# Patient Record
Sex: Male | Born: 1950 | Race: White | Hispanic: No | Marital: Married | Smoking: Former smoker
Health system: Southern US, Community
[De-identification: ages and names within clinical notes are randomized; demographics above are authoritative.]

## PROBLEM LIST (undated history)

## (undated) DIAGNOSIS — E119 Type 2 diabetes mellitus without complications: Secondary | ICD-10-CM

## (undated) DIAGNOSIS — E6609 Other obesity due to excess calories: Secondary | ICD-10-CM

## (undated) DIAGNOSIS — E785 Hyperlipidemia, unspecified: Secondary | ICD-10-CM

## (undated) DIAGNOSIS — N289 Disorder of kidney and ureter, unspecified: Secondary | ICD-10-CM

## (undated) DIAGNOSIS — I2510 Atherosclerotic heart disease of native coronary artery without angina pectoris: Secondary | ICD-10-CM

## (undated) DIAGNOSIS — F32A Depression, unspecified: Secondary | ICD-10-CM

## (undated) DIAGNOSIS — I10 Essential (primary) hypertension: Secondary | ICD-10-CM

## (undated) DIAGNOSIS — J309 Allergic rhinitis, unspecified: Secondary | ICD-10-CM

## (undated) DIAGNOSIS — Z125 Encounter for screening for malignant neoplasm of prostate: Secondary | ICD-10-CM

## (undated) DIAGNOSIS — I480 Paroxysmal atrial fibrillation: Secondary | ICD-10-CM

## (undated) DIAGNOSIS — F329 Major depressive disorder, single episode, unspecified: Secondary | ICD-10-CM

## (undated) HISTORY — PX: APPENDECTOMY: SHX54

## (undated) HISTORY — DX: Type 2 diabetes mellitus without complications: E11.9

## (undated) HISTORY — DX: Disorder of kidney and ureter, unspecified: N28.9

## (undated) HISTORY — DX: Essential (primary) hypertension: I10

## (undated) HISTORY — DX: Other obesity due to excess calories: E66.09

## (undated) HISTORY — DX: Hyperlipidemia, unspecified: E78.5

## (undated) HISTORY — DX: Atherosclerotic heart disease of native coronary artery without angina pectoris: I25.10

## (undated) HISTORY — DX: Encounter for screening for malignant neoplasm of prostate: Z12.5

## (undated) HISTORY — DX: Major depressive disorder, single episode, unspecified: F32.9

## (undated) HISTORY — PX: COLONOSCOPY WITH ESOPHAGOGASTRODUODENOSCOPY (EGD): SHX5779

## (undated) HISTORY — DX: Allergic rhinitis, unspecified: J30.9

## (undated) HISTORY — DX: Depression, unspecified: F32.A

---

## 2004-05-16 HISTORY — PX: LEFT HEART CATH: SHX5946

## 2004-12-27 ENCOUNTER — Ambulatory Visit (HOSPITAL_COMMUNITY): Admission: RE | Admit: 2004-12-27 | Discharge: 2004-12-27 | Payer: Self-pay | Admitting: Gastroenterology

## 2004-12-27 ENCOUNTER — Encounter (INDEPENDENT_AMBULATORY_CARE_PROVIDER_SITE_OTHER): Payer: Self-pay | Admitting: Specialist

## 2008-10-31 ENCOUNTER — Ambulatory Visit: Payer: Self-pay | Admitting: Cardiology

## 2008-10-31 ENCOUNTER — Observation Stay (HOSPITAL_COMMUNITY): Admission: EM | Admit: 2008-10-31 | Discharge: 2008-11-04 | Payer: Self-pay | Admitting: Emergency Medicine

## 2008-11-25 DIAGNOSIS — I82409 Acute embolism and thrombosis of unspecified deep veins of unspecified lower extremity: Secondary | ICD-10-CM | POA: Insufficient documentation

## 2008-11-25 DIAGNOSIS — I2510 Atherosclerotic heart disease of native coronary artery without angina pectoris: Secondary | ICD-10-CM | POA: Insufficient documentation

## 2008-11-25 DIAGNOSIS — F329 Major depressive disorder, single episode, unspecified: Secondary | ICD-10-CM

## 2008-11-25 DIAGNOSIS — I10 Essential (primary) hypertension: Secondary | ICD-10-CM | POA: Insufficient documentation

## 2008-11-25 DIAGNOSIS — E669 Obesity, unspecified: Secondary | ICD-10-CM | POA: Insufficient documentation

## 2008-11-25 DIAGNOSIS — E785 Hyperlipidemia, unspecified: Secondary | ICD-10-CM

## 2008-11-27 ENCOUNTER — Ambulatory Visit: Payer: Self-pay | Admitting: Cardiology

## 2008-11-27 ENCOUNTER — Encounter (INDEPENDENT_AMBULATORY_CARE_PROVIDER_SITE_OTHER): Payer: Self-pay | Admitting: *Deleted

## 2008-12-15 ENCOUNTER — Ambulatory Visit: Payer: Self-pay | Admitting: Cardiology

## 2008-12-16 ENCOUNTER — Encounter (INDEPENDENT_AMBULATORY_CARE_PROVIDER_SITE_OTHER): Payer: Self-pay | Admitting: *Deleted

## 2008-12-16 LAB — CONVERTED CEMR LAB
BUN: 15 mg/dL (ref 6–23)
Bilirubin, Direct: 0 mg/dL (ref 0.0–0.3)
Chloride: 107 meq/L (ref 96–112)
Glucose, Bld: 123 mg/dL — ABNORMAL HIGH (ref 70–99)
HDL: 25.6 mg/dL — ABNORMAL LOW (ref >39.00)
Potassium: 4.1 meq/L (ref 3.5–5.1)
Total Bilirubin: 0.8 mg/dL (ref 0.3–1.2)
VLDL: 55 mg/dL — ABNORMAL HIGH (ref 0.0–40.0)

## 2009-01-14 ENCOUNTER — Telehealth: Payer: Self-pay | Admitting: Cardiology

## 2009-02-23 ENCOUNTER — Telehealth: Payer: Self-pay | Admitting: Cardiology

## 2009-05-20 ENCOUNTER — Ambulatory Visit: Payer: Self-pay | Admitting: Cardiology

## 2009-05-20 DIAGNOSIS — R0989 Other specified symptoms and signs involving the circulatory and respiratory systems: Secondary | ICD-10-CM | POA: Insufficient documentation

## 2009-05-20 DIAGNOSIS — F17200 Nicotine dependence, unspecified, uncomplicated: Secondary | ICD-10-CM | POA: Insufficient documentation

## 2009-05-25 ENCOUNTER — Encounter: Payer: Self-pay | Admitting: Cardiovascular Disease

## 2009-05-25 ENCOUNTER — Ambulatory Visit: Payer: Self-pay

## 2009-12-02 ENCOUNTER — Ambulatory Visit: Payer: Self-pay | Admitting: Cardiology

## 2009-12-08 ENCOUNTER — Encounter (INDEPENDENT_AMBULATORY_CARE_PROVIDER_SITE_OTHER): Payer: Self-pay | Admitting: *Deleted

## 2010-06-13 LAB — CONVERTED CEMR LAB
BUN: 27 mg/dL — ABNORMAL HIGH (ref 6–23)
Bilirubin, Direct: 0.1 mg/dL (ref 0.0–0.3)
Chloride: 108 meq/L (ref 96–112)
Direct LDL: 45.5 mg/dL
Glucose, Bld: 88 mg/dL (ref 70–99)
HDL: 20.5 mg/dL — ABNORMAL LOW (ref >39.00)
Potassium: 4.2 meq/L (ref 3.5–5.1)
Total Bilirubin: 0.4 mg/dL (ref 0.3–1.2)
VLDL: 58.2 mg/dL — ABNORMAL HIGH (ref 0.0–40.0)

## 2010-06-16 NOTE — Assessment & Plan Note (Signed)
Summary: per check out/sf   History of Present Illness: Pleasant gentleman seen in June of 2010 at [facility] with chest pain. Cardiac catheterization revealed normal LV function. There was significant coronary disease and he had PCI of his posterior lateral and right coronary artery. He also had PCI of his PDA. Note all of these were drug-eluting stents. I last saw him in July of 2010. Since then the patient denies any dyspnea on exertion, orthopnea, PND, pedal edema, palpitations, syncope or chest pain.   Current Medications (verified): 1)  Lopid 600 Mg Tabs (Gemfibrozil) .Marland Kitchen.. 1 Tab Two Times A Day 2)  Effexor Xr 75 Mg Xr24h-Cap (Venlafaxine Hcl) .Marland Kitchen.. 1 Tab Once Daily 3)  Benazepril Hcl 40 Mg Tabs (Benazepril Hcl) .Marland Kitchen.. 1 Tab Once Daily 4)  Plavix 75 Mg Tabs (Clopidogrel Bisulfate) .Marland Kitchen.. 1 Tab Once Daily 5)  Aspirin Ec 325 Mg Tbec (Aspirin) .... Take One Tablet By Mouth Daily 6)  Crestor 20 Mg Tabs (Rosuvastatin Calcium) .Marland Kitchen.. 1 Tab At Bedtime 7)  Nexium 40 Mg Cpdr (Esomeprazole Magnesium) .Marland Kitchen.. 1 Tab By Mouth Once Daily 8)  Claritin 10 Mg Tabs (Loratadine) .... As Needed  Allergies: No Known Drug Allergies  Past History:  Past Medical History: Current Problems:  HYPERTENSION (ICD-401.9) CAD (ICD-414.00) DEPRESSION (ICD-311) DYSLIPIDEMIA (ICD-272.4) OBESITY (ICD-278.00)  Past Surgical History: Reviewed history from 11/27/2008 and no changes required. Appendectomy  Social History: Reviewed history from 11/27/2008 and no changes required.  He is employed, lives at home with his wife.  He is a  smoker.  Occasional  alcohol.   Review of Systems       no fevers or chills, productive cough, hemoptysis, dysphasia, odynophagia, melena, hematochezia, dysuria, hematuria, rash, seizure activity, orthopnea, PND, pedal edema, claudication. Remaining systems are negative.   Vital Signs:  Patient profile:   60 year old male Height:      63 inches Weight:      200 pounds BMI:      35.56 Pulse rate:   80 / minute Resp:     12 per minute BP sitting:   94 / 51  (left arm)  Vitals Entered By: Kem Parkinson (May 20, 2009 9:22 AM)  Physical Exam  General:  Well-developed well-nourished in no acute distress.  Skin is warm and dry.  HEENT is normal.  Neck is supple. No thyromegaly.  Chest is clear to auscultation with normal expansion.  Cardiovascular exam is regular rate and rhythm.  Abdominal exam nontender or distended. No masses palpated. positive bruit Extremities show no edema. neuro grossly intact    EKG  Procedure date:  05/20/2009  Findings:      Sinus rhythm at a rate of 76. Axis normal. No ST changes.  Impression & Recommendations:  Problem # 1:  ABDOMINAL BRUIT (ICD-785.9) Check abdominal ultrasound to exclude aneurysm. Orders: Abdominal Aorta Duplex (Abd Aorta Duplex)  Problem # 2:  HYPERTENSION (ICD-401.9) Blood pressure controlled on present medications. Will continue. His updated medication list for this problem includes:    Benazepril Hcl 40 Mg Tabs (Benazepril hcl) .Marland Kitchen... 1 tab once daily    Aspirin Ec 325 Mg Tbec (Aspirin) .Marland Kitchen... Take one tablet by mouth daily  Problem # 3:  CAD (ICD-414.00) Continue aspirin, Plavix, ACE inhibitor and statin. His updated medication list for this problem includes:    Benazepril Hcl 40 Mg Tabs (Benazepril hcl) .Marland Kitchen... 1 tab once daily    Plavix 75 Mg Tabs (Clopidogrel bisulfate) .Marland Kitchen... 1 tab once daily  Aspirin Ec 325 Mg Tbec (Aspirin) .Marland Kitchen... Take one tablet by mouth daily  His updated medication list for this problem includes:    Benazepril Hcl 40 Mg Tabs (Benazepril hcl) .Marland Kitchen... 1 tab once daily    Plavix 75 Mg Tabs (Clopidogrel bisulfate) .Marland Kitchen... 1 tab once daily    Aspirin Ec 325 Mg Tbec (Aspirin) .Marland Kitchen... Take one tablet by mouth daily  Problem # 4:  DYSLIPIDEMIA (ICD-272.4) Continue statin. His updated medication list for this problem includes:    Lopid 600 Mg Tabs (Gemfibrozil) .Marland Kitchen... 1 tab  two times a day    Crestor 20 Mg Tabs (Rosuvastatin calcium) .Marland Kitchen... 1 tab at bedtime  His updated medication list for this problem includes:    Lopid 600 Mg Tabs (Gemfibrozil) .Marland Kitchen... 1 tab two times a day    Crestor 20 Mg Tabs (Rosuvastatin calcium) .Marland Kitchen... 1 tab at bedtime  Problem # 5:  TOBACCO ABUSE (ICD-305.1) Patient counseled on discontinuing for between 3-10 minutes.  Patient Instructions: 1)  Your physician recommends that you schedule a follow-up appointment in: 6 MONTHS 2)  Your physician has requested that you have an abdominal aorta duplex. During this test, an ultrasound is used to evaluate the aorta. Allow 30 minutes for this exam. Do not eat after midnight the day before and avoid carbonated beverages. There are no restrictions or special instructions.

## 2010-06-16 NOTE — Letter (Signed)
Summary: Custom - Lipid  [facility], Main Office  [address]   [place], [place] [address]   Phone: [phone]  Fax: [phone]     December 08, 2009 MRN: [id number]   Gary Keller [address] [place], [place]  [address]   Dear [name],  We have reviewed your cholesterol results.  They are as follows:     Total Cholesterol:    108 (Desirable: less than 200)       HDL  Cholesterol:     20.50  (Desirable: greater than 40 for men and 50 for women)       LDL Cholesterol:    45.5     (Desirable: less than 100 for low risk and less than 70 for moderate to high risk)       Triglycerides:       291.0  (Desirable: less than 150)  Our recommendations include:These numbers look good. Continue on the same medicine. Sodium, potassium, kidney and  Liver function are normal. Take care, Dr. Darel Hong.    Call our office at the number listed above if you have any questions.  Lowering your LDL cholesterol is important, but it is only one of a large number of "risk factors" that may indicate that you are at risk for heart disease, stroke or other complications of hardening of the arteries.  Other risk factors include:   A.  Cigarette Smoking* B.  High Blood Pressure* C.  Obesity* D.   Low HDL Cholesterol (see yours above)* E.   Diabetes Mellitus (higher risk if your is uncontrolled) F.  Family history of premature heart disease G.  Previous history of stroke or cardiovascular disease    *These are risk factors YOU HAVE CONTROL OVER.  For more information, visit .  There is now evidence that lowering the TOTAL CHOLESTEROL AND LDL CHOLESTEROL can reduce the risk of heart disease.  The American Heart Association recommends the following guidelines for the treatment of elevated cholesterol:  1.  If there is now current heart disease and less than two risk factors, TOTAL CHOLESTEROL should be less than 200 and LDL CHOLESTEROL should be less than 100. 2.  If there is  current heart disease or two or more risk factors, TOTAL CHOLESTEROL should be less than 200 and LDL CHOLESTEROL should be less than 70.  A diet low in cholesterol, saturated fat, and calories is the cornerstone of treatment for elevated cholesterol.  Cessation of smoking and exercise are also important in the management of elevated cholesterol and preventing vascular disease.  Studies have shown that 30 to 60 minutes of physical activity most days can help lower blood pressure, lower cholesterol, and keep your weight at a healthy level.  Drug therapy is used when cholesterol levels do not respond to therapeutic lifestyle changes (smoking cessation, diet, and exercise) and remains unacceptably high.  If medication is started, it is important to have you levels checked periodically to evaluate the need for further treatment options.  Thank you,    [employer] Team

## 2010-06-16 NOTE — Assessment & Plan Note (Signed)
Summary: 6 month rov./sl   Primary Provider:  Dr. Micki Riley  CC:  check up.  History of Present Illness: Pleasant gentleman previously seen in June of 2010 at [facility] with chest pain. Cardiac catheterization revealed normal LV function. There was significant coronary disease and he had PCI of his posterior lateral and right coronary artery. He also had PCI of his PDA. Note all of these were drug-eluting stents. Abdominal ultrasound in January of 2011 showed no aneurysm. I last saw him in Jan 2011. Since then the patient denies any dyspnea on exertion, orthopnea, PND, pedal edema, palpitations, syncope or chest pain.   Current Medications (verified): 1)  Lopid 600 Mg Tabs (Gemfibrozil) .Marland Kitchen.. 1 Tab Two Times A Day 2)  Effexor Xr 75 Mg Xr24h-Cap (Venlafaxine Hcl) .Marland Kitchen.. 1 Tab Once Daily 3)  Benazepril Hcl 40 Mg Tabs (Benazepril Hcl) .Marland Kitchen.. 1 Tab Once Daily 4)  Plavix 75 Mg Tabs (Clopidogrel Bisulfate) .Marland Kitchen.. 1 Tab Once Daily 5)  Aspirin Ec 325 Mg Tbec (Aspirin) .... Take One Tablet By Mouth Daily 6)  Crestor 20 Mg Tabs (Rosuvastatin Calcium) .Marland Kitchen.. 1 Tab At Bedtime 7)  Nexium 40 Mg Cpdr (Esomeprazole Magnesium) .Marland Kitchen.. 1 Tab By Mouth Once Daily 8)  Claritin 10 Mg Tabs (Loratadine) .... As Needed  Allergies: No Known Drug Allergies  Past History:  Past Medical History: HYPERTENSION (ICD-401.9) CAD (ICD-414.00) DEPRESSION (ICD-311) DYSLIPIDEMIA (ICD-272.4) OBESITY (ICD-278.00)  Past Surgical History: Reviewed history from 11/27/2008 and no changes required. Appendectomy  Social History: Reviewed history from 05/20/2009 and no changes required.  He is employed, lives at home with his wife.  He is a former smoker.  Occasional  alcohol.   Review of Systems       Arthritis in his hands but no fevers or chills, productive cough, hemoptysis, dysphasia, odynophagia, melena, hematochezia, dysuria, hematuria, rash, seizure activity, orthopnea, PND, pedal edema, claudication. Remaining  systems are negative.   Vital Signs:  Patient profile:   60 year old male Height:      63 inches Weight:      195 pounds BMI:     34.67 Pulse rate:   80 / minute Resp:     12 per minute BP sitting:   124 / 74  (left arm)  Vitals Entered By: Kem Parkinson (December 02, 2009 8:42 AM)  Physical Exam  General:  Well-developed well-nourished in no acute distress.  Skin is warm and dry.  HEENT is normal.  Neck is supple. No thyromegaly.  Chest is clear to auscultation with normal expansion.  Cardiovascular exam is regular rate and rhythm.  Abdominal exam nontender or distended. No masses palpated. Extremities show no edema. neuro grossly intact    EKG  Procedure date:  12/02/2009  Findings:      Normal sinus rhythm at a rate of 80. Axis normal. No ST changes.  Impression & Recommendations:  Problem # 1:  TOBACCO ABUSE (ICD-305.1) Patient has discontinued this.  Problem # 2:  HYPERTENSION (ICD-401.9) Blood pressure controlled on present medications. Will continue. Check renal function and potassium. His updated medication list for this problem includes:    Benazepril Hcl 40 Mg Tabs (Benazepril hcl) .Marland Kitchen... 1 tab once daily    Aspirin Ec 325 Mg Tbec (Aspirin) .Marland Kitchen... Take one tablet by mouth daily  Orders: EKG w/ Interpretation (93000) TLB-Lipid Panel (80061-LIPID) TLB-Hepatic/Liver Function Pnl (80076-HEPATIC) TLB-BMP (Basic Metabolic Panel-BMET) (80048-METABOL)  Problem # 3:  CAD (ICD-414.00)  Continue aspirin, Plavix, ACE inhibitor and statin. Continue risk  factor modification including diet and exercise. His updated medication list for this problem includes:    Benazepril Hcl 40 Mg Tabs (Benazepril hcl) .Marland Kitchen... 1 tab once daily    Plavix 75 Mg Tabs (Clopidogrel bisulfate) .Marland Kitchen... 1 tab once daily    Aspirin Ec 325 Mg Tbec (Aspirin) .Marland Kitchen... Take one tablet by mouth daily  Orders: EKG w/ Interpretation (93000) TLB-Lipid Panel (80061-LIPID) TLB-Hepatic/Liver Function Pnl  (80076-HEPATIC) TLB-BMP (Basic Metabolic Panel-BMET) (80048-METABOL)  His updated medication list for this problem includes:    Benazepril Hcl 40 Mg Tabs (Benazepril hcl) .Marland Kitchen... 1 tab once daily    Plavix 75 Mg Tabs (Clopidogrel bisulfate) .Marland Kitchen... 1 tab once daily    Aspirin Ec 325 Mg Tbec (Aspirin) .Marland Kitchen... Take one tablet by mouth daily  Problem # 4:  DYSLIPIDEMIA (ICD-272.4)  Continue present medications. Check lipids and liver. His updated medication list for this problem includes:    Lopid 600 Mg Tabs (Gemfibrozil) .Marland Kitchen... 1 tab two times a day    Crestor 20 Mg Tabs (Rosuvastatin calcium) .Marland Kitchen... 1 tab at bedtime  Orders: EKG w/ Interpretation (93000) TLB-Lipid Panel (80061-LIPID) TLB-Hepatic/Liver Function Pnl (80076-HEPATIC) TLB-BMP (Basic Metabolic Panel-BMET) (80048-METABOL)  His updated medication list for this problem includes:    Lopid 600 Mg Tabs (Gemfibrozil) .Marland Kitchen... 1 tab two times a day    Crestor 20 Mg Tabs (Rosuvastatin calcium) .Marland Kitchen... 1 tab at bedtime  Patient Instructions: 1)  Your physician recommends that you schedule a follow-up appointment in: 1 year with Dr. Jens Som 2)  Your physician recommends that you have a FASTING lipid profile today. Also, BMP, liver test 3)  Your physician recommends that you continue on your current medications as directed. Please refer to the Current Medication list given to you today.

## 2010-08-23 LAB — CBC
HCT: 40.3 % (ref 39.0–52.0)
HCT: 43.2 % (ref 39.0–52.0)
Hemoglobin: 13.8 g/dL (ref 13.0–17.0)
Hemoglobin: 14.9 g/dL (ref 13.0–17.0)
MCHC: 36 g/dL (ref 30.0–36.0)
MCV: 89.9 fL (ref 78.0–100.0)
Platelets: 182 10*3/uL (ref 150–400)
RBC: 4.33 MIL/uL (ref 4.22–5.81)
RBC: 4.49 MIL/uL (ref 4.22–5.81)
RDW: 12.5 % (ref 11.5–15.5)
RDW: 12.9 % (ref 11.5–15.5)
WBC: 4.9 10*3/uL (ref 4.0–10.5)
WBC: 5.3 10*3/uL (ref 4.0–10.5)

## 2010-08-23 LAB — LIPID PANEL
HDL: 21 mg/dL — ABNORMAL LOW (ref >39)
Total CHOL/HDL Ratio: 8.6 RATIO
Triglycerides: 451 mg/dL — ABNORMAL HIGH (ref <150)

## 2010-08-23 LAB — TROPONIN I: Troponin I: 0.02 ng/mL (ref 0.00–0.06)

## 2010-08-23 LAB — CK TOTAL AND CKMB (NOT AT ARMC)
CK, MB: 1.5 ng/mL (ref 0.3–4.0)
Relative Index: 0.8 (ref 0.0–2.5)
Total CK: 187 U/L (ref 7–232)

## 2010-08-23 LAB — DIFFERENTIAL
Basophils Absolute: 0 10*3/uL (ref 0.0–0.1)
Eosinophils Relative: 1 % (ref 0–5)
Lymphocytes Relative: 34 % (ref 12–46)
Monocytes Absolute: 0.5 10*3/uL (ref 0.1–1.0)
Monocytes Relative: 7 % (ref 3–12)

## 2010-08-23 LAB — BASIC METABOLIC PANEL
BUN: 23 mg/dL (ref 6–23)
CO2: 29 mEq/L (ref 19–32)
CO2: 29 mEq/L (ref 19–32)
CO2: 30 mEq/L (ref 19–32)
Calcium: 8.8 mg/dL (ref 8.4–10.5)
Calcium: 8.9 mg/dL (ref 8.4–10.5)
Chloride: 104 mEq/L (ref 96–112)
Chloride: 106 mEq/L (ref 96–112)
Chloride: 108 mEq/L (ref 96–112)
Creatinine, Ser: 1.4 mg/dL (ref 0.4–1.5)
Creatinine, Ser: 1.43 mg/dL (ref 0.4–1.5)
Creatinine, Ser: 1.58 mg/dL — ABNORMAL HIGH (ref 0.4–1.5)
GFR calc Af Amer: >60 mL/min (ref >60)
GFR calc Af Amer: >60 mL/min (ref >60)
GFR calc Af Amer: >60 mL/min (ref >60)
GFR calc non Af Amer: 55 mL/min — ABNORMAL LOW (ref >60)
Glucose, Bld: 143 mg/dL — ABNORMAL HIGH (ref 70–99)
Glucose, Bld: 95 mg/dL (ref 70–99)
Potassium: 4.4 mEq/L (ref 3.5–5.1)
Potassium: 4.6 mEq/L (ref 3.5–5.1)
Sodium: 140 mEq/L (ref 135–145)
Sodium: 140 mEq/L (ref 135–145)

## 2010-08-23 LAB — POCT I-STAT, CHEM 8
BUN: 22 mg/dL (ref 6–23)
Calcium, Ion: 1.01 mmol/L — ABNORMAL LOW (ref 1.12–1.32)
Creatinine, Ser: 1.6 mg/dL — ABNORMAL HIGH (ref 0.4–1.5)
Glucose, Bld: 83 mg/dL (ref 70–99)
TCO2: 22 mmol/L (ref 0–100)

## 2010-08-23 LAB — CARDIAC PANEL(CRET KIN+CKTOT+MB+TROPI)
Relative Index: 1 (ref 0.0–2.5)
Total CK: 126 U/L (ref 7–232)
Troponin I: 0.02 ng/mL (ref 0.00–0.06)

## 2010-08-23 LAB — PROTIME-INR: INR: 1 (ref 0.00–1.49)

## 2010-08-23 LAB — POCT CARDIAC MARKERS

## 2010-08-23 LAB — APTT: aPTT: 26 seconds (ref 24–37)

## 2010-09-28 NOTE — H&P (Signed)
NAMEMARKAS, [name]                 ACCOUNT NO.:  [id number]   MEDICAL RECORD NO.:  [id number]          PATIENT TYPE:  INP   LOCATION:  3738                         FACILITY:  [facility]   PHYSICIAN:  Zannie Cove, MD     DATE OF BIRTH:  [birth year]   DATE OF ADMISSION:  10/31/2008  DATE OF DISCHARGE:                              HISTORY & PHYSICAL   PRIMARY CARE PHYSICIAN:  Marjory Lies, MD   CHIEF COMPLAINT:  Chest pressure.   HISTORY OF PRESENTING ILLNESS:  Mr. Gary Keller is a 60 year old white male  who presents to the emergency department with complaints of chest  pressure noticed upon waking up this morning.  He says the pressure was  severe, almost like something sitting or pushing on his chest, located  in the middle of his chest radiating to his jaw and middle of his back,  also associated with some left arm tingling.  In addition to the chest  pressure, he had severe nausea and sweating at the time, all this  lasting for several seconds.  The patient and wife report that he  experienced similar chest pressure last week although less than 10,  which resolved by itself.  He denies any cough, shortness of breath,  fever, or chills.  He does not report any heartburn-like symptoms, and  the pressure is not reproducible.   PAST MEDICAL HISTORY:  1. Hypertension.  2. Dyslipidemia.  3. Depression.   PAST SURGICAL HISTORY:  None.   MEDICATIONS:  1. Benazepril 40 once a day.  2. Effexor 75 once a day.  3. Lopid 600 twice a day.  4. Zantac 150 as needed.   ALLERGIES:  No known drug allergies.   SOCIAL HISTORY:  He is employed, lives at home with his wife.  He is a  smoker.  Denies any alcohol.   FAMILY HISTORY:  Significant for premature coronary disease in both his  brothers in their late 55s as well as early 39s in addition to father  where he had 9 myocardiac infarctions.   REVIEW OF SYSTEMS:  A 12-system review negative except per HPI.   PHYSICAL EXAMINATION:  VITAL  SIGNS:  Temp is 98.2, pulse is 89, blood  pressure 139/81, respirations 16, and sating 97% on 2 L.  GENERAL:  Alert, awake, and oriented x3.  HEENT:  Pupils equal and reactive to light.  Extraocular movements  intact.  NECK:  No JVD.  CARDIOVASCULAR:  S1 and S2.  Regular rate and rhythm.  No murmurs, rubs,  or gallops.  LUNGS:  Clear to auscultation.  In the upper lung field, scant fine  crackles at the both bases.  ABDOMEN:  Soft, nontender.  Positive bowel sounds.  EXTREMITIES:  No edema, clubbing, or cyanosis.  NEUROLOGIC:  Nonfocal.   CBC is normal.  Chemistry with sodium of 138, potassium 4.3, chloride  109, bicarb 22, BUN 23, creatinine 1.6, glucose 83, INR 1.01.  CK-MB  less than 1, and troponin less than 0.05.  EKG is normal sinus rhythm  with rate of 87, no acute ST-T changes.   ASSESSMENT  AND PLAN:  A 60 year old gentleman with:  1. Acute coronary syndrome.  The patient presenting with chest      pressure, currently resolved.  We will check 2 more sets of cardiac      enzymes.  Get an echocardiogram, monitor him on telemetry      overnight.  Start aspirin 325 mg once a day and Lipitor 40.  We      will also check fasting lipid panel.  If he rules out for MI, he      will definitely need a stress test prior to discharge.  In      addition, the patient counseled on smoking cessation.  2. Dyslipidemia.  Check lipid panel, start statin.  3. Hypertension, stable.  Continue benazepril.  4. Deep venous thrombosis prophylaxis, Lovenox.  5. Code status.  The patient is full code.      Zannie Cove, MD  Electronically Signed     PJ/MEDQ  D:  10/31/2008  T:  11/01/2008  Job:  [id number]   cc:   Marjory Lies, M.D.

## 2010-09-28 NOTE — Discharge Summary (Signed)
NAMEJOHNATON, SONNEBORN                 ACCOUNT NO.:  [id number]   MEDICAL RECORD NO.:  [id number]          PATIENT TYPE:  INP   LOCATION:  2506                         FACILITY:  [facility]   PHYSICIAN:  Ruthy Dick, MD    DATE OF BIRTH:  1950-07-13   DATE OF ADMISSION:  10/31/2008  DATE OF DISCHARGE:  11/04/2008                               DISCHARGE SUMMARY   REASON FOR ADMISSION:  Chest pressure.   FINAL DISCHARGE DIAGNOSES:  1. Chest pressure, resolved.  2. Coronary artery disease status post angioplasty with 3 stents      placed.  3. Acute renal insufficiency, resolved.  4. Hypertension.  5. Tobacco abuse.  6. Obesity.  7. Dyslipidemia.  8. Left sided numbness (PCP to follow up).   CONSULT DURING THIS ADMISSION:  Cardiology consult.   PROCEDURES DONE DURING THIS ADMISSION:  Left heart catheterization with  findings of 90% occlusion in the proximal right coronary artery and also  in the descending branch of the right coronary artery.  There was also  noted 90% occlusion of the left circumflex artery.  All three lesions  were stented from 90% to 0%.   This morning, the patient says that he has some left arm numbness, which  has not fully resolved postprocedure.  I watched his left arm numbness  and discussed it with his primary care physician, Dr. Marjory Lies and  further workup can be done in the outpatient setting.  He has no  complaints, no chest pain today, no shortness of breath, no abdominal  pain, no nausea, no vomiting, no diarrhea, no constipation.   PHYSICAL EXAMINATION:  VITAL SIGNS:  His vitals are stable with a  temperature of 98.5, pulse 79, respiration 20, blood pressure 131/77,  saturating 97% on room air.  CHEST:  Clear to auscultation bilateral.  ABDOMEN:  Soft, nontender.  EXTREMITIES:  No clubbing, no cyanosis, no edema.  CARDIOVASCULAR:  First and second heart sounds only.  CENTRAL NERVOUS SYSTEM:  Nonfocal.   The patient is to follow up with Dr.  Marjory Lies in 1-2 weeks and the  patient needs to call for this appointment.  He is also to follow up  with Dr. Jens Som in 2 weeks and this appointment is going to be  arranged by the [facility] group.  I have discussed this patient  with Dr. Juanda Chance and he agrees to our plan of care.   The patient is to go home on the following medications;  1. Benazepril 40 mg daily.  2. Effexor 75 mg daily.  3. Lopid 600 mg b.i.d.  4. Zantac 150 mg b.i.d.  5. Aspirin enteric-coated 325 mg p.o. daily.  6. Plavix 75 mg p.o. daily.  7. Crestor 20 mg p.o. daily.   Time used for discharge planning is greater than 30 minutes.  I have  discussed with the patient his insurance issues, and he tells me that he  has an insurance and it should be able to afford paying for his Plavix,  made it clear to him that he cannot afford being off Plavix as he  can  have a restenosis of his stents and have a heart attack.  He voiced his  understanding.      Ruthy Dick, MD  Electronically Signed     GU/MEDQ  D:  11/04/2008  T:  11/05/2008  Job:  [id number]   cc:   Madolyn Frieze. Jens Som, MD, [facility]  Bruce R. Juanda Chance, MD, [facility]  Marjory Lies, M.D.

## 2010-09-28 NOTE — Cardiovascular Report (Signed)
Gary Keller, Gary Keller                 ACCOUNT NO.:  [id number]   MEDICAL RECORD NO.:  [id number]          PATIENT TYPE:  INP   LOCATION:  2506                         FACILITY:  [facility]   PHYSICIAN:  Everardo Beals. Juanda Chance, MD, FACCDATE OF BIRTH:  02/01/1951   DATE OF PROCEDURE:  11/03/2008  DATE OF DISCHARGE:                            CARDIAC CATHETERIZATION   HISTORY:  Mr. Raineri is a 60 year old with no prior history of known  heart disease although does have hypertension, hyperlipidemia, and is a  smoker and has a family history of coronary disease.  He was admitted  with chest, jaw, and arm pain suspicious for unstable angina.  His  markers were negative.   PROCEDURE:  The procedure was performed via the right femoral artery  using the arterial sheath and 5-French preformed coronary catheters.  A  front-wall arterial puncture was performed and Omnipaque contrast was  used.  After completion of diagnostic study, we made a decision to  proceed with intervention on the right coronary lesion and then the  circumflex lesion.   The patient was given Angiomax bolus infusion.  He was given 600 mg  Plavix and 12 mg of Pepcid.  He had previously been given 4 chewable  aspirin.  We used a JR-4 6-French guiding catheter with side holes for  the right coronary intervention.  We passed a PT2 light support wire  down across the lesion in the proximal right coronary artery and the  posterior descending branch in the right coronary artery without  difficulty.  We predilated both lesions with a 2.25 x 15 mm apex  balloon.  We then stented the lesion in the posterior descending branch  with a 2.5 x 12 mm Xience stent.  The proximal edge of the stent was  just inside the ostium of the PDA.  We postdilated with a 2.75 x 8-mm [place]  apex.   We then approached the proximal right coronary artery.  This had been  previously predilated.  We then deployed a 3.5 x 15 mm Xience stent  performing this with one  inflation of 14 atmospheres for 30 seconds.  We  postdilated with a 3.75 x 12 mm [place] Voyager performing one inflation of  16 atmospheres for 30 seconds.   We then approached the circumflex posterolateral branch.  This was an  ostial lesion with a sharp takeoff from the AV circumflex artery, so we  attempted to try and treat this with a cutting balloon.  We opened it  first with a 2.0 x 20 mm apex and performed one inflation up to 10  atmospheres for 30 seconds.  We then went in with a 2.25 x 15 mm cutting  balloon and performed a total of 7 inflations up to 8 atmospheres for 30  seconds.  This resulted in dissection in the vessel.  With a suboptimal  result, we felt we need to stent this.  We went in with a 2.5 x 23 mm  Xience stent and deployed this with one inflation of 12 atmospheres for  30 seconds.  The proximal edge of the  stent was just inside the ostium.  We postdilated with a 2.5 x 20 mm [place] apex performing one inflation to 16  atmospheres for 30 seconds.  Final diagnostic studies were then  performed through the guiding catheter.  The patient tolerated the  procedure well and left the laboratory in satisfactory condition.  The  right femoral artery was closed with Angio-Seal at the end of the  procedure.   RESULTS:  Left main coronary artery:  The left main coronary artery was  free of significant disease.   Left anterior descending artery:  The left anterior descending artery  gave rise to three diagonal branches and two septal perforators.  This  vessel was irregular but there was no major obstruction.   Circumflex artery:  The circumflex artery gave rise to two small  marginal branches, a large posterolateral branch, and a second moderate-  sized posterolateral branch.  There was 90% stenosis at the ostium of  the first posterolateral branch.  There was 70-80% narrowing in the  distal circumflex artery just before the second smaller posterolateral  branch.   Right  coronary artery:  The right coronary artery is a dominant vessel  that gave rise to right ventricular branch, posterior descending branch,  and a posterolateral branch.  There was 90% stenosis in the proximal  vessel.  There was 50% narrowing in the mid-to-distal vessel.  There was  95% stenosis near the ostium of the posterior descending branch.   Left ventriculogram:  The left ventriculogram performed in the RAO  projection showed good wall motion with no areas of hypokinesis.   Following stenting of the lesion in the posterior branch, the right  coronary stenosis improved from 95% to 0%.   Following stenting of the lesion in the proximal right coronary, the  stenosis improved from 90% to 0%.   Following stenting of the lesion in the first posterolateral branch of  the circumflex, the stenosis improved from 90% to 0%.  The AV circumflex  was pinched to about 40-50%.   The aortic pressure was 119/76 with mean of 94.  Left ventricular  pressure was 119/10.   CONCLUSION:  1. Coronary artery disease with irregularities in the left anterior      descending, 90% narrowing in the first posterolateral branch and 70-      80% narrowing in the second posterolateral branch of circumflex      artery, 90% stenosis in the proximal right coronary with      50%narrowing in the mid distal vessel and 95% stenosis in the      posterior descending branch and normal left ventricular function.  2. Successful percutaneous coronary intervention of the lesion in the      posterior descending branch of the right coronary using a Xience      drug-eluting stent with improvement in central narrowing from 95%      to 0%.  3. Successful percutaneous coronary intervention of the lesion in the      proximal right coronary using a Xience drug-eluting stent with      improvement of central narrowing from 90% to 0%.  4. Successful percutaneous coronary intervention of the ostial lesion      in the first  posterolateral branch of the circumflex artery with      improvement in central narrowing from 90% to 0% using a Xience drug-      eluting stent.   RECOMMENDATIONS:  The patient returned to Angioplasty Unit for further  observation.  We will plan long-term Plavix.      Bruce Elvera Lennox Juanda Chance, MD, [facility]  Electronically Signed     BRB/MEDQ  D:  11/03/2008  T:  11/04/2008  Job:  [id number]   cc:   Marjory Lies, M.D.  Madolyn Frieze Jens Som, MD, [facility]

## 2010-09-28 NOTE — Consult Note (Signed)
Gary Keller, Gary Keller                 ACCOUNT NO.:  [id number]   MEDICAL RECORD NO.:  [id number]          PATIENT TYPE:  INP   LOCATION:  3738                         FACILITY:  [facility]   PHYSICIAN:  Madolyn Frieze. Jens Som, MD, FACCDATE OF BIRTH:  09/27/1950   DATE OF CONSULTATION:  11/01/2008  DATE OF DISCHARGE:                                 CONSULTATION   The patient is a 60 year old male with past medical history of  hypertension, hyperlipidemia, tobacco abuse, and strong family history  of coronary artery disease, who I am asked to evaluate for chest pain.  The patient has no prior cardiac history.  He does have dyspnea with  more extreme activities, but not with routine activities.  There is no  orthopnea, PND, or pedal edema.  He does not have palpitations or  syncope.  He has had chest pain occasionally in the past.  The pain is  substernal in location and is described as a pressure.  It does not  radiate.  It is not pleuritic or positional nor is it related to food.  It is not clearly exertional.  He had an episode approximately 5 days  ago that lasted for several minutes.  This one was associated with  nausea.  There is also mild diaphoresis, but there is no shortness of  breath.  He had a second episode on June 18, again it lasted for several  minutes and resolves spontaneously.  He has also noticed some tingling  in his left hand that has been continuous for the past 2 weeks.  Because  of the above, he was admitted by the hospitalist.  His enzymes are  negative, and Cardiology is asked to further evaluate.   ALLERGIES:  He has no known drug allergies.   PRESENT MEDICATIONS:  1. Benazepril 40 mg p.o. daily.  2. Effexor 75 mg p.o. daily.  3. Lopid 600 mg p.o. b.i.d.  4. Enoxaparin 40 mg subcu daily.  5. Aspirin 325 mg p.o. daily.  6. Crestor 20 mg daily.  7. Pepcid 20 mg p.o. q.12 h.   PAST MEDICAL HISTORY:  Significant for hypertension and hyperlipidemia.  There is no  diabetes mellitus.  He has a history of benign prostatic  hypertrophy.  He has had a prior appendectomy.  He also has a history of  depression.   FAMILY HISTORY:  Strongly positive for coronary artery disease.  He has  had 2 brothers, who died of myocardial infarction, one in his late 14s  and another one in his 59s.  His father also had multiple myocardial  infarctions.   SOCIAL HISTORY:  He is married.  He does smoke.  He occasionally  consumes alcohol.  He is a Naval architect.   REVIEW OF SYSTEMS:  He denies any headaches, fevers, or chills.  There  is no productive cough.  There is no hemoptysis.  There is no dysphagia,  odynophagia, melena, or hematochezia.  There is no dysuria or hematuria.  There is no rashes or seizure activity.  He does notice pain in his  lower extremities bilaterally with ambulation,  which has relieved with  rest.  Remaining systems are negative.   PHYSICAL EXAMINATION:  VITAL SIGNS:  Today, blood pressure of 93/51, his  pulse is 72, and his temperature is 98.8.  He is 100% of 2 L.  GENERAL:  He is well developed and well nourished, in no acute distress.  He does not appear to be depressed.  There is no peripheral clubbing.  SKIN:  Warm and dry.  BACK:  Normal.  HEENT:  Normal with normal eyelids.  NECK:  Supple with normal upstroke bilaterally.  No bruits noted.  There  is no jugular venous distention and I cannot appreciate thyromegaly.  CHEST:  Clear to auscultation with normal expansion.  CARDIOVASCULAR:  Regular rate and rhythm.  Normal S1 and S2.  There are  no murmurs.  There is a 2/6 systolic murmur at the apex that radiates to  left axilla.  ABDOMEN:  Nontender and nondistended.  Positive bowel sounds.  No  hepatosplenomegaly.  No masses appreciated.  There is no abdominal  bruit.  He has 2+ femoral pulses bilaterally.  No bruits.  EXTREMITIES:  No edema.  I can palpate no cords.  He has 2+ dorsalis  pedis pulses bilaterally.  NEUROLOGIC:   Grossly intact.   LABORATORIES:  Sodium of 142 with potassium of 4.4.  BUN and creatinine  are 23 and 1.58 respectively.  His glucose is 143.  His white blood cell  count is 4.9 with a hemoglobin of 14.3 and hematocrit of 40.3.  His  platelet count is 201.  Cardiac markers are negative.  Cholesterol panel  shows triglyceride level 451 and HDL of 21.  A chest x-ray shows chronic-  appearing interstitial changes.  An electrocardiogram shows a sinus  rhythm.  The axis is normal.  There is left ventricular hypertrophy.  No  ST changes are noted.   DIAGNOSES:  1. Chest pain - the patient's symptoms have both typical and atypical      features.  His enzymes are negative.  His electrocardiogram is      normal.  However, he has multiple risk factors including an      extremely strong family history of coronary artery disease,      hypertension, hyperlipidemia, and tobacco abuse.  I think we need      definitive evaluation.  The risk and benefits of cardiac      catheterization have been discussed and he agrees to proceed.      Note, his creatinine is minimally elevated at 1.58.  We will      hydrate precardiac catheterization and check an echocardiogram both      for his LV function and for what sounds to be a slight mitral      regurgitation murmur.  2. Hyperlipidemia - he will continue on his present medications.  3. Hypertension - his blood pressure is adequately controlled on his      present medications.  4. Tobacco abuse - we discussed importance of discontinuing this.  5. Probable claudication - he will need a PV evaluation after falling      his cardiac catheterization, but this could be performed as an      outpatient.      Madolyn Frieze Jens Som, MD, [facility]  Electronically Signed     BSC/MEDQ  D:  11/01/2008  T:  11/02/2008  Job:  [id number]

## 2010-10-01 NOTE — Op Note (Signed)
Gary Keller, Gary Keller                 ACCOUNT NO.:  [id number]   MEDICAL RECORD NO.:  [id number]          PATIENT TYPE:  AMB   LOCATION:  ENDO                         FACILITY:  [facility]   PHYSICIAN:  Anselmo Rod, M.D.  DATE OF BIRTH:  [birth year]   DATE OF PROCEDURE:  12/27/2004  DATE OF DISCHARGE:                                 OPERATIVE REPORT   PROCEDURE:  Colonoscopy with cold biopsies x3.   ENDOSCOPIST:  Anselmo Rod, M.D.   INSTRUMENT USED:  Olympus videocolonoscope.   INDICATIONS FOR PROCEDURE:  The patient is a 60 year old white male  undergoing screening colonoscopy to rule out chronic polyps, masses, etc.   PREPROCEDURE PREPARATION:  Informed consent was secured from the patient.  The patient fasted for eight hours prior to the procedure.  The prep was a  bottle of magnesium citrate and a gallon of GoLYTELY the night prior to the  procedure.  The risks and benefits of the procedure, including a 10% missed  rate of cancer and polyps was discussed with the patient as well.   PREPROCEDURE PHYSICAL EXAM:  VITAL SIGNS:  The patient had stable vital  signs.  NECK:  Supple  CHEST:  Clear to auscultation.  S1 and S2 regular.  ABDOMEN:  Soft with normal bowel sounds.   DESCRIPTION OF PROCEDURE:  The patient was placed in the left lateral  decubitus position and sedated with 75 mg of Demerol and 7.5 mg of Versed in  slow incremental doses.  Once the patient was adequately sedated, maintained  on low-flow oxygen, and continuous cardiac monitoring, the Olympus  videocolonoscope was advanced from the rectum to the cecum.  The appendiceal  orifice and the ileocecal valve were clearly visualized and photographed.  Three small sessile polyps were biopsied from 100 cm (cold biopsies x3).  A  small external hemorrhoid was seen on anal inspection, and small internal  hemorrhoids were seen on retroflexion in the rectum.  There was no evidence  of diverticulosis.  No large masses or  polyps were seen.  The patient  tolerated the procedure well without complication.   IMPRESSION:  1.  Normal colonoscopy up to the terminal ileum, except for three small      sessile polyps biopsied from 100 cm.  2.  Small external hemorrhoid.  3.  Small nonbleeding internal hemorrhoids.   RECOMMENDATIONS:  1.  Await pathology results.  2.  Avoid all nonsteroidals, including aspirin, for the next 10 days.  3.  Repeat colonoscopy depending on pathology results.  4.  Outpatient followup as need arises in the future.      Anselmo Rod, M.D.  Electronically Signed     JNM/MEDQ  D:  12/27/2004  T:  12/27/2004  Job:  [id number]   cc:   Marjory Lies, M.D.  [id number]  [place]  [place] [address]  Fax: [phone]

## 2015-03-04 ENCOUNTER — Other Ambulatory Visit: Payer: Self-pay

## 2015-03-04 DIAGNOSIS — R899 Unspecified abnormal finding in specimens from other organs, systems and tissues: Secondary | ICD-10-CM | POA: Insufficient documentation

## 2015-03-04 DIAGNOSIS — Z125 Encounter for screening for malignant neoplasm of prostate: Secondary | ICD-10-CM | POA: Insufficient documentation

## 2015-03-04 DIAGNOSIS — J302 Other seasonal allergic rhinitis: Secondary | ICD-10-CM | POA: Insufficient documentation

## 2015-03-04 DIAGNOSIS — E1129 Type 2 diabetes mellitus with other diabetic kidney complication: Secondary | ICD-10-CM

## 2015-03-04 DIAGNOSIS — I10 Essential (primary) hypertension: Secondary | ICD-10-CM | POA: Insufficient documentation

## 2015-03-04 DIAGNOSIS — E785 Hyperlipidemia, unspecified: Secondary | ICD-10-CM | POA: Insufficient documentation

## 2015-03-04 DIAGNOSIS — F329 Major depressive disorder, single episode, unspecified: Secondary | ICD-10-CM | POA: Insufficient documentation

## 2015-03-04 DIAGNOSIS — E6609 Other obesity due to excess calories: Secondary | ICD-10-CM | POA: Insufficient documentation

## 2015-03-04 DIAGNOSIS — F32A Depression, unspecified: Secondary | ICD-10-CM | POA: Insufficient documentation

## 2015-03-04 DIAGNOSIS — E119 Type 2 diabetes mellitus without complications: Secondary | ICD-10-CM | POA: Insufficient documentation

## 2015-03-04 DIAGNOSIS — R799 Abnormal finding of blood chemistry, unspecified: Secondary | ICD-10-CM | POA: Insufficient documentation

## 2015-03-08 NOTE — Progress Notes (Signed)
     HPI: 64 yo male for evaluation of CAD. Seen previously in this office but not since 7/11. Cardiac catheterization 6/10 revealed normal LV function. There was significant coronary disease and he had PCI of his posterior lateral and right coronary artery. He also had PCI of his PDA. Note all of these were drug-eluting stents. Abdominal ultrasound in January of 2011 showed no aneurysm. Patient has noticed increased dyspnea on exertion but no orthopnea, PND, pedal edema, chest pain, palpitations or syncope.  Current Outpatient Prescriptions  Medication Sig Dispense Refill  . aspirin 81 MG tablet Take by mouth.    Marland Kitchen. atorvastatin (LIPITOR) 40 MG tablet Take by mouth.    . benazepril (LOTENSIN) 40 MG tablet Take by mouth.    . cetirizine (ZYRTEC) 10 MG tablet Take by mouth.    . clopidogrel (PLAVIX) 75 MG tablet Take by mouth.    . fenofibrate 160 MG tablet Take by mouth.    . venlafaxine XR (EFFEXOR-XR) 75 MG 24 hr capsule Take by mouth.     No current facility-administered medications for this visit.    No Known Allergies   Past Medical History  Diagnosis Date  . Diabetes mellitus (HCC)     Borderline  . HTN (hypertension)   . Hyperlipidemia   . Rhinitis, allergic   . CAD (coronary artery disease)   . Depression   . Encounter for screening for malignant neoplasm of prostate   . Non morbid obesity due to excess calories   . Renal insufficiency     Past Surgical History  Procedure Laterality Date  . Appendectomy      Social History   Social History  . Marital Status: Single    Spouse Name: N/A  . Number of Children: 3  . Years of Education: N/A   Occupational History  .      Retired   Social History Main Topics  . Smoking status: Former Games developermoker  . Smokeless tobacco: Not on file  . Alcohol Use: 0.0 oz/week    0 Standard drinks or equivalent per week     Comment: Occasional  . Drug Use: Not on file  . Sexual Activity: Not on file   Other Topics Concern  . Not  on file   Social History Narrative    Family History  Problem Relation Age of Onset  . Diabetes Brother   . Heart disease Brother   . Heart disease Father   . Hypertension Brother   . Stroke Mother   . Stroke Brother     ROS: no fevers or chills, productive cough, hemoptysis, dysphasia, odynophagia, melena, hematochezia, dysuria, hematuria, rash, seizure activity, orthopnea, PND, pedal edema, claudication. Remaining systems are negative.  Physical Exam:   Blood pressure 160/70, pulse 91, height 5\' 2"  (1.575 m), weight 99.701 kg (219 lb 12.8 oz).  General:  Well developed/obese in NAD Skin warm/dry Patient not depressed No peripheral clubbing Back-normal HEENT-normal/normal eyelids Neck supple/normal carotid upstroke bilaterally; no bruits; no JVD; no thyromegaly chest - CTA/ normal expansion CV - RRR/normal S1 and S2; no murmurs, rubs or gallops;  PMI nondisplaced Abdomen -NT/ND, no HSM, no mass, + bowel sounds, no bruit 2+ femoral pulses, no bruits Ext-no edema, chords, 2+ DP Neuro-grossly nonfocal  ECG Sinus rhythm at a rate of 91. No ST changes. Atrial enlargement.

## 2015-03-09 ENCOUNTER — Ambulatory Visit (INDEPENDENT_AMBULATORY_CARE_PROVIDER_SITE_OTHER): Payer: Medicare Other | Admitting: Cardiology

## 2015-03-09 ENCOUNTER — Encounter: Payer: Self-pay | Admitting: *Deleted

## 2015-03-09 ENCOUNTER — Encounter: Payer: Self-pay | Admitting: Cardiology

## 2015-03-09 VITALS — BP 160/70 | HR 91 | Ht 62.0 in | Wt 219.8 lb

## 2015-03-09 DIAGNOSIS — I2510 Atherosclerotic heart disease of native coronary artery without angina pectoris: Secondary | ICD-10-CM

## 2015-03-09 DIAGNOSIS — E669 Obesity, unspecified: Secondary | ICD-10-CM

## 2015-03-09 MED ORDER — METOPROLOL SUCCINATE ER 25 MG PO TB24: 25.0000 mg | ORAL_TABLET | Freq: Every day | ORAL | Status: DC

## 2015-03-09 NOTE — Patient Instructions (Addendum)
Medication Instructions:   Stop plavix  START METOPROLOL SUCC ER 25 MG ONCE DAILY   Testing/Procedures:  Your physician has requested that you have en exercise stress myoview. For further information please visit [email or web address] Please follow instruction sheet, as given. DO NOT TAKE METOPROLOL AM OF TEST    Follow-Up:  Your physician wants you to follow-up in: ONE YEAR WITH DR Shelda PalRENSHAW You will receive a reminder letter in the mail two months in advance. If you don't receive a letter, please call our office to schedule the follow-up appointment.   If you need a refill on your cardiac medications before your next appointment, please call your pharmacy.

## 2015-03-09 NOTE — Assessment & Plan Note (Signed)
Continue present medications. Most recent LDL 51 and triglycerides greater than 300.

## 2015-03-09 NOTE — Assessment & Plan Note (Signed)
We discussed risk factor modification including diet and exercise. Needs weight loss.

## 2015-03-09 NOTE — Assessment & Plan Note (Addendum)
Continue aspirin and statin. Discontinue Plavix. Patient notes increased dyspnea on exertion. Schedule stress nuclear study for risk stratification.

## 2015-03-09 NOTE — Assessment & Plan Note (Signed)
Blood pressure is mildly elevated.  Add Toprol 25 mg daily and increase medications as needed.

## 2015-03-12 ENCOUNTER — Telehealth (HOSPITAL_COMMUNITY): Payer: Self-pay

## 2015-03-12 NOTE — Telephone Encounter (Signed)
Encounter complete. 

## 2015-03-17 ENCOUNTER — Ambulatory Visit (HOSPITAL_COMMUNITY)
Admission: RE | Admit: 2015-03-17 | Discharge: 2015-03-17 | Disposition: A | Discharge status: Home / Self Care | Payer: Medicare Other | Source: Ambulatory Visit | Attending: Internal Medicine | Admitting: Internal Medicine

## 2015-03-17 DIAGNOSIS — I2510 Atherosclerotic heart disease of native coronary artery without angina pectoris: Secondary | ICD-10-CM | POA: Diagnosis not present

## 2015-03-17 DIAGNOSIS — Z8249 Family history of ischemic heart disease and other diseases of the circulatory system: Secondary | ICD-10-CM | POA: Diagnosis not present

## 2015-03-17 DIAGNOSIS — Z6841 Body Mass Index (BMI) 40.0 and over, adult: Secondary | ICD-10-CM | POA: Insufficient documentation

## 2015-03-17 DIAGNOSIS — I10 Essential (primary) hypertension: Secondary | ICD-10-CM | POA: Diagnosis not present

## 2015-03-17 DIAGNOSIS — R0609 Other forms of dyspnea: Secondary | ICD-10-CM | POA: Diagnosis not present

## 2015-03-17 DIAGNOSIS — Z87891 Personal history of nicotine dependence: Secondary | ICD-10-CM | POA: Diagnosis not present

## 2015-03-17 DIAGNOSIS — E669 Obesity, unspecified: Secondary | ICD-10-CM | POA: Insufficient documentation

## 2015-03-17 LAB — MYOCARDIAL PERFUSION IMAGING
CHL CUP NUCLEAR SDS: 1
CHL CUP NUCLEAR SRS: 0
CHL CUP RESTING HR STRESS: 81 {beats}/min
CHL RATE OF PERCEIVED EXERTION: 17
CSEPEW: 7.1 METS
CSEPHR: 89 %
Exercise duration (min): 5 min
LV dias vol: 68 mL
LV sys vol: 30 mL
MPHR: 155 {beats}/min
Peak HR: 139 {beats}/min
SSS: 1
TID: 1.08

## 2015-03-17 MED ORDER — TECHNETIUM TC 99M SESTAMIBI GENERIC - CARDIOLITE: 31.5000 | Freq: Once | INTRAVENOUS | Status: DC | PRN

## 2015-03-17 MED ORDER — TECHNETIUM TC 99M SESTAMIBI GENERIC - CARDIOLITE: 10.9000 | Freq: Once | INTRAVENOUS | Status: DC | PRN

## 2015-03-24 ENCOUNTER — Encounter: Payer: Self-pay | Admitting: Cardiology

## 2015-06-08 ENCOUNTER — Telehealth: Payer: Self-pay | Admitting: *Deleted

## 2015-06-08 NOTE — Telephone Encounter (Signed)
Pt is having a EGD and Colonoscopy 07/01/15, they are asking for the pt to hold plavix and aspirin for 5 days prior to the procedure. Will forward for dr Jens Som review

## 2015-06-08 NOTE — Telephone Encounter (Signed)
Will fax this note to dr Loreta Ave

## 2015-06-08 NOTE — Telephone Encounter (Signed)
Ok to hold asa and plavix for procedure Gary Keller  

## 2015-07-29 DIAGNOSIS — I2510 Atherosclerotic heart disease of native coronary artery without angina pectoris: Secondary | ICD-10-CM | POA: Insufficient documentation

## 2016-03-23 NOTE — Progress Notes (Signed)
HPI: FU CAD. Cardiac catheterization 6/10 revealed normal LV function. There was significant coronary disease and he had PCI of his posterior lateral and right coronary artery. He also had PCI of his PDA. Note all of these were drug-eluting stents. Abdominal ultrasound in January of 2011 showed no aneurysm. Nuclear study November 2016 showed ejection fraction 56%. There were ECG changes but perfusion normal. Since last seen, the patient has dyspnea with more extreme activities but not with routine activities. It is relieved with rest. It is not associated with chest pain. There is no orthopnea, PND or pedal edema. There is no syncope or palpitations. There is no exertional chest pain.   Current Outpatient Prescriptions  Medication Sig Dispense Refill  . aspirin 81 MG tablet Take by mouth.    Marland Kitchen. atorvastatin (LIPITOR) 40 MG tablet Take by mouth.    . benazepril (LOTENSIN) 40 MG tablet Take by mouth.    . cetirizine (ZYRTEC) 10 MG tablet Take by mouth.    . clopidogrel (PLAVIX) 75 MG tablet Take 75 mg by mouth daily.    . fenofibrate 160 MG tablet Take by mouth.    . metoprolol succinate (TOPROL XL) 25 MG 24 hr tablet Take 1 tablet (25 mg total) by mouth daily. 90 tablet 3  . venlafaxine XR (EFFEXOR-XR) 75 MG 24 hr capsule Take by mouth.     No current facility-administered medications for this visit.      Past Medical History:  Diagnosis Date  . CAD (coronary artery disease)   . Depression   . Diabetes mellitus (HCC)    Borderline  . Encounter for screening for malignant neoplasm of prostate   . HTN (hypertension)   . Hyperlipidemia   . Non morbid obesity due to excess calories   . Renal insufficiency   . Rhinitis, allergic     Past Surgical History:  Procedure Laterality Date  . APPENDECTOMY      Social History   Social History  . Marital status: Single    Spouse name: N/A  . Number of children: 3  . Years of education: N/A   Occupational History  .     Retired   Social History Main Topics  . Smoking status: Former Games developermoker  . Smokeless tobacco: Never Used  . Alcohol use 0.0 oz/week     Comment: Occasional  . Drug use: Unknown  . Sexual activity: Not on file   Other Topics Concern  . Not on file   Social History Narrative  . No narrative on file    Family History  Problem Relation Age of Onset  . Diabetes Brother   . Heart disease Brother   . Heart disease Father   . Hypertension Brother   . Stroke Mother   . Stroke Brother     ROS: no fevers or chills, productive cough, hemoptysis, dysphasia, odynophagia, melena, hematochezia, dysuria, hematuria, rash, seizure activity, orthopnea, PND, pedal edema, claudication. Remaining systems are negative.  Physical Exam: Well-developed well-nourished in no acute distress.  Skin is warm and dry.  HEENT is normal.  Neck is supple.  Chest is clear to auscultation with normal expansion.  Cardiovascular exam is regular rate and rhythm.  Abdominal exam nontender or distended. No masses palpated. Extremities show no edema. neuro grossly intact  ECG-Sinus rhythm with ventricular bigeminy. No ST changes.  A/P  1 Hyperlipidemia-continue statin.  2 hypertension-blood pressure controlled. However patient notices a cough. Discontinue lotensin and treat with Cozaar 100 mg  daily.  3 coronary artery disease-continue aspirin and statin.  4 obesity-we discussed the importance of diet, exercise and weight loss.   5 bruit-schedule ultrasound to exclude aneurysm.  Olga MillersBrian Jaylina Ramdass, MD

## 2016-03-28 ENCOUNTER — Ambulatory Visit (INDEPENDENT_AMBULATORY_CARE_PROVIDER_SITE_OTHER): Payer: Medicare Other | Admitting: Cardiology

## 2016-03-28 ENCOUNTER — Encounter: Payer: Self-pay | Admitting: Cardiology

## 2016-03-28 VITALS — BP 140/84 | HR 94 | Ht 62.0 in | Wt 219.0 lb

## 2016-03-28 DIAGNOSIS — I2510 Atherosclerotic heart disease of native coronary artery without angina pectoris: Secondary | ICD-10-CM | POA: Diagnosis not present

## 2016-03-28 DIAGNOSIS — E7800 Pure hypercholesterolemia, unspecified: Secondary | ICD-10-CM

## 2016-03-28 DIAGNOSIS — R0989 Other specified symptoms and signs involving the circulatory and respiratory systems: Secondary | ICD-10-CM

## 2016-03-28 DIAGNOSIS — I10 Essential (primary) hypertension: Secondary | ICD-10-CM

## 2016-03-28 MED ORDER — LOSARTAN POTASSIUM 100 MG PO TABS: 100.0000 mg | ORAL_TABLET | Freq: Every day | ORAL | 3 refills | Status: DC

## 2016-03-28 NOTE — Patient Instructions (Signed)
Medication Instructions:   STOP BENAZEPRIL  START LOSARTAN 100 MG ONCE DAILY  Testing/Procedures:  Your physician has requested that you have an abdominal aorta duplex. During this test, an ultrasound is used to evaluate the aorta. Allow 30 minutes for this exam. Do not eat after midnight the day before and avoid carbonated beverages   Follow-Up:  Your physician wants you to follow-up in: ONE YEAR WITH DR Shelda PalRENSHAW You will receive a reminder letter in the mail two months in advance. If you don't receive a letter, please call our office to schedule the follow-up appointment.   If you need a refill on your cardiac medications before your next appointment, please call your pharmacy.

## 2016-04-14 DIAGNOSIS — Z87891 Personal history of nicotine dependence: Secondary | ICD-10-CM | POA: Insufficient documentation

## 2016-04-20 DIAGNOSIS — R911 Solitary pulmonary nodule: Secondary | ICD-10-CM | POA: Insufficient documentation

## 2016-04-20 DIAGNOSIS — IMO0001 Reserved for inherently not codable concepts without codable children: Secondary | ICD-10-CM | POA: Insufficient documentation

## 2016-04-22 DIAGNOSIS — R9389 Abnormal findings on diagnostic imaging of other specified body structures: Secondary | ICD-10-CM | POA: Insufficient documentation

## 2016-04-25 ENCOUNTER — Ambulatory Visit (HOSPITAL_COMMUNITY)
Admission: RE | Admit: 2016-04-25 | Discharge: 2016-04-25 | Disposition: A | Discharge status: Home / Self Care | Payer: Medicare Other | Source: Ambulatory Visit | Attending: Cardiology | Admitting: Cardiology

## 2016-04-25 ENCOUNTER — Other Ambulatory Visit: Payer: Self-pay | Admitting: Cardiology

## 2016-04-25 DIAGNOSIS — R0989 Other specified symptoms and signs involving the circulatory and respiratory systems: Secondary | ICD-10-CM | POA: Insufficient documentation

## 2016-04-25 DIAGNOSIS — I700 Atherosclerosis of aorta: Secondary | ICD-10-CM | POA: Diagnosis not present

## 2016-05-26 ENCOUNTER — Telehealth: Payer: Self-pay | Admitting: Pulmonary Disease

## 2016-05-26 NOTE — Telephone Encounter (Signed)
IMAGING CT CHEST W/ 04/20/16 (per radiologist): 9 mm left lower lobe nodule. Diffuse interlobular septal thickening with architectural distortion inferiorly with honeycombing versus bronchiectasis and medial right lower lobe. No pleural effusion or calcification. No cardiomegaly or pericardial effusion. No lymphadenopathy or mass with a mediastinum. Fatty infiltration of the liver noted.  CT CHEST W/ 3/6/9 (per radiologist): No mediastinal hematoma. No pneumothorax or pleural effusion. Irregular interstitial prominence in addition. Ill-defined cystic lucencies diffusely. Fracture of left 10th rib noted.

## 2016-05-27 ENCOUNTER — Encounter: Payer: Self-pay | Admitting: Pulmonary Disease

## 2016-05-27 ENCOUNTER — Ambulatory Visit (INDEPENDENT_AMBULATORY_CARE_PROVIDER_SITE_OTHER): Payer: Medicare Other | Admitting: Pulmonary Disease

## 2016-05-27 DIAGNOSIS — Z23 Encounter for immunization: Secondary | ICD-10-CM | POA: Diagnosis not present

## 2016-05-27 DIAGNOSIS — R0681 Apnea, not elsewhere classified: Secondary | ICD-10-CM

## 2016-05-27 DIAGNOSIS — R911 Solitary pulmonary nodule: Secondary | ICD-10-CM

## 2016-05-27 DIAGNOSIS — K219 Gastro-esophageal reflux disease without esophagitis: Secondary | ICD-10-CM | POA: Diagnosis not present

## 2016-05-27 DIAGNOSIS — IMO0001 Reserved for inherently not codable concepts without codable children: Secondary | ICD-10-CM

## 2016-05-27 DIAGNOSIS — J849 Interstitial pulmonary disease, unspecified: Secondary | ICD-10-CM

## 2016-05-27 DIAGNOSIS — G4733 Obstructive sleep apnea (adult) (pediatric): Secondary | ICD-10-CM | POA: Insufficient documentation

## 2016-05-27 NOTE — Progress Notes (Signed)
Subjective:    Patient ID: Gary Keller, male    DOB: 04/18/1951, 66 y.o.   MRN: [id number]  HPI He reports starting 1-2 years ago he noticed dyspnea on exertion. He does feel his dyspnea is worse with exertion than others. Overall he reports his level of dyspnea is unchanged. He does notice dyspnea with being "upset". No worsening of symptoms with exposure to inhaled irritants such as smoke or perfumes. Previously had a cough but this significantly improved after coming of off his previous ACE Inhibitor. He reports he still has a mild, intermittent cough that is rarely productive of a white mucus. Denies any hemoptysis. He reports intermittent wheezing. He reports he has never had a breathing problem of this nature before. Denies any history of bronchitis or pneumonia. No history of breathing problems as a child or young adult. Does have rare sinus congestion & drainage with seasonal changes. Does take medication for seasonal allergies. He reports only rare morning brash water taste. Otherwise no reflux or dyspepsia on Zantac. No dysphagia or odynophagia recently. He denies any rashes but does bruise easily on Plavix. Patient does snore very loudly and also stops breathing at times with witnessed apneas. Denies any morning headaches. Reports he does not always feel well rested in the morning when he gets up. He usually sleeps from 9pm to 6am and only rarely gets up once per night. Does nap frequently. Does dose off easily if he is sitting watching TV. No dysuria or hematuria. No Raynaud's.   Review of Systems No dry eyes, dry mouth, or oral ulcers. He denies any joint swelling or erythema. Does have mild, nonspecific joint pains. Has brief stiffness in his hands that resolves after a few minutes in the morning. A pertinent 14 point review of systems is negative except as per the history of presenting illness.  No Known Allergies  Current Outpatient Prescriptions on File Prior to Visit    Medication Sig Dispense Refill  . atorvastatin (LIPITOR) 40 MG tablet Take by mouth.    . cetirizine (ZYRTEC) 10 MG tablet Take by mouth.    . clopidogrel (PLAVIX) 75 MG tablet Take 75 mg by mouth daily.    . fenofibrate 160 MG tablet Take by mouth.    . losartan (COZAAR) 100 MG tablet Take 1 tablet (100 mg total) by mouth daily. 90 tablet 3  . metoprolol succinate (TOPROL XL) 25 MG 24 hr tablet Take 1 tablet (25 mg total) by mouth daily. 90 tablet 3  . venlafaxine XR (EFFEXOR-XR) 75 MG 24 hr capsule Take by mouth.     No current facility-administered medications on file prior to visit.     Past Medical History:  Diagnosis Date  . CAD (coronary artery disease)   . Depression   . Diabetes mellitus (HCC)    Borderline  . Encounter for screening for malignant neoplasm of prostate   . HTN (hypertension)   . Hyperlipidemia   . Non morbid obesity due to excess calories   . Renal insufficiency   . Rhinitis, allergic     Past Surgical History:  Procedure Laterality Date  . APPENDECTOMY    . COLONOSCOPY WITH ESOPHAGOGASTRODUODENOSCOPY (EGD)    . LEFT HEART CATH  2006   Stent x3    Family History  Problem Relation Age of Onset  . Diabetes Brother   . Heart disease Brother   . Heart disease Father   . Hypertension Brother   . Stroke Mother   .  Stroke Brother   . Lung disease Neg Hx   . Rheumatologic disease Neg Hx     Social History   Social History  . Marital status: Single    Spouse name: N/A  . Number of children: 3  . Years of education: N/A   Occupational History  .      Retired   Social History Main Topics  . Smoking status: Former Smoker    Packs/day: 1.00    Years: 30.00    Quit date: 05/16/2001  . Smokeless tobacco: Never Used  . Alcohol use 0.0 oz/week     Comment: Occasional  . Drug use: No  . Sexual activity: Not Asked   Other Topics Concern  . None   Social History Narrative   [facility] (05/27/16):   Originally from [facility]. Has always  lived in [place]. 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 Does have exposure to chemicals from spraying. No mold exposure. Remote cockatiel exposure in a previous home. No hot tub exposure. Enjoys watching his grandchildren and hunting.       Objective:   Physical Exam BP 140/80 (BP Location: Right Arm, Cuff Size: Normal)   Pulse 84   Ht 5\' 2"  (1.575 m)   Wt 216 lb 4.8 oz (98.1 kg)   SpO2 95%   BMI 39.56 kg/m  General:  Awake. Alert. No acute distress. Morbidly obese male. Integument:  Warm & dry. No rash on exposed skin. No bruising. Extremities:  No cyanosis or clubbing.  Lymphatics:  No appreciated cervical or supraclavicular lymphadenoapthy. HEENT:  Moist mucus membranes. No oral ulcers. No scleral injection or icterus. Mallampati class IV. Neck circumference 19 inches. Cardiovascular:  Regular rate. No edema. Normal S1 & S2. Pulmonary:  Bilateral basilar Velcro crackles to the mid lung zones. Symmetric chest wall expansion. No accessory muscle use on room air. Abdomen: Soft. Normal bowel sounds. Protuberant. Grossly nontender. Musculoskeletal:  Normal bulk and tone. Hand grip strength 5/5 bilaterally. No joint deformity or effusion appreciated. Mild synovial thickening of bilateral PIP & DIP joints. Neurological:  CN 2-12 grossly in tact. No meningismus. Moving all 4 extremities equally. Symmetric brachioradialis deep tendon reflexes. Psychiatric:  Mood and affect congruent. Speech normal rhythm, rate & tone.   IMAGING CT CHEST W/ 04/20/16 (per radiologist): 9 mm left lower lobe nodule. Diffuse interlobular septal thickening with architectural distortion inferiorly with honeycombing versus bronchiectasis and medial right lower lobe. No pleural effusion or calcification. No cardiomegaly or pericardial effusion. No lymphadenopathy or mass with a mediastinum. Fatty infiltration of the liver noted.  CT CHEST W/ 3/6/9 (per radiologist): No mediastinal hematoma. No  pneumothorax or pleural effusion. Irregular interstitial prominence in addition. Ill-defined cystic lucencies diffusely. Fracture of left 10th rib noted.    Assessment & Plan:  66 y.o. male found to have a left lower lobe 9 mm nodule as well as CT findings in December concerning for interstitial lung disease based on routine chest CT imaging per radiology report. 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 His left lower lobe nodule certainly concerning given his history of tobacco use and the risk of malignancy. I will need to review the CT imaging to determine further workup at this time I feel that repeat CT imaging is reasonable. By body habitus and symptoms he has a high probability of having obstructive sleep apnea. We did discuss CPAP therapy and we will be testing him for such. His reflux seems to  be well-controlled at this time. I cannot say that he doesn't have underlying COPD or even emphysema contributing to his dyspnea on exertion but this could all be related to his underlying interstitial lung disease. I instructed the patient to contact my office if he had any new breathing problems or questions before his next appointment.  1. ILD: Checking high-resolution CT chest without contrast with both prone and supine imaging. Checking for pulmonary function testing as well as 6 minute walk test on room air on her before next appointment. May require serum workup depending upon results. 2. Left Lower Lobe Lung Nodule: Patient's son to provide me with CT imaging from his December CT scan. Plan for repeat CT chest without contrast in March and possible biopsy versus further workup depending upon this result. 3. Witnessed Apnea: Checking split-night sleep study. 4. GERD: Well controlled with Zantac. No changes at this time. 5. Health Maintenance:  Administering high-dose influenza vaccine today.  6. Follow-up:  Return to clinic in 4 weeks or sooner if  needed.  Donna ChristenJennings E. Jamison NeighborNestor, M.D. [facility] Pager:  [phone] After 3pm or if no response, call [phone] 9:25 AM 05/27/16

## 2016-05-27 NOTE — Addendum Note (Signed)
Addended by: Cydney OkAUGUSTIN, Milla Wahlberg N on: 05/27/2016 09:56 AM   Modules accepted: Orders

## 2016-05-27 NOTE — Patient Instructions (Signed)
   Please have your son drop off the CT scan of your chest to my office for review.  I will see you back to discuss your results in a few weeks.  Call me if you have any new breathing problems or questions before your next appointment.  TESTS ORDERED: 1. CT CHEST W/O March 2018 2. HRCT Chest w/o with prone & supine imaging 3. Full PFTs on or before next appointment 4. 6MWT on room air on or before next appointment 5. Split Night Sleep Study

## 2016-05-31 ENCOUNTER — Ambulatory Visit (INDEPENDENT_AMBULATORY_CARE_PROVIDER_SITE_OTHER)
Admission: RE | Admit: 2016-05-31 | Discharge: 2016-05-31 | Disposition: A | Discharge status: Home / Self Care | Payer: Medicare Other | Source: Ambulatory Visit | Attending: Pulmonary Disease | Admitting: Pulmonary Disease

## 2016-05-31 DIAGNOSIS — R911 Solitary pulmonary nodule: Secondary | ICD-10-CM

## 2016-05-31 DIAGNOSIS — IMO0001 Reserved for inherently not codable concepts without codable children: Secondary | ICD-10-CM

## 2016-05-31 DIAGNOSIS — J849 Interstitial pulmonary disease, unspecified: Secondary | ICD-10-CM | POA: Diagnosis not present

## 2016-06-06 NOTE — Addendum Note (Signed)
Addended by: Alyson InglesBROOME, Johnette Teigen L on: 06/06/2016 10:50 AM   Modules accepted: Orders

## 2016-06-07 ENCOUNTER — Ambulatory Visit: Payer: Medicare Other | Attending: Pulmonary Disease | Admitting: Pulmonary Disease

## 2016-06-07 ENCOUNTER — Encounter (INDEPENDENT_AMBULATORY_CARE_PROVIDER_SITE_OTHER): Payer: Self-pay

## 2016-06-07 DIAGNOSIS — R0681 Apnea, not elsewhere classified: Secondary | ICD-10-CM

## 2016-06-07 DIAGNOSIS — G4733 Obstructive sleep apnea (adult) (pediatric): Secondary | ICD-10-CM | POA: Diagnosis not present

## 2016-06-09 ENCOUNTER — Other Ambulatory Visit: Payer: Self-pay | Admitting: Pulmonary Disease

## 2016-06-09 DIAGNOSIS — G4733 Obstructive sleep apnea (adult) (pediatric): Secondary | ICD-10-CM

## 2016-06-09 NOTE — Procedures (Signed)
Patient Name: Gary Keller, Brink Date: 06/07/2016 Gender: Male D.O.B: 01/11/1951 Age (years): 65 Referring Provider: Javier Glazier Height (inches): 39 Interpreting Physician: Kara Mead MD, ABSM Weight (lbs): 217 RPSGT: Rosebud Poles BMI: 40 MRN: [id number] Neck Size: 21.00   CLINICAL INFORMATION Sleep Study Type: Split Night CPAP  Indication for sleep study: Witnessed Apneas  Epworth Sleepiness Score: 17  SLEEP STUDY TECHNIQUE As per the AASM Manual for the Scoring of Sleep and Associated Events v2.3 (April 2016) with a hypopnea requiring 4% desaturations.  The channels recorded and monitored were frontal, central and occipital EEG, electrooculogram (EOG), submentalis EMG (chin), nasal and oral airflow, thoracic and abdominal wall motion, anterior tibialis EMG, snore microphone, electrocardiogram, and pulse oximetry. Continuous positive airway pressure (CPAP) was initiated when the patient met split night criteria and was titrated according to treat sleep-disordered breathing.  MEDICATIONS Medications self-administered by patient taken the night of the study : N/A  RESPIRATORY PARAMETERS Diagnostic  Total AHI (/hr): 50.0 RDI (/hr): 50.0 OA Index (/hr): 9.3 CA Index (/hr): 0.0 REM AHI (/hr): 96.0 NREM AHI (/hr): 48.5 Supine AHI (/hr): N/A Non-supine AHI (/hr): 50.00 Min O2 Sat (%): 78.00 Mean O2 (%): 90.42 Time below 88% (min): 21.5   Titration  Optimal Pressure (cm):  AHI at Optimal Pressure (/hr): N/A Min O2 at Optimal Pressure (%): 81.00 Supine % at Optimal (%): N/A Sleep % at Optimal (%): N/A     SLEEP ARCHITECTURE The recording time for the entire night was 438.9 minutes.  During a baseline period of 222.5 minutes, the patient slept for 162.0 minutes in REM and nonREM, yielding a sleep efficiency of 72.8%. Sleep onset after lights out was 45.3 minutes with a REM latency of 155.5 minutes. The patient spent 11.42% of the night in stage N1 sleep, 60.49% in  stage N2 sleep, 25.00% in stage N3 and 3.09% in REM.  During the titration period of 208.8 minutes, the patient slept for 164.1 minutes in REM and nonREM, yielding a sleep efficiency of 78.6%. Sleep onset after CPAP initiation was 18.2 minutes with a REM latency of 111.0 minutes. The patient spent 10.97% of the night in stage N1 sleep, 51.79% in stage N2 sleep, 25.67% in stage N3 and 11.58% in REM.  CARDIAC DATA The 2 lead EKG demonstrated sinus rhythm. The mean heart rate was N/A beats per minute. Other EKG findings include: None.   LEG MOVEMENT DATA The total Periodic Limb Movements of Sleep (PLMS) were 0. The PLMS index was 0.00 .  IMPRESSIONS - Severe obstructive sleep apnea occurred during the diagnostic portion of the study (AHI = 50.0/hour). An optimal PAP pressure of 14 cm was selected for this patient based on the available study data. - No significant central sleep apnea occurred during the diagnostic portion of the study (CAI = 0.0/hour). - Moderate oxygen desaturation was noted during the diagnostic portion of the study (Min O2 =78.00%). - The patient snored with Loud snoring volume during the diagnostic portion of the study. - No cardiac abnormalities were noted during this study. - Clinically significant periodic limb movements did not occur during sleep.   DIAGNOSIS - Obstructive Sleep Apnea (327.23 [G47.33 ICD-10])   RECOMMENDATIONS - CPAP 14 cm with small full face mask, note that REM supine sleep was not observed during titration - Avoid alcohol, sedatives and other CNS depressants that may worsen sleep apnea and disrupt normal sleep architecture. - Sleep hygiene should be reviewed to assess factors that may improve sleep quality. -  Weight management and regular exercise should be initiated or continued. - Return to [facility] for re-evaluation after 4 weeks of therapy    Kara Mead MD Board Certified in [place]

## 2016-06-09 NOTE — Progress Notes (Signed)
Spoke with patient about sleep study results. Pt was informed to contact office when he gets CPAP machine to make follow up appointment. Pt did not have any additional questions. Will put in CPAP order. Nothing further needed.

## 2016-06-17 ENCOUNTER — Encounter: Payer: Self-pay | Admitting: Pulmonary Disease

## 2016-06-23 ENCOUNTER — Telehealth: Payer: Self-pay | Admitting: Pulmonary Disease

## 2016-06-23 NOTE — Telephone Encounter (Signed)
Disc has been placed with JN's personal belongings on his desk. Will route message to make him aware.

## 2016-06-24 ENCOUNTER — Ambulatory Visit (INDEPENDENT_AMBULATORY_CARE_PROVIDER_SITE_OTHER): Payer: Medicare Other | Admitting: Pulmonary Disease

## 2016-06-24 ENCOUNTER — Encounter: Payer: Self-pay | Admitting: Pulmonary Disease

## 2016-06-24 ENCOUNTER — Telehealth: Payer: Self-pay | Admitting: Pulmonary Disease

## 2016-06-24 ENCOUNTER — Ambulatory Visit (HOSPITAL_COMMUNITY)
Admission: RE | Admit: 2016-06-24 | Discharge: 2016-06-24 | Disposition: A | Discharge status: Home / Self Care | Payer: Medicare Other | Source: Ambulatory Visit | Attending: Pulmonary Disease | Admitting: Pulmonary Disease

## 2016-06-24 ENCOUNTER — Other Ambulatory Visit (INDEPENDENT_AMBULATORY_CARE_PROVIDER_SITE_OTHER): Payer: Medicare Other

## 2016-06-24 VITALS — BP 128/80 | HR 99 | Ht 62.0 in | Wt 216.0 lb

## 2016-06-24 DIAGNOSIS — R911 Solitary pulmonary nodule: Secondary | ICD-10-CM | POA: Diagnosis not present

## 2016-06-24 DIAGNOSIS — R0602 Shortness of breath: Secondary | ICD-10-CM

## 2016-06-24 DIAGNOSIS — K219 Gastro-esophageal reflux disease without esophagitis: Secondary | ICD-10-CM

## 2016-06-24 DIAGNOSIS — G4733 Obstructive sleep apnea (adult) (pediatric): Secondary | ICD-10-CM

## 2016-06-24 DIAGNOSIS — J849 Interstitial pulmonary disease, unspecified: Secondary | ICD-10-CM | POA: Diagnosis not present

## 2016-06-24 DIAGNOSIS — IMO0001 Reserved for inherently not codable concepts without codable children: Secondary | ICD-10-CM

## 2016-06-24 DIAGNOSIS — Z23 Encounter for immunization: Secondary | ICD-10-CM | POA: Diagnosis not present

## 2016-06-24 LAB — PULMONARY FUNCTION TEST
DL/VA % PRED: 105 %
DL/VA: 4.19 ml/min/mmHg/L
DLCO unc % pred: 66 %
DLCO unc: 14.29 ml/min/mmHg
FEF 25-75 Post: 3.11 L/sec
FEF 25-75 Pre: 3.41 L/sec
FEF2575-%Change-Post: -8 %
FEF2575-%PRED-POST: 162 %
FEF2575-%Pred-Pre: 177 %
FEV1-%Change-Post: -2 %
FEV1-%PRED-PRE: 91 %
FEV1-%Pred-Post: 89 %
FEV1-Post: 2.16 L
FEV1-Pre: 2.21 L
FEV1FVC-%CHANGE-POST: 0 %
FEV1FVC-%PRED-PRE: 120 %
FEV6-%Change-Post: 0 %
FEV6-%Pred-Post: 80 %
FEV6-%Pred-Pre: 80 %
FEV6-Post: 2.46 L
FEV6-Pre: 2.46 L
FEV6FVC-%Pred-Post: 106 %
FEV6FVC-%Pred-Pre: 106 %
FVC-%Change-Post: -1 %
FVC-%PRED-POST: 75 %
FVC-%PRED-PRE: 76 %
FVC-POST: 2.46 L
FVC-Pre: 2.49 L
Post FEV1/FVC ratio: 88 %
Post FEV6/FVC ratio: 100 %
Pre FEV1/FVC ratio: 89 %
Pre FEV6/FVC Ratio: 100 %
RV % pred: 53 %
RV: 1.04 L
TLC % pred: 69 %
TLC: 3.77 L

## 2016-06-24 MED ORDER — ALBUTEROL SULFATE (2.5 MG/3ML) 0.083% IN NEBU
2.5000 mg | INHALATION_SOLUTION | Freq: Once | RESPIRATORY_TRACT | Status: AC
  Administered 2016-06-24: 2.5 mg via RESPIRATORY_TRACT

## 2016-06-24 NOTE — Progress Notes (Signed)
Subjective:    Patient ID: Gary Keller, male    DOB: 11-06-1950, 66 y.o.   MRN: [id number]  C.C.:  Follow-up for ILD, Severe OSA, Left Lower Lobe Nodules, & GERD.  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 Continues to have dyspnea on exertion. Denies any coughing or wheezing. Known prior exposure to bird as well as inhaled chemical/pesticides.  Severe OSA: Found on split-night sleep study. Significant desaturation during study. Prescribed CPAP at 14 centimeters H2O pressure. Patient reports he was able to sleep with CPAP during this study. Has not yet received his machine.  Left Lower Lobe Nodule: Seen initially on CT scan 04/20/16. CT scan reviewed today by me. Nodule could be due to fibrotic changes.  GERD:  Prescribed Zantac. No reflux or dyspepsia.   Review of Systems No chest pain, tightness, or pressure. No rashes or abnormal bruising. No new joint swelling or erythema.   No Known Allergies  Current Outpatient Prescriptions on File Prior to Visit  Medication Sig Dispense Refill  . atorvastatin (LIPITOR) 40 MG tablet Take by mouth.    . cetirizine (ZYRTEC) 10 MG tablet Take by mouth.    . clopidogrel (PLAVIX) 75 MG tablet Take 75 mg by mouth daily.    . fenofibrate 160 MG tablet Take by mouth.    . losartan (COZAAR) 100 MG tablet Take 1 tablet (100 mg total) by mouth daily. 90 tablet 3  . metoprolol succinate (TOPROL XL) 25 MG 24 hr tablet Take 1 tablet (25 mg total) by mouth daily. 90 tablet 3  . ranitidine (ZANTAC) 75 MG tablet Take 75 mg by mouth 2 (two) times daily.    Marland Kitchen venlafaxine XR (EFFEXOR-XR) 75 MG 24 hr capsule Take by mouth.     No current facility-administered medications on file prior to visit.     Past Medical History:  Diagnosis Date  . CAD (coronary artery disease)   . Depression   . Diabetes mellitus (HCC)    Borderline  . Encounter for screening for malignant neoplasm of prostate   . HTN  (hypertension)   . Hyperlipidemia   . Non morbid obesity due to excess calories   . Renal insufficiency   . Rhinitis, allergic     Past Surgical History:  Procedure Laterality Date  . APPENDECTOMY    . COLONOSCOPY WITH ESOPHAGOGASTRODUODENOSCOPY (EGD)    . LEFT HEART CATH  2006   Stent x3    Family History  Problem Relation Age of Onset  . Diabetes Brother   . Heart disease Brother   . Heart disease Father   . Hypertension Brother   . Stroke Mother   . Stroke Brother   . Lung disease Neg Hx   . Rheumatologic disease Neg Hx     Social History   Social History  . Marital status: Single    Spouse name: N/A  . Number of children: 3  . Years of education: N/A   Occupational History  .      Retired   Social History Main Topics  . Smoking status: Former Smoker    Packs/day: 1.00    Years: 30.00    Quit date: 05/16/2001  . Smokeless tobacco: Never Used  . Alcohol use 0.0 oz/week     Comment: Occasional  . Drug use: No  . Sexual activity: Not Asked   Other Topics Concern  . None   Social History Narrative  [facility] (05/27/16):   Originally from [facility]. Has always lived in [place]. 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 Does have exposure to chemicals from spraying. No mold exposure. Remote cockatiel exposure in a previous home. No hot tub exposure. Enjoys watching his grandchildren and hunting.       Objective:   Physical Exam BP 128/80 (BP Location: Right Arm, Patient Position: Sitting, Cuff Size: Normal)   Pulse 99   Ht 5' 2"  (1.575 m)   Wt 216 lb (98 kg)   SpO2 96%   BMI 39.51 kg/m   General:  Awake. Obese. No distress. Integument:  Warm & dry. No rash on exposed skin.  Extremities:  No cyanosis or clubbing.  HEENT:  Moist mucus membranes. No oral ulcers. Mild bilateral nasal turbinate swelling. Cardiovascular:  Regular rate. No edema. Normal S1 & S2.  Pulmonary:  Bibasilar crackles are unchanged. No accessory muscle use  on room air. Speaking in complete sentences. Abdomen: Soft. Normal bowel sounds. Protuberant. Musculoskeletal:  Normal bulk and tone. Hand grip strength 5/5 bilaterally. No joint deformity or effusion appreciated. Minimal synovial thickening of bilateral PIP and DIP joints bilaterally.  PFT 06/24/16: FVC 2.49 L (76%) FEV1 2.21 L (91%) FEV1/FVC 0.89 FEF 25-75 3.41 L (177%) negative bronchodilator response TLC 3.77 L (69%) RV 53% ERV 130% DLCO uncorrected 66%  6MWT 06/24/16:  Walked 452 meters / Baseline Sat 97% on RA / Nadir Sat 92% on RA @ end of test  POLYSOMNOGRAM (06/07/16): Severe obstructive sleep apnea with AHI 15.0 events/hour. No significant central sleep apnea. Moderate desaturation during diagnostic portion of the study to 78% on room air. Loud snoring was noted. No cardiac abnormalities were noted. No clinically significant periodic leg movements. Optimal CPAP pressure 14 cm was found for the patient with a small fullface mask. REM supine sleep was not observed during titration.  IMAGING HRCT CHEST W/O 05/31/16 (personally reviewed by me):  Patient does have minimal subpleural patchy groundglass. Traction bronchiectasis with areas of honeycombing and subpleural as well as central distribution of reticulation with intralobular septal thickening. There is a slight craniocaudal progression suggestive of a UIP pattern. Areas of mosaic attenuation consistent with air trapping. No pleural effusion or thickening. Subcarinal and precarinal lymph nodes measuring up to 1.1 & 1.2 cm in short axis. No pericardial effusion. Patient does have an 8 mm oblong nodule within his left lower lobe that corresponds to previously noted left lower lobe nodule.  CT CHEST W/ 04/20/16 (personally reviewed by me): No pleural effusion or thickening. No pathologic mediastinal adenopathy. No pericardial effusion. Patchy bronchiectasis and intralobular septal thickening with suggestion of honeycombing in the sulci  bilaterally. There is a slight craniocaudal progression.Patient does indeed have an oblong dense 9 mm left lower lobe nodule.  CT CHEST W/ 04/20/16 (per radiologist): 9 mm left lower lobe nodule. Diffuse interlobular septal thickening with architectural distortion inferiorly with honeycombing versus bronchiectasis and medial right lower lobe. No pleural effusion or calcification. No cardiomegaly or pericardial effusion. No lymphadenopathy or mass with a mediastinum. Fatty infiltration of the liver noted.  CT CHEST W/ 3/6/9 (per radiologist): No mediastinal hematoma. No pneumothorax or pleural effusion. Irregular interstitial prominence in addition. Ill-defined cystic lucencies diffusely. Fracture of left 10th rib noted.    Assessment & Plan:  66 y.o. Male with interstitial lung disease, severe obstructive sleep apnea, left lower lobe nodule, and GERD.  patient's pattern on high-resolution CT imaging seems to be more consistent with  usual interstitial pneumonia. However, given his history of GERD as well as inhale pesticide/chemical exposure I do question whether or not he may have more of a chronic hypersensitivity pneumonitis picture that is now predominantly fibrotic despite the patchy groundglass. As such, I do not feel immunosuppression or initiation of treatment with Esbriet or Ofev is indicated. Certainly we will need to document  continued stability of his pulmonary function or decline to prompt further treatment or investigation. I am also holding off on surgical lung biopsy at this time. Given the severity of his underlying sleep apnea I do believe the treatment will help improve his quality of life and help to avoid further complications from untreated severe obstructive sleep apnea. I was also able to review his high-resolution CT scan showing his left lower lobe nodule comparing it with his outside chest CT scan performed in December. This nodule has not appreciably changed in size. We will need  to continue to follow this nodule given its size but I suspect it could be simply due to an area of fibrosis given his underlying parenchymal abnormalities. I instructed the patient contact my office if he had any new breathing problems before his next appointment. We did discuss pulmonary rehabilitation but the patient wishes to attempt to increase his exercise at home himself in an effort to lose weight.   1. ILD:  Repeat spirometry with DLCO and 6 prolonged test on room air at next appointment. Holding off on immunosuppression. Checking serum ESR, CRP, hypersensitivity pneumonitis panel, myositis antibody panel, ANA with comprehensive panel, and anti-CCP. 2. Severe OSA: Awaiting arrival of home CPAP. Plan for compliance download. Patient counseled on home desensitization techniques. 3. Left Lower Lobe Nodule: Scheduled for CT chest without contrast in March. 4. GERD: Continuing Zantac. No changes. 5. Health Maintenance:  S/P Influenza Vaccine January 2018. Administering Pneumovax 23 today.  6. Follow-up:  Return to clinic in 3 months or sooner if needed.   Sonia Baller Ashok Cordia, M.D. [facility] Critical [facility] Pager:  [phone] After 3pm or if no response, call [phone] 5:15 PM 06/24/16

## 2016-06-24 NOTE — Patient Instructions (Addendum)
   Remember to start a daily walking/exercise regimen to help you lose weight.  Call me if we need to see you sooner to satisfy your insurance company once you start using your CPAP.  Remember you can use your CPAP while watching TV to help desensitize you to the machine & mask.  Call me if you have any new breathing problems or questions before your next appointment.  I will see you back in 3 months with a breathing & walking test or sooner if needed.  TESTS ORDERED: 1. Spirometry with DLCO at next appointment 2. 6MWT on room air at next appointment 3. Serum ESR, CRP, Hypersensitivity Pneumonitis Panel, Myositis Antibody Panel, ANA with Comprehensive Panel, & Anti-CCP today.

## 2016-06-24 NOTE — Progress Notes (Signed)
6MWT 06/24/16:  Walked 452 meters / Baseline Sat 97% on RA / Nadir Sat 92% on RA @ end of test

## 2016-06-24 NOTE — Telephone Encounter (Signed)
POLYSOMNOGRAM (06/07/16): Severe obstructive sleep apnea with AHI 15.0 events/hour. No significant central sleep apnea. Moderate desaturation during diagnostic portion of the study to 78% on room air. Loud snoring was noted. No cardiac abnormalities were noted. No clinically significant periodic leg movements. Optimal CPAP pressure 14 cm was found for the patient with a small fullface mask. REM supine sleep was not observed during titration.  IMAGING HRCT CHEST W/O 05/31/16 (personally reviewed by me): Traction bronchiectasis with areas of honeycombing and subpleural as well as central distribution of reticulation with intralobular septal thickening. There is a slight craniocaudal progression suggestive of a UIP pattern. Areas of mosaic attenuation consistent with air trapping. No pleural effusion or thickening. Subcarinal and precarinal lymph nodes measuring up to 1.1 & 1.2 cm in short axis. No pericardial effusion.  CT CHEST W/ 04/20/16 (personally reviewed by me):

## 2016-06-27 LAB — C-REACTIVE PROTEIN

## 2016-06-27 LAB — SEDIMENTATION RATE: SED RATE: 6 mm/h (ref 0–20)

## 2016-06-27 LAB — CYCLIC CITRUL PEPTIDE ANTIBODY, IGG: Cyclic Citrullin Peptide Ab: <16 Units

## 2016-07-01 ENCOUNTER — Encounter: Payer: Self-pay | Admitting: Pulmonary Disease

## 2016-07-01 NOTE — Telephone Encounter (Signed)
JN please advise on lab results.

## 2016-07-06 LAB — ANA COMPREHENSIVE PANEL
Centromere Ab Screen: <0.2 AI (ref 0.0–0.9)
DSDNA AB: 1 [IU]/mL (ref 0–9)
ENA RNP Ab: 0.2 AI (ref 0.0–0.9)
ENA SM Ab Ser-aCnc: <0.2 AI (ref 0.0–0.9)
ENA SSA (RO) Ab: <0.2 AI (ref 0.0–0.9)

## 2016-07-06 LAB — MYOSITIS PANEL III
EJ: NEGATIVE
Jo-1 (WB)*: NEGATIVE
Ku*: NEGATIVE
Mi-2 antibodies*: NEGATIVE
OJ*: NEGATIVE
PL-12: NEGATIVE
PL-7: NEGATIVE
PM-SCL 75: NEGATIVE
PM-Scl 100*: NEGATIVE
RNP: 8.2 U/mL
RO-52*: NEGATIVE
Signal Recognition Particle*: NEGATIVE

## 2016-07-06 LAB — HYPERSENSITIVITY PNEUMONITIS
A. FUMIGATUS #1 ABS: NEGATIVE
A. Pullulans Abs: NEGATIVE
Micropolyspora faeni, IgG: NEGATIVE
Pigeon Serum Abs: NEGATIVE
THERMOACT. SACCHARII: NEGATIVE
THERMOACTINOMYCES VULGARIS IGG: NEGATIVE

## 2016-07-28 ENCOUNTER — Ambulatory Visit (INDEPENDENT_AMBULATORY_CARE_PROVIDER_SITE_OTHER)
Admission: RE | Admit: 2016-07-28 | Discharge: 2016-07-28 | Disposition: A | Discharge status: Home / Self Care | Payer: Medicare Other | Source: Ambulatory Visit | Attending: Pulmonary Disease | Admitting: Pulmonary Disease

## 2016-07-28 DIAGNOSIS — R911 Solitary pulmonary nodule: Secondary | ICD-10-CM

## 2016-07-28 DIAGNOSIS — IMO0001 Reserved for inherently not codable concepts without codable children: Secondary | ICD-10-CM

## 2016-07-28 DIAGNOSIS — J849 Interstitial pulmonary disease, unspecified: Secondary | ICD-10-CM | POA: Diagnosis not present

## 2016-07-29 NOTE — Progress Notes (Signed)
Spoke with patient and informed him of CT results. Pt did not have any questions. Nothing further is needed.

## 2016-09-29 ENCOUNTER — Ambulatory Visit (INDEPENDENT_AMBULATORY_CARE_PROVIDER_SITE_OTHER): Payer: Medicare Other | Admitting: *Deleted

## 2016-09-29 ENCOUNTER — Other Ambulatory Visit (INDEPENDENT_AMBULATORY_CARE_PROVIDER_SITE_OTHER): Payer: Medicare Other

## 2016-09-29 ENCOUNTER — Ambulatory Visit (INDEPENDENT_AMBULATORY_CARE_PROVIDER_SITE_OTHER): Payer: Medicare Other | Admitting: Pulmonary Disease

## 2016-09-29 ENCOUNTER — Encounter: Payer: Self-pay | Admitting: Pulmonary Disease

## 2016-09-29 VITALS — BP 126/78 | HR 78 | Ht 63.0 in | Wt 217.0 lb

## 2016-09-29 DIAGNOSIS — J849 Interstitial pulmonary disease, unspecified: Secondary | ICD-10-CM

## 2016-09-29 DIAGNOSIS — Z5181 Encounter for therapeutic drug level monitoring: Secondary | ICD-10-CM

## 2016-09-29 DIAGNOSIS — G4733 Obstructive sleep apnea (adult) (pediatric): Secondary | ICD-10-CM | POA: Diagnosis not present

## 2016-09-29 DIAGNOSIS — J84112 Idiopathic pulmonary fibrosis: Secondary | ICD-10-CM | POA: Diagnosis not present

## 2016-09-29 DIAGNOSIS — Z9989 Dependence on other enabling machines and devices: Secondary | ICD-10-CM

## 2016-09-29 DIAGNOSIS — K219 Gastro-esophageal reflux disease without esophagitis: Secondary | ICD-10-CM | POA: Diagnosis not present

## 2016-09-29 LAB — PULMONARY FUNCTION TEST
DL/VA % PRED: 104 %
DL/VA: 4.28 ml/min/mmHg/L
DLCO UNC % PRED: 69 %
DLCO cor % pred: 65 %
DLCO cor: 15.11 ml/min/mmHg
DLCO unc: 15.83 ml/min/mmHg
FEF 25-75 PRE: 2.88 L/s
FEF2575-%Pred-Pre: 144 %
FEV1-%PRED-PRE: 79 %
FEV1-PRE: 2 L
FEV1FVC-%Pred-Pre: 117 %
FEV6-%Pred-Pre: 72 %
FEV6-PRE: 2.3 L
FEV6FVC-%PRED-PRE: 106 %
FVC-%PRED-PRE: 67 %
FVC-PRE: 2.31 L
Pre FEV1/FVC ratio: 87 %
Pre FEV6/FVC Ratio: 100 %

## 2016-09-29 LAB — HEPATIC FUNCTION PANEL
ALBUMIN: 4.7 g/dL (ref 3.5–5.2)
ALK PHOS: 97 U/L (ref 39–117)
ALT: 32 U/L (ref 0–53)
AST: 24 U/L (ref 0–37)
BILIRUBIN DIRECT: 0.1 mg/dL (ref 0.0–0.3)
TOTAL PROTEIN: 8 g/dL (ref 6.0–8.3)
Total Bilirubin: 0.7 mg/dL (ref 0.2–1.2)

## 2016-09-29 NOTE — Progress Notes (Signed)
SIX MIN WALK 09/29/2016 06/24/2016  Medications lipitor 40mg , xyrtec 10mg , plavix 75mg , fenofibrate 160mg , cozarr 100mg , toprol-xl 25mg , zantac 75mg  & effexor-xr 75 all taken at 7:15am Lipitor 40mg , Zyrtec 10mg , Plavix 75mg , Fenofibrate 160mg , Cozaar 100mg , Toprol-XL 25mg , Zantac 75mg  and Effexor-XR 75mg  ---ALL at 830am  Supplimental Oxygen during Test? (L/min) No No  Laps 7 9  Partial Lap (in Meters) 18 20  Baseline BP (sitting) 136/74 128/70  Baseline Heartrate 74 113  Baseline Dyspnea (Borg Scale) 0 0  Baseline Fatigue (Borg Scale) 0 0  Baseline SPO2 99 97  BP (sitting) 150/80 164/70  Heartrate 121 146  Dyspnea (Borg Scale) 0 1  Fatigue (Borg Scale) 0 1  SPO2 91 92  BP (sitting) 146/78 152/76  Heartrate 91 125  SPO2 99 100  Stopped or Paused before Six Minutes No No  Distance Completed 354 452  Tech Comments: test preformed with forehead probe. pt completed test with no desats or complaints.  pt walked a fast pace during the walk test, no desat. Pt c/o leg tightness at the end of the test - no pain

## 2016-09-29 NOTE — Progress Notes (Signed)
Subjective:    Patient ID: Gary Keller, male    DOB: 12/02/1950, 66 y.o.   MRN: [id number]  C.C.:  Follow-up for ILD, Severe OSA, GERD & Left Lower Lobe Nodule.Marland Kitchen  HPI ILD: Consistent with UIP/IPF. Serum workup negative. Does have prior exposure to birds as well as chemical/pesticides. He reports his breathing is unchanged. Baseline dyspnea on exertion. Cough seems to be improved. No wheezing.   Severe OSA: Prescribed CPAP at 14 cm H2O pressure. He reports he is sleeping with his machine every night. Reports minimal leaks. He does feel he is sleeping better with his machine.   GERD: Prescribed Zantac. No reflux, dyspepsia, or morning brash water taste.   Left lower lobe nodule: Seen on CT imaging and stable since 2008. No need for further imaging.  Review of Systems No chest pain or pressure. No fever, chills, or sweats. No new rashes. Does have a rash on his right arm and forearm.   No Known Allergies  Current Outpatient Prescriptions on File Prior to Visit  Medication Sig Dispense Refill  . atorvastatin (LIPITOR) 40 MG tablet Take 40 mg by mouth daily at 6 PM.     . cetirizine (ZYRTEC) 10 MG tablet Take 10 mg by mouth daily.     . clopidogrel (PLAVIX) 75 MG tablet Take 75 mg by mouth daily.    . fenofibrate 160 MG tablet Take 160 mg by mouth daily.     Marland Kitchen losartan (COZAAR) 100 MG tablet Take 1 tablet (100 mg total) by mouth daily. 90 tablet 3  . metoprolol succinate (TOPROL XL) 25 MG 24 hr tablet Take 1 tablet (25 mg total) by mouth daily. 90 tablet 3  . ranitidine (ZANTAC) 75 MG tablet Take 75 mg by mouth 2 (two) times daily.    Marland Kitchen venlafaxine XR (EFFEXOR-XR) 75 MG 24 hr capsule Take 75 mg by mouth daily with breakfast.      No current facility-administered medications on file prior to visit.     Past Medical History:  Diagnosis Date  . CAD (coronary artery disease)   . Depression   . Diabetes mellitus (HCC)    Borderline  . Encounter for screening for malignant  neoplasm of prostate   . HTN (hypertension)   . Hyperlipidemia   . Non morbid obesity due to excess calories   . Renal insufficiency   . Rhinitis, allergic     Past Surgical History:  Procedure Laterality Date  . APPENDECTOMY    . COLONOSCOPY WITH ESOPHAGOGASTRODUODENOSCOPY (EGD)    . LEFT HEART CATH  2006   Stent x3    Family History  Problem Relation Age of Onset  . Diabetes Brother   . Heart disease Brother   . Heart disease Father   . Hypertension Brother   . Stroke Mother   . Stroke Brother   . Lung disease Neg Hx   . Rheumatologic disease Neg Hx     Social History   Social History  . Marital status: Single    Spouse name: N/A  . Number of children: 3  . Years of education: N/A   Occupational History  .      Retired   Social History Main Topics  . Smoking status: Former Smoker    Packs/day: 1.00    Years: 30.00    Quit date: 05/16/2001  . Smokeless tobacco: Never Used  . Alcohol use 0.0 oz/week     Comment: Occasional  . Drug use: No  .  Sexual activity: Not Asked   Other Topics Concern  . None   Social History Narrative   [facility] (05/27/16):   Originally from [facility]. Has always lived in [place]. 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 Does have exposure to chemicals from spraying. No mold exposure. Remote cockatiel exposure in a previous home. No hot tub exposure. Enjoys watching his grandchildren and hunting.       Objective:   Physical Exam BP 126/78 (BP Location: Left Arm, Cuff Size: Normal)   Pulse 78   Ht '5\' 3"'$  (1.6 m)   Wt 217 lb (98.4 kg)   SpO2 96%   BMI 38.44 kg/m   Gen.: Morbidly obese. Comfortable. Son with patient today. Integument: No bruising. Patchy erythematous, nodular rash on right forearm with some central clearing. Warm. Pulmonary: Basilar Velcro crackles persist. Normal work of breathing on room air. Otherwise clear with auscultation. Cardiovascular: Regular rate. Regular rhythm. No  edema. HEENT: Moist and his membranes. No scleral icterus. No oral ulcers. Abdomen: Soft. Protuberant. Normal bowel sounds. Neurological: No meningismus. Cranial nerves grossly intact. Oriented 4.  PFT 09/29/16: FVC 2.31 L (67%) FEV1 2.00 L (79%) FEV1/FVC 0.87 FEF 25-75 2.88 L (144%)                                                                                                              DLCO corrected 65% 06/24/16: FVC 2.49 L (76%) FEV1 2.21 L (91%) FEV1/FVC 0.89 FEF 25-75 3.41 L (177%) negative bronchodilator response TLC 3.77 L (69%) RV 53% ERV 130% DLCO uncorrected 66%  6MWT 09/29/16:  Walked 354 meters / Baseline Sat 99% on RA / Nadir Sat 91% on RA @ end of test 06/24/16:  Walked 452 meters / Baseline Sat 97% on RA / Nadir Sat 92% on RA @ end of test  POLYSOMNOGRAM (06/07/16): Severe obstructive sleep apnea with AHI 15.0 events/hour. No significant central sleep apnea. Moderate desaturation during diagnostic portion of the study to 78% on room air. Loud snoring was noted. No cardiac abnormalities were noted. No clinically significant periodic leg movements. Optimal CPAP pressure 14 cm was found for the patient with a small fullface mask. REM supine sleep was not observed during titration.  IMAGING CT CHEST W/O 07/28/16 (personally reviewed by me):  1.1 CM subpleural nodule adjacent major fissure left lower lobe unchanged in size compared with imaging from 2008. No new nodule or opacity appreciated. No pleural effusion or thickening. Fibrotic changes again noted. No pericardial effusion. No pathologic mediastinal adenopathy.  HRCT CHEST W/O 05/31/16 (previously reviewed by me):  Patient does have minimal subpleural patchy groundglass. Traction bronchiectasis with areas of honeycombing and subpleural as well as central distribution of reticulation with intralobular septal thickening. There is a slight craniocaudal progression suggestive of a UIP pattern. Areas of mosaic attenuation consistent  with air trapping. No pleural effusion or thickening. Subcarinal and precarinal lymph nodes measuring up to 1.1 & 1.2 cm in short axis. No pericardial effusion. Patient does have an 1.1 CM  oblong nodule within his left lower lobe that corresponds to previously noted left lower lobe nodule.  CT CHEST W/ 04/20/16 (previously reviewed by me): No pleural effusion or thickening. No pathologic mediastinal adenopathy. No pericardial effusion. Patchy bronchiectasis and intralobular septal thickening with suggestion of honeycombing in the sulci bilaterally. There is a slight craniocaudal progression.Patient does indeed have an oblong dense 9 mm left lower lobe nodule.  CT CHEST W/ 04/20/16 (per radiologist): 9 mm left lower lobe nodule. Diffuse interlobular septal thickening with architectural distortion inferiorly with honeycombing versus bronchiectasis and medial right lower lobe. No pleural effusion or calcification. No cardiomegaly or pericardial effusion. No lymphadenopathy or mass with a mediastinum. Fatty infiltration of the liver noted.  CT CHEST W/ 3/6/9 (per radiologist): No mediastinal hematoma. No pneumothorax or pleural effusion. Irregular interstitial prominence in addition. Ill-defined cystic lucencies diffusely. Fracture of left 10th rib noted.  LABS 06/24/16 CRP:  <0.1 ESR: 6 Hypersensitivity pneumonitis panel: Negative Jo-1 Ab:  <0.2 Centromere Ab Screen:  <0.2 DS DNA Ab:  <0.2 Anti-CCP:  <16 DS DNA Ab:  1 RNP Ab:  0.2 SSA:  <0.2 SSB:  <0.2 SCL-70:  <0.2 Smith Ab:  <0.2 Chromatin Ab:  <0.2 Myositis Ab Panel:  Negative     Assessment & Plan:  66 y.o. male with ILD/IPF, severe OSA, left lower lobe nodule, & GERD. Patient's spirometry has significantly worsened and his walk test distance is down by 100 m. Patient believes he may have walked at a slightly slower pace than at prior visit but I am concerned that his interstitial lung disease is progressing. As such, we had a lengthy  discussion regarding initiating treatment with either Esbriet or Ofev. Given the patient's activity level and being outdoors I believe Ofev would be better choice of treatment. We briefly discussed the potential side effect profile as well as the need for routine monitoring of liver tests. We also discussed surgical lung biopsy but given his pattern on high-resolution CT scan and his comorbidities with thoracoscopic surgery I believe initiating treatment at this time is prudent and reasonable foregoing a surgical biopsy. We also discussed simply monitoring his pulmonary function and walk test but given the rapid rate of decline I'm concerned that he will further lose more lung function between now and his next appointment placing him at increased risk. I instructed the patient to contact my office if he had any new breathing problems or questions before his next appointment.  1. ILD/IPF:  Starting patient on treatment with Ofev. Patient counseled briefly on the risks and potential side effects of treatment. Checking hepatic function panel today and repeating every monthly until his next appointment with me. Repeat spirometry with DLCO and 6 minute walk test on room air at next appointment.  2. Severe OSA:  Continuing CPAP therapy indefinitely. Checking downloaded from machine. 3. Left lower lobe nodule: Stable since 2008. No need for further imaging. 4. GERD: Continuing Zantac. No changes. 5. Health maintenance: Status post influenza vaccine January 2018 & Pneumovax February 2018. Plan for Prevnar vaccine February 2019. 6. Follow-up: Return to clinic in 3 months or sooner if needed.  Sonia Baller Ashok Cordia, M.D. [facility] Pager:  [phone] After 3pm or if no response, call [phone] 10:57 AM 09/29/16

## 2016-09-29 NOTE — Progress Notes (Signed)
PFT done today. 

## 2016-09-29 NOTE — Patient Instructions (Signed)
   We are going to start you on Ofev to treat your interstitial lung disease.  Call me if you have any questions or problems about the new medication.  We will do a breathing & walking test at your next appointment just like today.  Remember to let me know what the dermatologist says about your rash.  TESTS ORDERED: TESTS ORDERED: 1. Spirometry with DLCO at next appointment 2. on room air at next appointment  3. Hepatic function panel today & q monthly until next appointment

## 2016-10-03 ENCOUNTER — Telehealth: Payer: Self-pay | Admitting: Pulmonary Disease

## 2016-10-03 NOTE — Telephone Encounter (Signed)
An OFEV prescription [facility] at [phone]. Nothing further is needed.

## 2016-10-06 ENCOUNTER — Telehealth: Payer: Self-pay | Admitting: Pulmonary Disease

## 2016-10-06 NOTE — Telephone Encounter (Signed)
Spoke with Marylene LandAngela. A PA is needed for Ofev 150mg . PA was started on [email or web address]. Key is [id number]. Response expected within the next 72 hours.   [facility]

## 2016-10-06 NOTE — Telephone Encounter (Signed)
PA was approved until 05/15/2017. [facility] is aware. Due to the high copay, they stated that they would work with the patient in regards to copay assistance.   Nothing further was needed during call.

## 2016-10-11 ENCOUNTER — Encounter: Payer: Self-pay | Admitting: Pulmonary Disease

## 2016-10-12 NOTE — Telephone Encounter (Signed)
5.29.18 [facility] patient: [facility]  to Roslynn AmbleJennings E Nestor, MD  5:25 PM  I am having issues getting my CPAP supplies from [facility]. Will my insurance pay for my supplies from [facility].    E-mail sent back to patient asking what kind of issues he's having and that [facility] while he is still using [facility].

## 2016-11-11 ENCOUNTER — Other Ambulatory Visit: Payer: Self-pay

## 2016-11-11 ENCOUNTER — Encounter: Payer: Self-pay | Admitting: Pulmonary Disease

## 2016-11-11 DIAGNOSIS — G4733 Obstructive sleep apnea (adult) (pediatric): Secondary | ICD-10-CM

## 2016-11-11 NOTE — Telephone Encounter (Signed)
That is fine with me.

## 2016-11-11 NOTE — Telephone Encounter (Signed)
JN- [facility]  Ok to switch?  Thanks!

## 2016-11-29 DIAGNOSIS — N1832 Chronic kidney disease, stage 3b: Secondary | ICD-10-CM | POA: Insufficient documentation

## 2017-01-05 ENCOUNTER — Other Ambulatory Visit: Payer: Self-pay | Admitting: Pulmonary Disease

## 2017-01-05 DIAGNOSIS — R0600 Dyspnea, unspecified: Secondary | ICD-10-CM

## 2017-01-06 ENCOUNTER — Encounter: Payer: Self-pay | Admitting: Pulmonary Disease

## 2017-01-06 ENCOUNTER — Ambulatory Visit (INDEPENDENT_AMBULATORY_CARE_PROVIDER_SITE_OTHER): Payer: Medicare Other | Admitting: Pulmonary Disease

## 2017-01-06 ENCOUNTER — Ambulatory Visit (INDEPENDENT_AMBULATORY_CARE_PROVIDER_SITE_OTHER): Payer: Medicare Other | Admitting: *Deleted

## 2017-01-06 VITALS — BP 142/76 | HR 106 | Ht 63.0 in | Wt 214.0 lb

## 2017-01-06 DIAGNOSIS — Z9989 Dependence on other enabling machines and devices: Secondary | ICD-10-CM

## 2017-01-06 DIAGNOSIS — G4733 Obstructive sleep apnea (adult) (pediatric): Secondary | ICD-10-CM

## 2017-01-06 DIAGNOSIS — J84112 Idiopathic pulmonary fibrosis: Secondary | ICD-10-CM

## 2017-01-06 DIAGNOSIS — K219 Gastro-esophageal reflux disease without esophagitis: Secondary | ICD-10-CM | POA: Diagnosis not present

## 2017-01-06 DIAGNOSIS — Z23 Encounter for immunization: Secondary | ICD-10-CM | POA: Diagnosis not present

## 2017-01-06 DIAGNOSIS — J849 Interstitial pulmonary disease, unspecified: Secondary | ICD-10-CM

## 2017-01-06 DIAGNOSIS — R0600 Dyspnea, unspecified: Secondary | ICD-10-CM

## 2017-01-06 LAB — PULMONARY FUNCTION TEST
DL/VA % pred: 119 %
DL/VA: 4.89 ml/min/mmHg/L
DLCO COR: 17.3 ml/min/mmHg
DLCO UNC: 17.35 ml/min/mmHg
DLCO cor % pred: 75 %
DLCO unc % pred: 75 %
FEF 25-75 PRE: 2.87 L/s
FEF2575-%Pred-Pre: 144 %
FEV1-%Pred-Pre: 81 %
FEV1-PRE: 2.03 L
FEV1FVC-%Pred-Pre: 119 %
FEV6-%PRED-PRE: 72 %
FEV6-Pre: 2.29 L
FEV6FVC-%Pred-Pre: 106 %
FVC-%Pred-Pre: 67 %
FVC-Pre: 2.29 L
Pre FEV1/FVC ratio: 88 %
Pre FEV6/FVC Ratio: 100 %

## 2017-01-06 NOTE — Progress Notes (Signed)
PFT done today. 

## 2017-01-06 NOTE — Patient Instructions (Addendum)
   Let me know if you don't receive your CPAP supplies.  We will do a breathing & walking test at your next appointment.   Call if you have any new breathing problems or questions before then.   TESTS ORDERED: 1. Spirometry with DLCO at next appointment 2. on room air at next appointment

## 2017-01-06 NOTE — Progress Notes (Signed)
SIX MIN WALK 01/06/2017 09/29/2016 06/24/2016  Medications pt stated that he took all of his meds this morning around 7 am lipitor 40mg , xyrtec 10mg , plavix 75mg , fenofibrate 160mg , cozarr 100mg , toprol-xl 25mg , zantac 75mg  & effexor-xr 75 all taken at 7:15am Lipitor 40mg , Zyrtec 10mg , Plavix 75mg , Fenofibrate 160mg , Cozaar 100mg , Toprol-XL 25mg , Zantac 75mg  and Effexor-XR 75mg  ---ALL at 830am  Supplimental Oxygen during Test? (L/min) No No No  Laps 8 7 9   Partial Lap (in Meters) 28 18 20   Baseline BP (sitting) 136/80 136/74 128/70  Baseline Heartrate 99 74 113  Baseline Dyspnea (Borg Scale) 0 0 0  Baseline Fatigue (Borg Scale) 0 0 0  Baseline SPO2 99 99 97  BP (sitting) 174/92 150/80 164/70  Heartrate 141 121 146  Dyspnea (Borg Scale) 0.5 0 1  Fatigue (Borg Scale) 2 0 1  SPO2 90 91 92  BP (sitting) 144/82 146/78 152/76  Heartrate 103 91 125  SPO2 100 99 100  Stopped or Paused before Six Minutes No No No  Distance Completed [phone]  Tech Comments: pt stated that he did have some sligh chest pressure but that is normal for him.  completed test without difficulty test preformed with forehead probe. pt completed test with no desats or complaints.  pt walked a fast pace during the walk test, no desat. Pt c/o leg tightness at the end of the test - no pain

## 2017-01-06 NOTE — Progress Notes (Signed)
Subjective:    Patient ID: Gary Keller, male    DOB: 03-05-1951, 66 y.o.   MRN: [id number]  C.C.:  Follow-up for ILD/IPF, Severe OSA, GERD & Left Lower Lobe Nodule.Marland Kitchen  HPI ILD/IPF: Serum workup negative. Prior exposure to birds as well as chemical/pesticides. 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 Recommended starting on Ofev at last appointment but patient decided against it given the risks and side effect profile. He reports his baseline dyspnea is unchanged. No new coughing or wheezing.   Severe OSA: Previously started on CPAP at 14 cm H2O pressure. Switch DME company since last appointment but wasn't able to given time constraints. He hasn't yet received supplies. He currently is with [employer].   GERD: Prescribed Zantac. No reflux, dyspepsia, or morning brash water taste.   Left lower lobe nodule: Seen on CT imaging and stable since 2008. No plans for further imaging.  Review of Systems No chest pressure. He does have some mild pain with stress. No fever or chills. No abdominal pain or nausea. Does have some mild diarrhea on Metformin.   No Known Allergies  Current Outpatient Prescriptions on File Prior to Visit  Medication Sig Dispense Refill  . atorvastatin (LIPITOR) 40 MG tablet Take 40 mg by mouth daily at 6 PM.     . cetirizine (ZYRTEC) 10 MG tablet Take 10 mg by mouth daily.     . clopidogrel (PLAVIX) 75 MG tablet Take 75 mg by mouth daily.    . fenofibrate 160 MG tablet Take 160 mg by mouth daily.     Marland Kitchen losartan (COZAAR) 100 MG tablet Take 1 tablet (100 mg total) by mouth daily. 90 tablet 3  . metoprolol succinate (TOPROL XL) 25 MG 24 hr tablet Take 1 tablet (25 mg total) by mouth daily. 90 tablet 3  . ranitidine (ZANTAC) 75 MG tablet Take 75 mg by mouth 2 (two) times daily.    Marland Kitchen venlafaxine XR (EFFEXOR-XR) 75 MG 24 hr capsule Take 75 mg by mouth daily with breakfast.      No current facility-administered medications on file prior to visit.     Past Medical History:    Diagnosis Date  . CAD (coronary artery disease)   . Depression   . Diabetes mellitus (HCC)    Borderline  . Encounter for screening for malignant neoplasm of prostate   . HTN (hypertension)   . Hyperlipidemia   . Non morbid obesity due to excess calories   . Renal insufficiency   . Rhinitis, allergic     Past Surgical History:  Procedure Laterality Date  . APPENDECTOMY    . COLONOSCOPY WITH ESOPHAGOGASTRODUODENOSCOPY (EGD)    . LEFT HEART CATH  2006   Stent x3    Family History  Problem Relation Age of Onset  . Diabetes Brother   . Heart disease Brother   . Heart disease Father   . Hypertension Brother   . Stroke Mother   . Stroke Brother   . Lung disease Neg Hx   . Rheumatologic disease Neg Hx     Social History   Social History  . Marital status: Single    Spouse name: N/A  . Number of children: 3  . Years of education: N/A   Occupational History  .      Retired   Social History Main Topics  . Smoking status: Former Smoker    Packs/day: 1.00    Years: 30.00    Quit date: 05/16/2001  .  Smokeless tobacco: Never Used  . Alcohol use 0.0 oz/week     Comment: Occasional  . Drug use: No  . Sexual activity: Not Asked   Other Topics Concern  . None   Social History Narrative   [facility] (05/27/16):   Originally from [facility]. Has always lived in [place]. 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 Does have exposure to chemicals from spraying. No mold exposure. Remote cockatiel exposure in a previous home. No hot tub exposure. Enjoys watching his grandchildren and hunting.       Objective:   Physical Exam BP (!) 142/76 (BP Location: Right Arm, Cuff Size: Normal)   Pulse (!) 106   Ht 5' 3"  (1.6 m)   Wt 214 lb (97.1 kg)   SpO2 98%   BMI 37.91 kg/m   General:  Obese. Accompanied by daughter today. No acute distress. Integument:  Warm & dry. Improving macular rash on right arm with central clearing. No bruising on exposed  skin. Extremities:  No cyanosis or clubbing.  HEENT: No nasal turbinate swelling. No scleral icterus. Moist membranes Cardiovascular:  Regular rate. No edema. Unable to appreciate JVD given body habitus. Pulmonary:  Basilar Velcro crackles relatively unchanged. No accessory muscle use on room air. Abdomen: Soft. Normal bowel sounds. Protuberant. Musculoskeletal:  Normal bulk and tone. No joint deformity or effusion appreciated.  PFT 01/06/17: FVC 2.29 L (67%) FEV1 2.03 L (81%) FEV1/FVC 0.88 FEF 25-75 2.87 L (144%)                                                                                                                             DLCO corrected 75% 09/29/16: FVC 2.31 L (67%) FEV1 2.00 L (79%) FEV1/FVC 0.87 FEF 25-75 2.88 L (144%)                                                                                                                             DLCO corrected 65% 06/24/16: FVC 2.49 L (76%) FEV1 2.21 L (91%) FEV1/FVC 0.89 FEF 25-75 3.41 L (177%) negative bronchodilator response TLC 3.77 L (69%) RV 53% ERV 130% DLCO uncorrected 66%  6MWT 01/06/17:  Walked 412 meters / Baseline Sat 99% on RA / Nadir Sat 90% on RA @ end of test 09/29/16:  Walked 354 meters / Baseline Sat 99% on RA / Nadir Sat 91% on RA @ end of test 06/24/16:  Walked 452 meters / Baseline Sat 97% on RA / Nadir Sat 92% on RA @ end of test  POLYSOMNOGRAM (06/07/16): Severe obstructive sleep apnea with AHI 15.0 events/hour. No significant central sleep apnea. Moderate desaturation during diagnostic portion of the study to 78% on room air. Loud snoring was noted. No cardiac abnormalities were noted. No clinically significant periodic leg movements. Optimal CPAP pressure 14 cm was found for the patient with a small fullface mask. REM supine sleep was not observed during titration.  IMAGING CT CHEST W/O 07/28/16 (previously reviewed by me):  1.1 CM subpleural nodule adjacent major fissure left lower lobe unchanged in size  compared with imaging from 2008. No new nodule or opacity appreciated. No pleural effusion or thickening. Fibrotic changes again noted. No pericardial effusion. No pathologic mediastinal adenopathy.  HRCT CHEST W/O 05/31/16 (previously reviewed by me):  Patient does have minimal subpleural patchy groundglass. Traction bronchiectasis with areas of honeycombing and subpleural as well as central distribution of reticulation with intralobular septal thickening. There is a slight craniocaudal progression suggestive of a UIP pattern. Areas of mosaic attenuation consistent with air trapping. No pleural effusion or thickening. Subcarinal and precarinal lymph nodes measuring up to 1.1 & 1.2 cm in short axis. No pericardial effusion. Patient does have an 1.1 CM oblong nodule within his left lower lobe that corresponds to previously noted left lower lobe nodule.  CT CHEST W/ 04/20/16 (previously reviewed by me): No pleural effusion or thickening. No pathologic mediastinal adenopathy. No pericardial effusion. Patchy bronchiectasis and intralobular septal thickening with suggestion of honeycombing in the sulci bilaterally. There is a slight craniocaudal progression.Patient does indeed have an oblong dense 9 mm left lower lobe nodule.  CT CHEST W/ 04/20/16 (per radiologist): 9 mm left lower lobe nodule. Diffuse interlobular septal thickening with architectural distortion inferiorly with honeycombing versus bronchiectasis and medial right lower lobe. No pleural effusion or calcification. No cardiomegaly or pericardial effusion. No lymphadenopathy or mass with a mediastinum. Fatty infiltration of the liver noted.  CT CHEST W/ 3/6/9 (per radiologist): No mediastinal hematoma. No pneumothorax or pleural effusion. Irregular interstitial prominence in addition. Ill-defined cystic lucencies diffusely. Fracture of left 10th rib noted.  LABS 06/24/16 CRP:  <0.1 ESR: 6 Hypersensitivity pneumonitis panel: Negative Jo-1 Ab:   <0.2 Centromere Ab Screen:  <0.2 DS DNA Ab:  <0.2 Anti-CCP:  <16 DS DNA Ab:  1 RNP Ab:  0.2 SSA:  <0.2 SSB:  <0.2 SCL-70:  <0.2 Smith Ab:  <0.2 Chromatin Ab:  <0.2 Myositis Ab Panel:  Negative     Assessment & Plan:  66 y.o. male with ILD/IVF, severe OSA, & GERD.  Patient's spirometry today and 6 minute walk test remains stable.  Despite his significant desaturation he exhibits no oxygen requirement with ambulation. Discussed treatment with Esbriet or Ofev again today and the patient wishes to defer initiation given potential side effects and limited clinical benefit. Overall his reflux is well controlled and I feel that given his continued stability and pulmonary function testing this is reasonable. I did caution the patient that he could experience clinical worsening and we may readdress this in the future. I instructed the patient to contact me if he had any new breathing problems or questions before his next appointment. I'm also concerned that he has yet to receive his CPAP supplies from his DME company. I requested that his daughter notify me if this continues to be an issue.  1. IPF:  Patient declines treatment at this time. Continuing to  monitor for clinical worsening with repeat spirometry with DLCO and 6 minute walk test on room air at next appointment. 2. Severe OSA: Patient's daughter to inquire regarding supplies for home CPAP. Patient has yet to receive them. 3. GERD: Continuing Zantac. Well-controlled. 4. Health maintenance: Status post Pneumovax February 2018. Plan for Prevnar vaccine February 2019. Administering influenza vaccine today. 5. Follow-up: Return to clinic in 6 months or sooner if needed.  Sonia Baller Ashok Cordia, M.D. [facility] Pager:  [phone] After 3pm or if no response, call [phone] 10:00 AM 01/06/17

## 2017-05-11 ENCOUNTER — Telehealth: Payer: Self-pay | Admitting: Pulmonary Disease

## 2017-05-11 NOTE — Telephone Encounter (Signed)
Pt is calling back [phone]

## 2017-05-11 NOTE — Telephone Encounter (Signed)
Spoke with Gary Keller, pt has an appt on 1/29 but the PFT and the 6 min walk was not scheduled. Appt made and nothing further is needed.

## 2017-05-11 NOTE — Telephone Encounter (Signed)
ATC pt, no answer. Left message for pt to call back.  

## 2017-06-13 ENCOUNTER — Ambulatory Visit (INDEPENDENT_AMBULATORY_CARE_PROVIDER_SITE_OTHER): Payer: Medicare Other | Admitting: Pulmonary Disease

## 2017-06-13 ENCOUNTER — Ambulatory Visit: Payer: Medicare Other

## 2017-06-13 ENCOUNTER — Ambulatory Visit: Payer: Medicare Other | Admitting: Pulmonary Disease

## 2017-06-13 ENCOUNTER — Encounter: Payer: Self-pay | Admitting: Pulmonary Disease

## 2017-06-13 ENCOUNTER — Institutional Professional Consult (permissible substitution): Payer: Medicare Other | Admitting: Pulmonary Disease

## 2017-06-13 DIAGNOSIS — J84112 Idiopathic pulmonary fibrosis: Secondary | ICD-10-CM | POA: Diagnosis not present

## 2017-06-13 DIAGNOSIS — G4733 Obstructive sleep apnea (adult) (pediatric): Secondary | ICD-10-CM

## 2017-06-13 DIAGNOSIS — J849 Interstitial pulmonary disease, unspecified: Secondary | ICD-10-CM | POA: Diagnosis not present

## 2017-06-13 LAB — PULMONARY FUNCTION TEST
DL/VA % pred: 108 %
DL/VA: 4.42 ml/min/mmHg/L
DLCO cor % pred: 72 %
DLCO cor: 16.53 ml/min/mmHg
DLCO unc % pred: 76 %
DLCO unc: 17.43 ml/min/mmHg
FEF 25-75 Pre: 3.02 L/sec
FEF2575-%Pred-Pre: 154 %
FEV1-%Pred-Pre: 85 %
FEV1-Pre: 2.13 L
FEV1FVC-%Pred-Pre: 119 %
FEV6-%Pred-Pre: 75 %
FEV6-Pre: 2.41 L
FEV6FVC-%Pred-Pre: 106 %
FVC-%Pred-Pre: 71 %
FVC-Pre: 2.41 L
Pre FEV1/FVC ratio: 88 %
Pre FEV6/FVC Ratio: 100 %

## 2017-06-13 NOTE — Assessment & Plan Note (Signed)
IPF appears stable by lung function. Symptoms are in fact improved which is probably due to weight loss. He is very active physically and therefore will defer pulmonary rehab for now. We will monitor lung function every 6 months and currently is not interested in pursuing anti-fibrotic therapies after reviewing side effect profile but we will push this should we see a drop in his lung function in the future. I also reviewed prognosis of IPF with him and his daughter

## 2017-06-13 NOTE — Patient Instructions (Signed)
Pulmonary fibrosis appears stable by lung function Call us if you ever catch a chest cold.  Continue using your CPAP machine at least 6 hours every night We will check a report and give you feedback

## 2017-06-13 NOTE — Progress Notes (Signed)
PFT completed today 06/13/17  

## 2017-06-13 NOTE — Progress Notes (Signed)
Subjective:    Patient ID: Gary Keller, male    DOB: 10/24/1950, 67 y.o.   MRN: [id number]  HPI  67 year old man presents to establish care for OSA and idiopathic pulmonary fibrosis. This was diagnosed on high-resolution CT chest, PFTs have shown mild restriction. Autoimmune extensive workup was negative His daughter is a respiratory therapist [facility]  He had extensive discussions with my partner Dr. Jamison Neighbor regarding anti-fibrotic therapies and decided after reviewing side effect profile that he did not want to pursue this. His breathing has been stable over the past few months, he has lost 20 pounds due to change in his diet and after starting on CPAP therapy and he finds that he can walk up a hill now.  He walks about 2 miles daily PFTs were reviewed today which show That lung function is maintained  He feels better rested now that he is started back on CPAP machine.  He has settled down with a full facemask. CPAP download was reviewed which shows excellent control of events on 14 cm with minimal leak and good compliance about more than 8 hours every night He feels rested on waking up and denies dryness of mouth or headaches   PFT 05/2017 ratio 88, FVC 71%, DLCO 76%  01/06/17: FVC 2.29 L (67%) FEV1 2.03 L (81%) FEV1/FVC 0.88   DLCO corrected 75%  06/24/16: FVC 2.49 L (76%) FEV1 2.21 L (91%) FEV1/FVC 0.89 FEF 25-75 3.41 L (177%) negative bronchodilator response TLC 3.77 L (69%) RV 53% ERV 130% DLCO uncorrected 66%  01/06/17:  Walked 412 meters / Baseline Sat 99% on RA / Nadir Sat 90% on RA @ end of test 09/29/16:  Walked 354 meters / Baseline Sat 99% on RA / Nadir Sat 91% on RA @ end of test 06/24/16: Walked 452 meters / Baseline Sat 97% on RA / Nadir Sat 92% on RA @ end of test  PSG(06/07/16): AHI 15.0 events/hour., nadir satn 78%   >> Optimal CPAP pressure 14 cm   IMAGING CT CHEST W/O 07/2016 :  1.1 CM subpleural nodule adjacent major fissure left lower  lobe unchanged in size compared with imaging from 2008. Fibrotic changes again noted.   HRCT CHEST W/O 05/2016  Patient does have minimal subpleural patchy groundglass. Traction bronchiectasis with areas of honeycombing and subpleural as well as central distribution of reticulation with intralobular septal thickening. There is aslight craniocaudal progression suggestive of a UIP pattern.  Subcarinal and precarinal lymph nodes measuring up to 1.1 &1.2 cm in short axis. Patient does have an 1.1 CM oblong nodule within his left lower lobe that corresponds to previously noted left lower lobe nodule.   CT CHEST W/ 04/20/16 (per radiologist):9 mm left lower lobe nodule. Diffuse interlobular septal thickening with architectural distortion inferiorly with honeycombing versus bronchiectasis and medial right lower lobe. No pleural effusion or calcification. No cardiomegaly or pericardial effusion. No lymphadenopathy or mass with a mediastinum. Fatty infiltration of the liver noted.  CT CHEST W/ 3/6/9 (per radiologist):Marland Kitchen Irregular interstitial prominence in addition. Ill-defined cystic lucencies diffusely. Fracture of left 10th rib noted.   Past Medical History:  Diagnosis Date  . CAD (coronary artery disease)   . Depression   . Diabetes mellitus (HCC)    Borderline  . Encounter for screening for malignant neoplasm of prostate   . HTN (hypertension)   . Hyperlipidemia   . Non morbid obesity due to excess calories   . Renal insufficiency   .  Rhinitis, allergic     Review of Systems neg for any significant sore throat, dysphagia, itching, sneezing, nasal congestion or excess/ purulent secretions, fever, chills, sweats, unintended wt loss, pleuritic or exertional cp, hempoptysis, orthopnea pnd or change in chronic leg swelling. Also denies presyncope, palpitations, heartburn, abdominal pain, nausea, vomiting, diarrhea or change in bowel or urinary habits, dysuria,hematuria, rash, arthralgias,  visual complaints, headache, numbness weakness or ataxia.     Objective:   Physical Exam   Gen. Pleasant, obese, in no distress, normal affect ENT - short neck, no post nasal drip, class 2-3 airway Neck: No JVD, no thyromegaly, no carotid bruits Lungs: no use of accessory muscles, no dullness to percussion, bibasal 1/3 rales no rhonchi  Cardiovascular: Rhythm regular, heart sounds  normal, no murmurs or gallops, no peripheral edema Abdomen: soft and non-tender, no hepatosplenomegaly, BS normal. Musculoskeletal: No deformities, no cyanosis or clubbing Neuro:  alert, non focal, no tremors        Assessment & Plan:

## 2017-06-13 NOTE — Assessment & Plan Note (Signed)
Symptoms much improved after using CPAP  Weight loss encouraged, compliance with goal of at least 4-6 hrs every night is the expectation. Advised against medications with sedative side effects Cautioned against driving when sleepy - understanding that sleepiness will vary on a day to day basis

## 2017-07-17 NOTE — Progress Notes (Signed)
HPI: FU CAD. Cardiac catheterization 6/10 revealed normal LV function. There was significant coronary disease and he had PCI of his posterior lateral and right coronary artery. He also had PCI of his PDA. Note all of these were drug-eluting stents. Abdominal ultrasound in January of 2011 showed no aneurysm. Nuclear study November 2016 showed ejection fraction 56%. There were ECG changes but perfusion normal. Abd ultrasound 12/17 showed no aneurysm. Since last seen, the patient denies any dyspnea on exertion, orthopnea, PND, pedal edema, palpitations, syncope or chest pain.   Current Outpatient Medications  Medication Sig Dispense Refill  . atorvastatin (LIPITOR) 40 MG tablet Take 40 mg by mouth daily at 6 PM.     . cetirizine (ZYRTEC) 10 MG tablet Take 10 mg by mouth daily.     . clopidogrel (PLAVIX) 75 MG tablet Take 75 mg by mouth daily.    . fenofibrate 160 MG tablet Take 160 mg by mouth daily.     Marland Kitchen losartan (COZAAR) 100 MG tablet Take 1 tablet (100 mg total) by mouth daily. 90 tablet 3  . metFORMIN (GLUCOPHAGE) 1000 MG tablet Take 1,000 mg by mouth 2 (two) times daily with a meal.    . metoprolol succinate (TOPROL XL) 25 MG 24 hr tablet Take 1 tablet (25 mg total) by mouth daily. 90 tablet 3  . Omega-3 Fatty Acids (FISH OIL) 1000 MG CAPS Take 1,000 mg by mouth daily.    . ranitidine (ZANTAC) 75 MG tablet Take 75 mg by mouth 2 (two) times daily.    Marland Kitchen venlafaxine XR (EFFEXOR-XR) 75 MG 24 hr capsule Take 75 mg by mouth daily with breakfast.      No current facility-administered medications for this visit.      Past Medical History:  Diagnosis Date  . CAD (coronary artery disease)   . Depression   . Diabetes mellitus (HCC)    Borderline  . Encounter for screening for malignant neoplasm of prostate   . HTN (hypertension)   . Hyperlipidemia   . Non morbid obesity due to excess calories   . Renal insufficiency   . Rhinitis, allergic     Past Surgical History:  Procedure  Laterality Date  . APPENDECTOMY    . COLONOSCOPY WITH ESOPHAGOGASTRODUODENOSCOPY (EGD)    . LEFT HEART CATH  2006   Stent x3    Social History   Socioeconomic History  . Marital status: Single    Spouse name: Not on file  . Number of children: 3  . Years of education: Not on file  . Highest education level: Not on file  Social Needs  . Financial resource strain: Not on file  . Food insecurity - worry: Not on file  . Food insecurity - inability: Not on file  . Transportation needs - medical: Not on file  . Transportation needs - non-medical: Not on file  Occupational History    Comment: Retired  Tobacco Use  . Smoking status: Former Smoker    Packs/day: 1.00    Years: 30.00    Pack years: 30.00    Last attempt to quit: 05/16/2001    Years since quitting: 16.2  . Smokeless tobacco: Never Used  Substance and Sexual Activity  . Alcohol use: Yes    Alcohol/week: 0.0 oz    Comment: Occasional  . Drug use: No  . Sexual activity: Not on file  Other Topics Concern  . Not on file  Social History Narrative   [facility] (05/27/16):  Originally from [place]. 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 Does have exposure to chemicals from spraying. No mold exposure. Remote cockatiel exposure in a previous home. No hot tub exposure. Enjoys watching his grandchildren and hunting.     Family History  Problem Relation Age of Onset  . Diabetes Brother   . Heart disease Brother   . Heart disease Father   . Hypertension Brother   . Stroke Mother   . Stroke Brother   . Lung disease Neg Hx   . Rheumatologic disease Neg Hx     ROS: no fevers or chills, productive cough, hemoptysis, dysphasia, odynophagia, melena, hematochezia, dysuria, hematuria, rash, seizure activity, orthopnea, PND, pedal edema, claudication. Remaining systems are negative.  Physical Exam: Well-developed obese in no acute distress.  Skin is warm and dry.    HEENT is normal.  Neck is supple.  Chest is clear to auscultation with normal expansion.  Cardiovascular exam is regular rate and rhythm.  Abdominal exam nontender or distended. No masses palpated. Extremities show no edema. neuro grossly intact  ECG-sinus rhythm at a rate of 74.  No ST changes.  Personally reviewed  A/P  1 coronary artery disease-patient has not had chest pain.  Continue medical therapy including plavix (pt prefers plavix compared to ASA) and statin.  2 hypertension-blood pressure is controlled.  Continue present medications and follow.  Potassium and renal function monitored by primary care.   3 hyperlipidemia-continue statin.  Lipids and liver monitored by primary care.  4 obesity-patient has lost 17 pounds recently and we discussed the importance of diet and exercise.  5 interstitial lung disease-managed by pulmonary.  Olga MillersBrian Jayd Forrey, MD

## 2017-07-25 ENCOUNTER — Encounter: Payer: Self-pay | Admitting: Cardiology

## 2017-07-25 ENCOUNTER — Ambulatory Visit: Payer: Medicare Other | Admitting: Cardiology

## 2017-07-25 VITALS — BP 122/78 | HR 82 | Resp 20 | Ht 62.0 in | Wt 201.4 lb

## 2017-07-25 DIAGNOSIS — E7800 Pure hypercholesterolemia, unspecified: Secondary | ICD-10-CM | POA: Diagnosis not present

## 2017-07-25 DIAGNOSIS — I10 Essential (primary) hypertension: Secondary | ICD-10-CM

## 2017-07-25 DIAGNOSIS — I2510 Atherosclerotic heart disease of native coronary artery without angina pectoris: Secondary | ICD-10-CM

## 2017-07-25 NOTE — Patient Instructions (Signed)
Medication Instructions:  Your physician recommends that you continue on your current medications as directed. Please refer to the Current Medication list given to you today.   Labwork: none  Testing/Procedures: none  Follow-Up: Your physician wants you to follow-up in: 12 months with Dr. Crenshaw. You will receive a reminder letter in the mail two months in advance. If you don't receive a letter, please call our office to schedule the follow-up appointment.   Any Other Special Instructions Will Be Listed Below (If Applicable).     If you need a refill on your cardiac medications before your next appointment, please call your pharmacy.   

## 2017-07-25 NOTE — Addendum Note (Signed)
Addended by: Garfield CorneaMABRY, Lilo Wallington L on: 07/25/2017 09:59 AM   Modules accepted: Orders

## 2017-10-13 ENCOUNTER — Ambulatory Visit: Payer: Medicare Other | Admitting: Adult Health

## 2017-10-13 ENCOUNTER — Encounter: Payer: Self-pay | Admitting: Adult Health

## 2017-10-13 DIAGNOSIS — J849 Interstitial pulmonary disease, unspecified: Secondary | ICD-10-CM

## 2017-10-13 DIAGNOSIS — G4733 Obstructive sleep apnea (adult) (pediatric): Secondary | ICD-10-CM | POA: Diagnosis not present

## 2017-10-13 DIAGNOSIS — E6609 Other obesity due to excess calories: Secondary | ICD-10-CM

## 2017-10-13 NOTE — Progress Notes (Signed)
 @Patient  ID: Gary Keller, male    DOB: 05/06/1951, 67 y.o.   MRN: [id number]  Chief Complaint  Patient presents with  . 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 PFT showed mild restriction Autoimmune extensive work-up was negative Patient has declined anti-fibrotic therapies  PFT 05/2017 ratio 88, FVC 71%, DLCO 76%  01/06/17:FVC 2.29 L (67%) FEV1 2.03 L (81%) FEV1/FVC 0.88 DLCO corrected 75%  06/24/16: FVC 2.49 L (76%) FEV1 2.21 L (91%) FEV1/FVC 0.89 FEF 25-75 3.41 L (177%) negative bronchodilator response TLC 3.77 L (69%) RV 53% ERV 130% DLCO uncorrected 66%  6MWT 01/06/17:Walked 412 meters / Baseline Sat 99% on RA / Nadir Sat 90% on RA @ end of test 09/29/16: Walked 354 meters / Baseline Sat 99% on RA / Nadir Sat 91% on RA @ end of test 06/24/16: Walked 452 meters / Baseline Sat 97% on RA / Nadir Sat 92% on RA @ end of test  PSG(06/07/16): AHI 15.0 events/hour., nadir satn 78%   >> Optimal CPAP pressure 14 cm   IMAGING CT CHEST W/O 07/2016 : 1.1 CM subpleural nodule adjacent major fissure left lower lobe unchanged in size compared with imaging from 2008. Fibrotic changes again noted.   HRCT CHEST W/O 05/2016 Patient does have minimal subpleural patchy groundglass. Traction bronchiectasis with areas of honeycombing and subpleural as well as central distribution of reticulation with intralobular septal thickening. There is aslight craniocaudal progression suggestive of a UIP pattern.  Subcarinal and precarinal lymph nodes measuring up to 1.1 &1.2 cm in short axis. Patient does have an 1.1 CM oblong nodule within his left lower lobe that corresponds to previously noted left lower lobe nodule.   CT CHEST  W/ 04/20/16 (per radiologist):9 mm left lower lobe nodule. Diffuse interlobular septal thickening with architectural distortion inferiorly with honeycombing versus bronchiectasis and medial right lower lobe. No pleural effusion or calcification. No cardiomegaly or pericardial effusion. No lymphadenopathy or mass with a mediastinum. Fatty infiltration of the liver noted.  CT CHEST W/ 3/6/9 (per radiologist):Marland Kitchen. Irregular interstitial prominence in addition. Ill-defined cystic lucencies diffusely. Fracture of left 10th rib noted. .  10/13/2017 Follow up : ILD and OSA  Patient presents for a four-month follow-up.  Patient has underlying pulmonary  fibrosis.  Says overall he is doing okay.  Tries to stay active.  Keeps grandkids. Says he is really doing well walking . Has been on healthy diet , went to healthy weight program , with high protein diet , lost 20lbs .  Has not seen any decrease in activity tolerance or increased shortness of breath with activity. Denies flare of cough.  Patient has known sleep apnea.  Is on CPAP at bedtime.  Patient says he is doing well with no significant daytime sleepiness.  Download shows excellent compliance with average usage at 8 hours.  She is on CPAP 14 cm H2O.  AHI is 1.2.  Minimum leaks.   No Known Allergies  Immunization History  Administered Date(s) Administered  . Influenza,inj,Quad PF,6+ Mos 05/27/2016, 01/06/2017  . Pneumococcal Polysaccharide-23 06/24/2016    Past Medical History:  Diagnosis Date  . CAD (coronary artery disease)   . Depression   . Diabetes mellitus (HCC)    Borderline  . Encounter for screening  for malignant neoplasm of prostate   . HTN (hypertension)   . Hyperlipidemia   . Non morbid obesity due to excess calories   . Renal insufficiency   . Rhinitis, allergic     Tobacco History: Social History   Tobacco Use  Smoking Status Former Smoker  . Packs/day: 1.00  . Years: 30.00  . Pack years: 30.00  . Last attempt to  quit: 05/16/2001  . Years since quitting: 16.4  Smokeless Tobacco Never Used   Counseling given: Not Answered   Outpatient Encounter Medications as of 10/13/2017  Medication Sig  . atorvastatin (LIPITOR) 40 MG tablet Take 40 mg by mouth daily at 6 PM.   . cetirizine (ZYRTEC) 10 MG tablet Take 10 mg by mouth daily.   . clopidogrel (PLAVIX) 75 MG tablet Take 75 mg by mouth daily.  . fenofibrate 160 MG tablet Take 160 mg by mouth daily.   . metFORMIN (GLUCOPHAGE) 1000 MG tablet Take 1,000 mg by mouth 2 (two) times daily with a meal.  . metoprolol succinate (TOPROL XL) 25 MG 24 hr tablet Take 1 tablet (25 mg total) by mouth daily.  . Omega-3 Fatty Acids (FISH OIL) 1000 MG CAPS Take 1,000 mg by mouth daily.  . ranitidine (ZANTAC) 75 MG tablet Take 75 mg by mouth 2 (two) times daily.  Marland Kitchen venlafaxine XR (EFFEXOR-XR) 75 MG 24 hr capsule Take 75 mg by mouth daily with breakfast.   . losartan (COZAAR) 100 MG tablet Take 1 tablet (100 mg total) by mouth daily.   No facility-administered encounter medications on file as of 10/13/2017.      Review of Systems  Constitutional:   No  weight loss, night sweats,  Fevers, chills, fatigue, or  lassitude.  HEENT:   No headaches,  Difficulty swallowing,  Tooth/dental problems, or  Sore throat,                No sneezing, itching, ear ache, nasal congestion, post nasal drip,   CV:  No chest pain,  Orthopnea, PND, swelling in lower extremities, anasarca, dizziness, palpitations, syncope.   GI  No heartburn, indigestion, abdominal pain, nausea, vomiting, diarrhea, change in bowel habits, loss of appetite, bloody stools.   Resp .  No excess mucus, no productive cough,  No non-productive cough,  No coughing up of blood.  No change in color of mucus.  No wheezing.  No chest wall deformity  Skin: no rash or lesions.  GU: no dysuria, change in color of urine, no urgency or frequency.  No flank pain, no hematuria   MS:  No joint pain or swelling.  No decreased  range of motion.  No back pain.    Physical Exam  BP 130/76 (BP Location: Left Arm, Cuff Size: Normal)   Pulse 86   Ht 5\' 2"  (1.575 m)   Wt 195 lb (88.5 kg)   SpO2 94%   BMI 35.67 kg/m   GEN: A/Ox3; pleasant , NAD, well nourished    HEENT:  [place]/AT,  EACs-clear, TMs-wnl, NOSE-clear, THROAT-clear, no lesions, no postnasal drip or exudate noted. Class 2 MP airway   NECK:  Supple w/ fair ROM; no JVD; normal carotid impulses w/o bruits; no thyromegaly or nodules palpated; no lymphadenopathy.    RESP  Faint BB crackles no accessory muscle use, no dullness to percussion  CARD:  RRR, no m/r/g, no peripheral edema, pulses intact, no cyanosis or clubbing.  GI:   Soft & nt; nml bowel sounds; no organomegaly or  masses detected.   Musco: Warm bil, no deformities or joint swelling noted.   Neuro: alert, no focal deficits noted.    Skin: Warm, no lesions or rashes    Lab Results:   BNP No results found for: BNP  ProBNP No results found for: PROBNP  Imaging: No results found.   Assessment & Plan:   ILD (interstitial lung disease) (HCC) Appears stable without flare  Plan  Patient Instructions  Continue on CPAP at bedtime Keep up the good work Work on [employer] Do not drive a sleepy Continue on current regimen Follow-up with Dr. Vassie Loll in 4 to 6 months and as needed     OSA (obstructive sleep apnea) Excellent control on CPAP   Plan  Patient Instructions  Continue on CPAP at bedtime Keep up the good work Work on [employer] Do not drive a sleepy Continue on current regimen Follow-up with Dr. Vassie Loll in 4 to 6 months and as needed     Non morbid obesity due to excess calories Doing well with wt loss . And healthy diet       Rubye Oaks, NP 10/13/2017

## 2017-10-13 NOTE — Assessment & Plan Note (Signed)
Doing well with wt loss . And healthy diet

## 2017-10-13 NOTE — Patient Instructions (Signed)
Continue on CPAP at bedtime Keep up the good work Work on [employer] Do not drive a sleepy Continue on current regimen Follow-up with Dr. Vassie Loll in 4 to 6 months and as needed

## 2017-10-13 NOTE — Assessment & Plan Note (Signed)
Appears stable without flare  Plan  Patient Instructions  Continue on CPAP at bedtime Keep up the good work Work on healthy weight Do not drive a sleepy Continue on current regimen Follow-up with Dr. Vassie Loll in 4 to 6 months and as needed

## 2017-10-13 NOTE — Assessment & Plan Note (Signed)
Excellent control on CPAP   Plan  Patient Instructions  Continue on CPAP at bedtime Keep up the good work Work on [employer] Do not drive a sleepy Continue on current regimen Follow-up with Dr. Vassie Loll in 4 to 6 months and as needed

## 2017-10-30 NOTE — Progress Notes (Signed)
Reviewed & agree with plan  

## 2018-02-13 ENCOUNTER — Ambulatory Visit: Payer: Medicare Other | Admitting: Pulmonary Disease

## 2018-02-13 ENCOUNTER — Encounter: Payer: Self-pay | Admitting: Pulmonary Disease

## 2018-02-13 VITALS — BP 110/68 | HR 82 | Ht 62.0 in | Wt 198.2 lb

## 2018-02-13 DIAGNOSIS — Z23 Encounter for immunization: Secondary | ICD-10-CM | POA: Diagnosis not present

## 2018-02-13 DIAGNOSIS — G4733 Obstructive sleep apnea (adult) (pediatric): Secondary | ICD-10-CM | POA: Diagnosis not present

## 2018-02-13 DIAGNOSIS — J849 Interstitial pulmonary disease, unspecified: Secondary | ICD-10-CM | POA: Diagnosis not present

## 2018-02-13 NOTE — Assessment & Plan Note (Addendum)
Flu shot today. Schedule high-resolution CT chest without contrast to follow-up on fibrosis. Lung function appears stable -66%, PFTs next visit  Again we will defer anti-fibrotic therapies for him based on our discussions.  If lung function dropped we will bring this up again.  He understands the role of anti-fibrotic medications to delay progression of disease

## 2018-02-13 NOTE — Progress Notes (Signed)
Gary [phone]  Assessment & Plan:

## 2018-02-13 NOTE — Assessment & Plan Note (Signed)
CPAP seems to be working well. We will change DME to [facility] He is certainly compliant and CPAP has definitely helped improve his daytime somnolence and fatigue

## 2018-02-13 NOTE — Patient Instructions (Addendum)
Flu shot today. Schedule high-resolution CT chest without contrast to follow-up on fibrosis. Lung function appears stable PFTs on next visit in 6 months  CPAP seems to be working well. We will change DME [facility]

## 2018-02-23 ENCOUNTER — Ambulatory Visit (INDEPENDENT_AMBULATORY_CARE_PROVIDER_SITE_OTHER)
Admission: RE | Admit: 2018-02-23 | Discharge: 2018-02-23 | Disposition: A | Discharge status: Home / Self Care | Payer: Medicare Other | Source: Ambulatory Visit | Attending: Pulmonary Disease | Admitting: Pulmonary Disease

## 2018-02-23 DIAGNOSIS — J849 Interstitial pulmonary disease, unspecified: Secondary | ICD-10-CM | POA: Diagnosis not present

## 2018-07-06 ENCOUNTER — Ambulatory Visit (INDEPENDENT_AMBULATORY_CARE_PROVIDER_SITE_OTHER): Payer: Medicare Other | Admitting: Pulmonary Disease

## 2018-07-06 ENCOUNTER — Ambulatory Visit: Payer: Medicare Other | Admitting: Pulmonary Disease

## 2018-07-06 ENCOUNTER — Encounter: Payer: Self-pay | Admitting: Pulmonary Disease

## 2018-07-06 DIAGNOSIS — J849 Interstitial pulmonary disease, unspecified: Secondary | ICD-10-CM

## 2018-07-06 DIAGNOSIS — G4733 Obstructive sleep apnea (adult) (pediatric): Secondary | ICD-10-CM

## 2018-07-06 DIAGNOSIS — K219 Gastro-esophageal reflux disease without esophagitis: Secondary | ICD-10-CM | POA: Diagnosis not present

## 2018-07-06 NOTE — Assessment & Plan Note (Signed)
Appears stable by symptoms  We will repeat PFTs in 4 months.  We again discussed anti-fibrotic therapy and he prefers to hold off May be interested in research studies  Does desaturate but not to the point of requiring oxygen

## 2018-07-06 NOTE — Assessment & Plan Note (Signed)
CPAP is working well on current settings. He is compliant and CPAP is definitely helped improve his daytime somnolence and fatigue  Weight loss encouraged, compliance with goal of at least 4-6 hrs every night is the expectation. Advised against medications with sedative side effects Cautioned against driving when sleepy - understanding that sleepiness will vary on a day to day basis

## 2018-07-06 NOTE — Assessment & Plan Note (Signed)
Postprandial cough may be related to ILD or GERD. Continue ranitidine. Nonpharmacological measures discussed

## 2018-07-06 NOTE — Progress Notes (Signed)
   Subjective:    Patient ID: Gary Keller, male    DOB: April 28, 1951, 68 y.o.   MRN: [id number]  HPI  68 yo for FU of OSA and idiopathic pulmonary fibrosis  For routine follow-up 4 months today. Reports cough after meals.  Takes ranitidine, no nocturnal symptoms. Breathing is in fact improved, he was able to walk for 30 minutes when he went hunting few days ago. CPAP is working well, wakes up feeling rested, no problems with mask or pressure. Download shows excellent compliance on 14 cm, no residuals and minimal leak  Oxygen saturation dropped to 90% with heart rate increasing from 89 to 111 and recovered after resting for 2 minutes   PFT 02/2018 FVC 66%  1/2019ratio 88, FVC 71%, DLCO 76%  01/06/17:FVC 2.29 L (67%) FEV1 2.03 L (81%) FEV1/FVC 0.88 DLCO corrected 75%  06/24/16: FVC 2.49 L (76%) FEV1 2.21 L (91%) FEV1/FVC 0.89  negative bronchodilator response TLC 3.77 L (69%)  DLCO uncorrected 66%  01/06/17:Walked 412 meters / Baseline Sat 99% on RA / Nadir Sat 90% on RA @ end of test   PSG(06/07/16): AHI 15.0 events/hour., nadir satn78%>>Optimal CPAP pressure 14 cm   IMAGING CT CHEST W/O 07/2016 : 1.1 CM subpleural nodule adjacent major fissure left lower lobe unchanged in size compared with imaging from 2008. Fibrotic changes again noted.   HRCT CHEST W/O 05/2016  minimal subpleural patchy groundglass. Traction bronchiectasis with areas of honeycombing and subpleural as well as central distribution of reticulation with intralobular septal thickening. There is aslight craniocaudal progression suggestive of a UIP pattern. Subcarinal and precarinal lymph nodes measuring up to 1.1 &1.2 cm in short axis. Patient does have an 1.1 CM oblong nodule within his left lower lobe that corresponds to previously noted left lower lobe nodule.   CT CHEST W/ 04/2016 :9 mm left lower lobe nodule. Diffuse interlobular septal thickening with architectural distortion  inferiorly with honeycombing versus bronchiectasis and medial right lower lobe.  Fatty infiltration of the liver noted.  CT CHEST W/ 07/2007:. Irregular interstitial prominence in addition. Ill-defined cystic lucencies diffusely. Fracture of left 10th rib noted.  Past Medical History:  Diagnosis Date  . CAD (coronary artery disease)   . Depression   . Diabetes mellitus (HCC)    Borderline  . Encounter for screening for malignant neoplasm of prostate   . HTN (hypertension)   . Hyperlipidemia   . Non morbid obesity due to excess calories   . Renal insufficiency   . Rhinitis, allergic     Review of Systems neg for any significant sore throat, dysphagia, itching, sneezing, nasal congestion or excess/ purulent secretions, fever, chills, sweats, unintended wt loss, pleuritic or exertional cp, hempoptysis, orthopnea pnd or change in chronic leg swelling. Also denies presyncope, palpitations, heartburn, abdominal pain, nausea, vomiting, diarrhea or change in bowel or urinary habits, dysuria,hematuria, rash, arthralgias, visual complaints, headache, numbness weakness or ataxia.     Objective:   Physical Exam  Gen. Pleasant, obese, in no distress ENT - no lesions, no post nasal drip Neck: No JVD, no thyromegaly, no carotid bruits Lungs: no use of accessory muscles, no dullness to percussion, bibasal rales no rhonchi  Cardiovascular: Rhythm regular, heart sounds  normal, no murmurs or gallops, no peripheral edema Musculoskeletal: No deformities, no cyanosis or clubbing , no tremors        Assessment & Plan:

## 2018-07-06 NOTE — Patient Instructions (Signed)
Fibrosis appears stable. PFTs at next visit in 4 months  Call if cough or breathing gets worse.  CPAP is working well at current settings

## 2018-09-05 NOTE — Progress Notes (Signed)
Virtual Visit via Video Note changed to telephone visit at patient request (no smart phone)   This visit type was conducted due to national recommendations for restrictions regarding the COVID-19 Pandemic (e.g. social distancing) in an effort to limit this patient's exposure and mitigate transmission in our community.  Due to his co-morbid illnesses, this patient is at least at moderate risk for complications without adequate follow up.  This format is felt to be most appropriate for this patient at this time.  All issues noted in this document were discussed and addressed.  A limited physical exam was performed with this format.  Please refer to the patient's chart for his consent to telehealth for [facility].   Evaluation Performed:  Follow-up visit  Date:  09/13/2018   ID:  Gary Keller, Gary Keller 04/30/1951, MRN [id number]  Patient Location: Home Provider Location: Home  PCP:  Elizabeth Palau, FNP  Cardiologist:  Olga Millers, MD   Chief Complaint:  FU CAD  History of Present Illness:    FUCAD. Cardiac catheterization 6/10 revealed normal LV function. There was significant coronary disease and he had PCI of his posterior lateral and right coronary artery. He also had PCI of his PDA. Note all of these were drug-eluting stents. Nuclear study November 2016 showed ejection fraction 56%. There were ECG changes but perfusion normal. Abd ultrasound 12/17 showed no aneurysm. Chest CT October 2019 shows interstitial lung disease, dilated pulmonary artery and 4 cm ascending thoracic aorta.  Since last seen,denies dyspnea, chest pain, palpitations or syncope.  The patient does not have symptoms concerning for COVID-19 infection (fever, chills, cough, or new shortness of breath).    Past Medical History:  Diagnosis Date  . CAD (coronary artery disease)   . Depression   . Diabetes mellitus (HCC)    Borderline  . Encounter for screening for malignant neoplasm of prostate   . HTN  (hypertension)   . Hyperlipidemia   . Non morbid obesity due to excess calories   . Renal insufficiency   . Rhinitis, allergic    Past Surgical History:  Procedure Laterality Date  . APPENDECTOMY    . COLONOSCOPY WITH ESOPHAGOGASTRODUODENOSCOPY (EGD)    . LEFT HEART CATH  2006   Stent x3     Current Meds  Medication Sig  . atorvastatin (LIPITOR) 40 MG tablet Take 40 mg by mouth daily at 6 PM.   . cetirizine (ZYRTEC) 10 MG tablet Take 10 mg by mouth daily.   . clopidogrel (PLAVIX) 75 MG tablet Take 75 mg by mouth daily.  . fenofibrate 160 MG tablet Take 160 mg by mouth daily.   Marland Kitchen losartan (COZAAR) 100 MG tablet Take 1 tablet (100 mg total) by mouth daily.  . metFORMIN (GLUCOPHAGE) 1000 MG tablet Take 1,000 mg by mouth 2 (two) times daily with a meal.  . metoprolol succinate (TOPROL XL) 25 MG 24 hr tablet Take 1 tablet (25 mg total) by mouth daily.  . Omega-3 Fatty Acids (FISH OIL) 1000 MG CAPS Take 1,000 mg by mouth daily.  . ranitidine (ZANTAC) 75 MG tablet Take 75 mg by mouth 2 (two) times daily.  Marland Kitchen venlafaxine XR (EFFEXOR-XR) 75 MG 24 hr capsule Take 75 mg by mouth daily with breakfast.      Allergies:   Patient has no known allergies.   Social History   Tobacco Use  . Smoking status: Former Smoker    Packs/day: 1.00    Years: 30.00    Pack years:  30.00    Last attempt to quit: 05/16/2001    Years since quitting: 17.3  . Smokeless tobacco: Never Used  Substance Use Topics  . Alcohol use: Yes    Alcohol/week: 0.0 standard drinks    Comment: Occasional  . Drug use: No     Family Hx: The patient's family history includes Diabetes in his brother; Heart disease in his brother and father; Hypertension in his brother; Stroke in his brother and mother. There is no history of Lung disease or Rheumatologic disease.  ROS:   Please see the history of present illness.    No F/C or productive cough All other systems reviewed and are negative.   Recent Lipid Panel Lab  Results  Component Value Date/Time   CHOL 108 12/02/2009 12:00 AM   TRIG 291.0 (H) 12/02/2009 12:00 AM   HDL 20.50 (L) 12/02/2009 12:00 AM   CHOLHDL 5 12/02/2009 12:00 AM   LDLCALC  11/01/2008 05:12 AM    UNABLE TO CALCULATE IF TRIGLYCERIDE OVER 400 mg/dL        Total Cholesterol/HDL:CHD Risk Coronary Heart Disease Risk Table                     Men   Women  1/2 Average Risk   3.4   3.3  Average Risk       5.0   4.4  2 X Average Risk   9.6   7.1  3 X Average Risk  23.4   11.0        Use the calculated Patient Ratio above and the CHD Risk Table to determine the patient's CHD Risk.        ATP III CLASSIFICATION (LDL):  <100     mg/dL   Optimal  295-284100-129  mg/dL   Near or Above                    Optimal  130-159  mg/dL   Borderline  [id number]  mg/dL   High  >102>190     mg/dL   Very High   LDLDIRECT 45.5 12/02/2009 12:00 AM    Wt Readings from Last 3 Encounters:  09/13/18 212 lb (96.2 kg)  07/06/18 212 lb 9.6 oz (96.4 kg)  02/13/18 198 lb 3.2 oz (89.9 kg)     Objective:    Vital Signs:  Ht 5\' 3"  (1.6 m)   Wt 212 lb (96.2 kg)   BMI 37.55 kg/m    VITAL SIGNS:  reviewed  Normal affect. Answers questions appropriately. No acute distress Physical exam not performed (telehealth visit; coronavirus pandemic)  ASSESSMENT & PLAN:    1. Coronary artery disease-patient denies chest pain.  Plan to continue medical therapy including Plavix (patient prefers Plavix to aspirin) and statin. 2. Hypertension-continue present medications and follow. 3. Hyperlipidemia-continue statin.  Laboratories monitored by primary care. 4. Obesity-we discussed the importance of diet, exercise and weight loss. 5. Interstitial lung disease-managed by pulmonary. 6. Dilated thoracic aorta-patient will need follow-up CT October 2020.  COVID-19 Education: The importance of social distancing was discussed today.  Time:   Today, I have spent 15 minutes with the patient with telehealth technology discussing  the above problems.     Medication Adjustments/Labs and Tests Ordered: Current medicines are reviewed at length with the patient today.  Concerns regarding medicines are outlined above.   Tests Ordered: No orders of the defined types were placed in this encounter.   Medication Changes: No orders of the  defined types were placed in this encounter.   Disposition:  Follow up in 6 month(s)  Signed, Olga Millers, MD  09/13/2018 3:21 PM    [facility]

## 2018-09-12 ENCOUNTER — Telehealth: Payer: Self-pay | Admitting: Cardiology

## 2018-09-12 NOTE — Telephone Encounter (Signed)
Mychart, smartphone, pre reg complete 09/12/18 AF °

## 2018-09-13 ENCOUNTER — Telehealth (INDEPENDENT_AMBULATORY_CARE_PROVIDER_SITE_OTHER): Payer: Medicare Other | Admitting: Cardiology

## 2018-09-13 ENCOUNTER — Encounter: Payer: Self-pay | Admitting: Cardiology

## 2018-09-13 VITALS — Ht 63.0 in | Wt 212.0 lb

## 2018-09-13 DIAGNOSIS — E7800 Pure hypercholesterolemia, unspecified: Secondary | ICD-10-CM

## 2018-09-13 DIAGNOSIS — I2510 Atherosclerotic heart disease of native coronary artery without angina pectoris: Secondary | ICD-10-CM

## 2018-09-13 DIAGNOSIS — I712 Thoracic aortic aneurysm, without rupture, unspecified: Secondary | ICD-10-CM

## 2018-09-13 DIAGNOSIS — I10 Essential (primary) hypertension: Secondary | ICD-10-CM

## 2018-09-13 NOTE — Patient Instructions (Signed)

## 2018-09-17 NOTE — Telephone Encounter (Signed)
Received this message in a my chart email.  Please advise.  From: Dessa Phi Sr [place]on  Sent: 5/2/20209:22 AM EDT  To: Comer Locket. Vassie Loll, MD Subject: Non-Urgent Medical Question  I wanted let you know that my wife and son that live in the same house as me have both been tested positive for the covid19. I am not showing any signs but do you think I need to be tested.

## 2018-09-17 NOTE — Telephone Encounter (Signed)
If patient is not showing symptoms he can just self isolate and try to distance from family members, wear mask, wash hands. If he wants to be tested please advise him that we are not testing here in the office and that he needs to go to the ED at [facility] and wear mask and let them know that he has been exposed. Thanks.

## 2018-11-05 ENCOUNTER — Ambulatory Visit: Payer: Medicare Other | Admitting: Pulmonary Disease

## 2019-01-01 ENCOUNTER — Other Ambulatory Visit: Payer: Self-pay | Admitting: Pulmonary Disease

## 2019-01-07 ENCOUNTER — Other Ambulatory Visit (HOSPITAL_COMMUNITY)
Admission: RE | Admit: 2019-01-07 | Discharge: 2019-01-07 | Disposition: A | Discharge status: Home / Self Care | Payer: Medicare Other | Source: Ambulatory Visit | Attending: Pulmonary Disease | Admitting: Pulmonary Disease

## 2019-01-07 DIAGNOSIS — Z20828 Contact with and (suspected) exposure to other viral communicable diseases: Secondary | ICD-10-CM | POA: Diagnosis not present

## 2019-01-07 DIAGNOSIS — Z01812 Encounter for preprocedural laboratory examination: Secondary | ICD-10-CM | POA: Insufficient documentation

## 2019-01-07 LAB — SARS CORONAVIRUS 2 (TAT 6-24 HRS): SARS Coronavirus 2: NEGATIVE

## 2019-01-10 ENCOUNTER — Ambulatory Visit: Payer: Medicare Other | Admitting: Pulmonary Disease

## 2019-01-10 ENCOUNTER — Other Ambulatory Visit: Payer: Self-pay

## 2019-01-10 ENCOUNTER — Telehealth: Payer: Self-pay | Admitting: Pulmonary Disease

## 2019-01-10 ENCOUNTER — Encounter: Payer: Self-pay | Admitting: Pulmonary Disease

## 2019-01-10 ENCOUNTER — Ambulatory Visit (HOSPITAL_COMMUNITY)
Admission: RE | Admit: 2019-01-10 | Discharge: 2019-01-10 | Disposition: A | Discharge status: Home / Self Care | Payer: Medicare Other | Source: Ambulatory Visit | Attending: Pulmonary Disease | Admitting: Pulmonary Disease

## 2019-01-10 DIAGNOSIS — Z23 Encounter for immunization: Secondary | ICD-10-CM | POA: Diagnosis not present

## 2019-01-10 DIAGNOSIS — J849 Interstitial pulmonary disease, unspecified: Secondary | ICD-10-CM

## 2019-01-10 DIAGNOSIS — G4733 Obstructive sleep apnea (adult) (pediatric): Secondary | ICD-10-CM

## 2019-01-10 DIAGNOSIS — K219 Gastro-esophageal reflux disease without esophagitis: Secondary | ICD-10-CM | POA: Diagnosis present

## 2019-01-10 LAB — PULMONARY FUNCTION TEST
DL/VA % pred: 97 %
DL/VA: 4.08 ml/min/mmHg/L
DLCO unc % pred: 64 %
DLCO unc: 13.36 ml/min/mmHg
FEF 25-75 Post: 2.56 L/sec
FEF 25-75 Pre: 2.54 L/sec
FEF2575-%Change-Post: 1 %
FEF2575-%Pred-Post: 134 %
FEF2575-%Pred-Pre: 133 %
FEV1-%Change-Post: 4 %
FEV1-%Pred-Post: 81 %
FEV1-%Pred-Pre: 77 %
FEV1-Post: 1.99 L
FEV1-Pre: 1.9 L
FEV1FVC-%Change-Post: -1 %
FEV1FVC-%Pred-Pre: 116 %
FEV6-%Change-Post: 6 %
FEV6-%Pred-Post: 75 %
FEV6-%Pred-Pre: 70 %
FEV6-Post: 2.36 L
FEV6-Pre: 2.22 L
FEV6FVC-%Pred-Post: 107 %
FEV6FVC-%Pred-Pre: 107 %
FVC-%Change-Post: 6 %
FVC-%Pred-Post: 70 %
FVC-%Pred-Pre: 66 %
FVC-Post: 2.36 L
FVC-Pre: 2.22 L
Post FEV1/FVC ratio: 84 %
Post FEV6/FVC ratio: 100 %
Pre FEV1/FVC ratio: 86 %
Pre FEV6/FVC Ratio: 100 %
RV % pred: 83 %
RV: 1.71 L
TLC % pred: 72 %
TLC: 4.06 L

## 2019-01-10 MED ORDER — ALBUTEROL SULFATE (2.5 MG/3ML) 0.083% IN NEBU
2.5000 mg | INHALATION_SOLUTION | Freq: Once | RESPIRATORY_TRACT | Status: AC
  Administered 2019-01-10: 2.5 mg via RESPIRATORY_TRACT

## 2019-01-10 NOTE — Assessment & Plan Note (Addendum)
Lung function has dropped 5 to 10% compared  to 2019 -DLCO especially is dropped from 76% to 64% Start O FEV150 twice daily, we discussed side effect of diarrhea Check liver function tests on next visit.  Schedule high-resolution CT chest in October 2020 High-dose flu shot today

## 2019-01-10 NOTE — Telephone Encounter (Signed)
Patient filled out Ofev paperwork. I placed it in the pharmacy folder outside INjection room. Patient was also sent home with the [facility] patient assistance application.   Will route to [employer] to follow up on

## 2019-01-10 NOTE — Assessment & Plan Note (Signed)
CPAP is working well on current settings, no residuals  Weight loss encouraged, compliance with goal of at least 4-6 hrs every night is the expectation. Advised against medications with sedative side effects Cautioned against driving when sleepy - understanding that sleepiness will vary on a day to day basis

## 2019-01-10 NOTE — Progress Notes (Signed)
   Subjective:    Patient ID: Gary Keller, male    DOB: 07/19/1950, 68 y.o.   MRN: [id number]  HPI  54 yoforFU ofOSA and idiopathic pulmonary fibrosis  Chief Complaint  Patient presents with  . Follow-up    ILD, OSA   He is accompanied by his daughter [name] who is a respiratory therapist His wife and son were sick with COVID, wife was hospitalized in ICU requiring high flow oxygen.  He somehow never got sick. We reviewed PFTs today which showed drop in lung function. Reviewed prior CT scan from 2019.  He admits to being sedentary over the past few months, dyspnea is slightly worse. CPAP is working well, no problems with mask or pressure. CPAP download was reviewed which shows excellent control of events on 14 cm with no residuals and minimal leak and very good compliance.     Significant tests/ events reviewed  PFT 12/2018 >> FVC 66%, TLC 72%, DLCO 64% 02/2018 FVC 66%  1/2019ratio 88, FVC 71%, DLCO 76%  01/06/17:FVC 2.29 L (67%) FEV1 2.03 L (81%) FEV1/FVC 0.88 DLCO corrected 75%  06/24/16: FVC 2.49 L (76%) FEV1 2.21 L (91%) FEV1/FVC 0.89  negative bronchodilator response TLC 3.77 L (69%)  DLCO uncorrected 66%  6MWT 01/06/17:Walked 412 meters / Baseline Sat 99% on RA / Nadir Sat 90% on RA @ end of test   PSG(06/07/16): AHI 15.0 events/hour., nadir satn78%>>Optimal CPAP pressure 14 cm   IMAGING CT CHEST W/O 07/2016 : 1.1 CM subpleural nodule adjacent major fissure left lower lobe unchanged in size compared with imaging from 2008. Fibrotic changes again noted.   HRCT CHEST W/O 05/2016  minimal subpleural patchy groundglass. Traction bronchiectasis with areas of honeycombing and subpleural as well as central distribution of reticulation with intralobular septal thickening. There is aslight craniocaudal progression suggestive of a UIP pattern. Subcarinal and precarinal lymph nodes measuring up to 1.1 &1.2 cm in short axis. Patient does have an  1.1 CM oblong nodule within his left lower lobe that corresponds to previously noted left lower lobe nodule.   CT CHEST W/ 04/2016 :9 mm left lower lobe nodule. Diffuse interlobular septal thickening with architectural distortion inferiorly with honeycombing versus bronchiectasis and medial right lower lobe. Fatty infiltration of the liver noted.  CT CHEST W/ 07/2007:. Irregular interstitial prominence in addition. Ill-defined cystic lucencies diffusely. Fracture of left 10th rib noted.  Review of Systems neg for any significant sore throat, dysphagia, itching, sneezing, nasal congestion or excess/ purulent secretions, fever, chills, sweats, unintended wt loss, pleuritic or exertional cp, hempoptysis, orthopnea pnd or change in chronic leg swelling. Also denies presyncope, palpitations, heartburn, abdominal pain, nausea, vomiting, diarrhea or change in bowel or urinary habits, dysuria,hematuria, rash, arthralgias, visual complaints, headache, numbness weakness or ataxia.     Objective:   Physical Exam   Gen. Pleasant, obese, in no distress, normal affect ENT - no pallor,icterus, no post nasal drip, class 2-3 airway Neck: No JVD, no thyromegaly, no carotid bruits Lungs: no use of accessory muscles, no dullness to percussion, bibasal rales no rhonchi  Cardiovascular: Rhythm regular, heart sounds  normal, no murmurs or gallops, no peripheral edema Abdomen: soft and non-tender, no hepatosplenomegaly, BS normal. Musculoskeletal: No deformities, no cyanosis or clubbing Neuro:  alert, non focal, no tremors        Assessment & Plan:

## 2019-01-10 NOTE — Patient Instructions (Signed)
Lung function has dropped 5 to 10% compared  to 2019 Start O FEV150 twice daily, we discussed side effect of diarrhea Check liver function tests on next visit.  Schedule high-resolution CT chest in October 2020

## 2019-01-11 NOTE — Telephone Encounter (Signed)
Will initiate Ofev BIV- Ofev 150mg  BID, ICD10- C37.628

## 2019-01-11 NOTE — Telephone Encounter (Signed)
Per Wallene Dales, will stamp and fax on Monday 01/14/2019

## 2019-01-11 NOTE — Telephone Encounter (Signed)
Thank you I will route this to Dr. Elsworth Soho as an Juluis Rainier

## 2019-01-14 NOTE — Telephone Encounter (Signed)
Received response back from insurance that Ofev already has an approval in place from 05/16/2018 through 05/16/2019.  Ran test claim and patient's copay is $2,545.22, he is in the coverage gap. Patient should proceed with patient assistance.  9:34 AM Beatriz Chancellor, CPhT

## 2019-01-14 NOTE — Telephone Encounter (Signed)
Submitted a Prior Authorization request to [facility] for Ofev 150mg  via Cover My Meds. Will update once we receive a response.  8:33 AM Beatriz Chancellor, CPhT

## 2019-01-18 NOTE — Telephone Encounter (Signed)
Called patient to discuss benefits investigation results and patient assistance. Patient advised that he mailed in his application last week Friday. Will follow up with [employer] to follow status.  Phone# [phone]  2:25 PM Beatriz Chancellor, CPhT

## 2019-01-22 ENCOUNTER — Encounter (HOSPITAL_BASED_OUTPATIENT_CLINIC_OR_DEPARTMENT_OTHER): Payer: Self-pay

## 2019-01-22 NOTE — Telephone Encounter (Signed)
Called [employer] to follow up on patient's application. Rep Clarise Cruz confirmed that it was received, however patient mailed in bank statements as proof of income and that is not a valid proof of income for their program. Patient must submit SS Statement, 1099 tax form, or a 1040 tax form. Letter was mailed to patient to advise on 01/17/19. Rep also stated that rx was not dated by the provider. Will need to be refaxed, verbal can be called in, or new rx can be escribed. Will follow up.  Phone# [phone]  11:04 AM Beatriz Chancellor, CPhT

## 2019-01-22 NOTE — Telephone Encounter (Signed)
Information refaxed to [facility] fax # [phone], waiting on fax confirmation.

## 2019-01-25 NOTE — Telephone Encounter (Signed)
New rx awaiting MD signature. PAP program requesting hard copy rx. Will follow up.

## 2019-01-28 NOTE — Telephone Encounter (Signed)
Please see telephone encounter from 01/22/2019. Patient was denied and was advised to apply for the low income subsidy. Patient was made on 01/23/2019.

## 2019-02-01 NOTE — Telephone Encounter (Signed)
Thank you for updating [place]. Please reach out if you need any other assistance.

## 2019-03-08 ENCOUNTER — Ambulatory Visit (INDEPENDENT_AMBULATORY_CARE_PROVIDER_SITE_OTHER)
Admission: RE | Admit: 2019-03-08 | Discharge: 2019-03-08 | Disposition: A | Discharge status: Home / Self Care | Payer: Medicare Other | Source: Ambulatory Visit | Attending: Pulmonary Disease | Admitting: Pulmonary Disease

## 2019-03-08 ENCOUNTER — Other Ambulatory Visit: Payer: Self-pay

## 2019-03-08 DIAGNOSIS — J849 Interstitial pulmonary disease, unspecified: Secondary | ICD-10-CM

## 2019-03-12 ENCOUNTER — Ambulatory Visit: Payer: Medicare Other | Admitting: Cardiology

## 2019-03-13 ENCOUNTER — Other Ambulatory Visit: Payer: Self-pay

## 2019-03-13 ENCOUNTER — Ambulatory Visit: Payer: Medicare Other | Admitting: Pulmonary Disease

## 2019-03-13 ENCOUNTER — Ambulatory Visit: Payer: Medicare Other | Admitting: Cardiology

## 2019-03-13 ENCOUNTER — Encounter: Payer: Self-pay | Admitting: Cardiology

## 2019-03-13 ENCOUNTER — Encounter: Payer: Self-pay | Admitting: Pulmonary Disease

## 2019-03-13 VITALS — BP 154/82 | HR 86 | Ht 62.0 in | Wt 215.0 lb

## 2019-03-13 DIAGNOSIS — I10 Essential (primary) hypertension: Secondary | ICD-10-CM | POA: Diagnosis not present

## 2019-03-13 DIAGNOSIS — G4733 Obstructive sleep apnea (adult) (pediatric): Secondary | ICD-10-CM | POA: Diagnosis not present

## 2019-03-13 DIAGNOSIS — R911 Solitary pulmonary nodule: Secondary | ICD-10-CM

## 2019-03-13 DIAGNOSIS — J849 Interstitial pulmonary disease, unspecified: Secondary | ICD-10-CM

## 2019-03-13 DIAGNOSIS — I2510 Atherosclerotic heart disease of native coronary artery without angina pectoris: Secondary | ICD-10-CM

## 2019-03-13 DIAGNOSIS — Z9861 Coronary angioplasty status: Secondary | ICD-10-CM

## 2019-03-13 DIAGNOSIS — E785 Hyperlipidemia, unspecified: Secondary | ICD-10-CM

## 2019-03-13 DIAGNOSIS — E119 Type 2 diabetes mellitus without complications: Secondary | ICD-10-CM

## 2019-03-13 DIAGNOSIS — N183 Chronic kidney disease, stage 3 unspecified: Secondary | ICD-10-CM

## 2019-03-13 DIAGNOSIS — IMO0001 Reserved for inherently not codable concepts without codable children: Secondary | ICD-10-CM

## 2019-03-13 NOTE — Assessment & Plan Note (Signed)
PCP follows- pt on Glucophage

## 2019-03-13 NOTE — Assessment & Plan Note (Signed)
Ask your cardiologist to review your CT scan -this was a nongated scan, he is already on Lipitor, metoprolol and Plavix

## 2019-03-13 NOTE — Assessment & Plan Note (Signed)
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

## 2019-03-13 NOTE — Assessment & Plan Note (Signed)
S/P RCA/PDA PCI with DES in 2010 Myoview low risk 2016-EF 55-65%

## 2019-03-13 NOTE — Assessment & Plan Note (Signed)
CPAP 14-compliant

## 2019-03-13 NOTE — Assessment & Plan Note (Signed)
happy that you are tolerating O FEV well Check LFTs again in January-or anytime before if you have blood work done

## 2019-03-13 NOTE — Patient Instructions (Addendum)
happy that you are tolerating O FEV well Check LFTs again in January-or anytime before if you have blood work done  CPAP is set at 14 cm and is working well  Ask your cardiologist to review your CT scan

## 2019-03-13 NOTE — Patient Instructions (Signed)
Medication Instructions:  Your physician recommends that you continue on your current medications as directed. Please refer to the Current Medication list given to you today. *If you need a refill on your cardiac medications before your next appointment, please call your pharmacy*  Lab Work: None  If you have labs (blood work) drawn today and your tests are completely normal, you will receive your results only by: Marland Kitchen MyChart Message (if you have MyChart) OR . A paper copy in the mail If you have any lab test that is abnormal or we need to change your treatment, we will call you to review the results.  Testing/Procedures: none  Follow-Up: At [facility], you and your health needs are our priority.  As part of our continuing mission to provide you with exceptional heart care, we have created designated Provider Care Teams.  These Care Teams include your primary Cardiologist (physician) and Advanced Practice Providers (APPs -  Physician Assistants and Nurse Practitioners) who all work together to provide you with the care you need, when you need it.  Your next appointment:   6 months  The format for your next appointment:   In Person  Provider:   Kirk Ruths, MD  Other Instructions

## 2019-03-13 NOTE — Assessment & Plan Note (Addendum)
Followed by PCP-LDL 49 June 2020

## 2019-03-13 NOTE — Progress Notes (Addendum)
Cardiology Office Note:    Date:  03/13/2019   ID:  Gary Keller, DOB 04/06/1951, MRN [id number]  PCP:  Vicenta Aly, [place]  Cardiologist:  Kirk Ruths, MD  Electrophysiologist:  None   Referring MD: Vicenta Aly, FNP   No chief complaint on file. Routine follow up  History of Present Illness:    Gary Keller is a 68 y.o. male with a hx of CAD, HTN, HLD, DM, and OSA- seen today for routine follow up.  The patient had PCI of his PLA and RCA and PDA back in 2010.  Myoview in 2016 was low risk and showed preserved LV function.  He is also followed by pulmonary for interstitial lung disease.  His PCP follows his hypertension dyslipidemia and non-insulin-dependent diabetes.  He is here for routine follow-up.  He is done well since we saw him last.  He denies any chest pain.  He told me his wife had CO VID and was hospitalized at [facility] hospital for 2 weeks.  Interestingly no one else in the family tested positive.  Fortunately she has fully recovered.  Past Medical History:  Diagnosis Date  . CAD (coronary artery disease)   . Depression   . Diabetes mellitus (HCC)    Borderline  . Encounter for screening for malignant neoplasm of prostate   . HTN (hypertension)   . Hyperlipidemia   . Non morbid obesity due to excess calories   . Renal insufficiency   . Rhinitis, allergic     Past Surgical History:  Procedure Laterality Date  . APPENDECTOMY    . COLONOSCOPY WITH ESOPHAGOGASTRODUODENOSCOPY (EGD)    . LEFT HEART CATH  2006   Stent x3    Current Medications: Current Meds  Medication Sig  . atorvastatin (LIPITOR) 40 MG tablet Take 40 mg by mouth daily at 6 PM.   . cetirizine (ZYRTEC) 10 MG tablet Take 10 mg by mouth daily.   . clopidogrel (PLAVIX) 75 MG tablet Take 75 mg by mouth daily.  . fenofibrate 160 MG tablet Take 160 mg by mouth daily.   . metFORMIN (GLUCOPHAGE) 1000 MG tablet Take 500 mg by mouth daily with breakfast.   . metoprolol succinate  (TOPROL XL) 25 MG 24 hr tablet Take 1 tablet (25 mg total) by mouth daily.  . Nintedanib (OFEV) 150 MG CAPS Take 150 mg by mouth 2 (two) times daily.  . Omega-3 Fatty Acids (FISH OIL) 1000 MG CAPS Take 1,000 mg by mouth daily.  . ranitidine (ZANTAC) 75 MG tablet Take 75 mg by mouth 2 (two) times daily.  Marland Kitchen venlafaxine XR (EFFEXOR-XR) 75 MG 24 hr capsule Take 75 mg by mouth daily with breakfast.      Allergies:   Patient has no known allergies.   Social History   Socioeconomic History  . Marital status: Single    Spouse name: Not on file  . Number of children: 3  . Years of education: Not on file  . Highest education level: Not on file  Occupational History    Comment: Retired  Scientific laboratory technician  . Financial resource strain: Not on file  . Food insecurity    Worry: Not on file    Inability: Not on file  . Transportation needs    Medical: Not on file    Non-medical: Not on file  Tobacco Use  . Smoking status: Former Smoker    Packs/day: 1.00    Years: 43.00    Pack years: 43.00  Start date: 261960    Quit date: 05/16/2001    Years since quitting: 17.8  . Smokeless tobacco: Never Used  Substance and Sexual Activity  . Alcohol use: Yes    Alcohol/week: 0.0 standard drinks    Comment: Occasional  . Drug use: No  . Sexual activity: Not on file  Lifestyle  . Physical activity    Days per week: Not on file    Minutes per session: Not on file  . Stress: Not on file  Relationships  . Social Musicianconnections    Talks on phone: Not on file    Gets together: Not on file    Attends religious service: Not on file    Active member of club or organization: Not on file    Attends meetings of clubs or organizations: Not on file    Relationship status: Not on file  Other Topics Concern  . Not on file  Social History Narrative   [facility] (05/27/16):   [place] always lived [place]. 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 Does have exposure  to chemicals from spraying. No mold exposure. Remote cockatiel exposure in a previous home. No hot tub exposure. Enjoys watching his grandchildren and hunting.      Family History: The patient's family history includes Diabetes in his brother; Heart disease in his brother and father; Hypertension in his brother; Stroke in his brother and mother. There is no history of Lung disease or Rheumatologic disease.  ROS:   Please see the history of present illness.   All other systems reviewed and are negative.  EKGs/Labs/Other Studies Reviewed:    The following studies were reviewed today: Myoview 2016  EKG:  EKG is ordered today.  The ekg ordered today demonstrates NSR, HR 86  Recent Labs: No results found for requested labs within last 8760 hours.  Recent Lipid Panel    Component Value Date/Time   CHOL 108 12/02/2009 0000   TRIG 291.0 (H) 12/02/2009 0000   HDL 20.50 (L) 12/02/2009 0000   CHOLHDL 5 12/02/2009 0000   VLDL 58.2 (H) 12/02/2009 0000   LDLCALC  11/01/2008 0512    UNABLE TO CALCULATE IF TRIGLYCERIDE OVER 400 mg/dL        Total Cholesterol/HDL:CHD Risk Coronary Heart Disease Risk Table                     Men   Women  1/2 Average Risk   3.4   3.3  Average Risk       5.0   4.4  2 X Average Risk   9.6   7.1  3 X Average Risk  23.4   11.0        Use the calculated Patient Ratio above and the CHD Risk Table to determine the patient's CHD Risk.        ATP III CLASSIFICATION (LDL):  <100     mg/dL   Optimal  [id number]  mg/dL   Near or Above                    Optimal  130-159  mg/dL   Borderline  [id number]  mg/dL   High  >478>190     mg/dL   Very High   LDLDIRECT 45.5 12/02/2009 0000    Physical Exam:    VS:  BP (!) 154/82   Pulse 86   Ht 5\' 2"  (1.575 m)   Wt  215 lb (97.5 kg)   BMI 39.32 kg/m     Wt Readings from Last 3 Encounters:  03/13/19 215 lb (97.5 kg)  03/13/19 216 lb 9.6 oz (98.2 kg)  01/10/19 213 lb (96.6 kg)     GEN:  Well nourished, well developed in  no acute distress HEENT: Normal NECK: No JVD; No carotid bruits LYMPHATICS: No lymphadenopathy CARDIAC: RRR, no murmurs, rubs, gallops RESPIRATORY:  Clear to auscultation without rales, wheezing or rhonchi  ABDOMEN: truncal obesity, Soft, non-tender, non-distended MUSCULOSKELETAL:  No edema; No deformity  SKIN: Warm and dry NEUROLOGIC:  Alert and oriented x 3 PSYCHIATRIC:  Normal affect   ASSESSMENT:    CAD S/P PCI 2010 S/P RCA/PDA PCI with DES in 2010 Myoview low risk 2016-EF 55-65%  ILD (interstitial lung disease) (HCC) 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  Consider the addition of Jardiance with the favorable data in patients with CAD.  F/U in 6 months.   Addendum:  Pt's labs are in Epic- his SCr is 1.6, GFR 42.  His last LDL was 45 in June 2020.   Medication Adjustments/Labs and Tests Ordered: Current medicines are reviewed at length with the patient today.  Concerns regarding medicines are outlined above.  Orders Placed This Encounter  Procedures  . EKG 12-Lead   No orders of the defined types were placed in this encounter.   Patient Instructions  Medication Instructions:  Your physician recommends that you continue on your current medications as directed. Please refer to the Current Medication list given to you today. *If you need a refill on your cardiac medications before your next appointment, please call your pharmacy*  Lab Work: None  If you have labs (blood work) drawn today and your tests are completely normal, you will receive your results only by: Marland Kitchen MyChart Message (if you have MyChart) OR . A paper copy in the mail If you have any lab test that is abnormal or we need to change  your treatment, we will call you to review the results.  Testing/Procedures: none  Follow-Up: At [facility], you and your health needs are our priority.  As part of our continuing mission to provide you with exceptional heart care, we have created designated Provider Care Teams.  These Care Teams include your primary Cardiologist (physician) and Advanced Practice Providers (APPs -  Physician Assistants and Nurse Practitioners) who all work together to provide you with the care you need, when you need it.  Your next appointment:   6 months  The format for your next appointment:   In Person  Provider:   Olga Millers, MD  Other Instructions     Signed, Corine Shelter, PA-C  03/13/2019 2:33 PM    [facility]

## 2019-03-13 NOTE — Progress Notes (Signed)
Subjective:    Patient ID: Gary Keller, male    DOB: 09/06/1950, 68 y.o.   MRN: [id number]  HPI  57 yoforFU ofOSA and idiopathic pulmonary fibrosis  Due to mild increase in dyspnea and drop in lung function compared to 2019, he was started on O FEV on his last visit.  He is tolerating this well, has occasional diarrhea every other day but has not needed Lomotil.  Breathing is stable.  No pedal edema.  We reviewed HRCT today which shows mild progression compared to 2019 and air trapping so does raise the question of hypersensitivity pneumonitis but overall is consistent with UIP Reviewed blood work done from [facility] shows normal LFTs 10/2018, 12/2018 and again 03/07/2019.  Slight rise in alk phos and ALT rising from 30-40  He is also on Lipitor.  We reviewed other findings on HRCT including 4 cm dilated aorta and calcium and coronaries.  CPAP is working well and he denies any problems with mask or pressure.  Download was reviewed which shows excellent control of events on 14 cm and great compliance    Significant tests/ events reviewed  PFT 12/2018 >> FVC 66%, TLC 72%, DLCO 64% 02/2018 FVC 66%  1/2019ratio 88, FVC 71%, DLCO 76%  01/06/17:FVC 2.29 L (67%) FEV1 2.03 L (81%) FEV1/FVC 0.88 DLCO corrected 75%  06/24/16: FVC 2.49 L (76%) FEV1 2.21 L (91%) FEV1/FVC 0.89 negative bronchodilator response TLC 3.77 L (69%) DLCO uncorrected 66%  6MWT 01/06/17:Walked 412 meters / Baseline Sat 99% on RA / Nadir Sat 90% on RA @ end of test   PSG(06/07/16): AHI 15.0 events/hour., nadir satn78%>>Optimal CPAP pressure 14 cm    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 Fibrotic changes again noted.   HRCT CHEST W/O 05/2016  minimal subpleural patchy groundglass. Traction  bronchiectasis with areas of honeycombing and subpleural as well as central distribution of reticulation with intralobular septal thickening. There is aslight craniocaudal progression suggestive of a UIP pattern. Subcarinal and precarinal lymph nodes measuring up to 1.1 &1.2 cm in short axis. Patient does have an 1.1 CM oblong nodule within his left lower lobe that corresponds to previously noted left lower lobe nodule.   CT CHEST W/ 04/2016 :9 mm left lower lobe nodule. Diffuse interlobular septal thickening with architectural distortion inferiorly with honeycombing versus bronchiectasis and medial right lower lobe. Fatty infiltration of the liver noted.  CT CHEST W/ 07/2007:. Irregular interstitial prominence in addition. Ill-defined cystic lucencies diffusely. Fracture of left 10th rib noted.  Past Medical History:  Diagnosis Date  . CAD (coronary artery disease)   . Depression   . Diabetes mellitus (HCC)    Borderline  . Encounter for screening for malignant neoplasm of prostate   . HTN (hypertension)   . Hyperlipidemia   . Non morbid obesity due to excess calories   . Renal insufficiency   . Rhinitis, allergic      Review of Systems neg for any significant sore throat, dysphagia, itching, sneezing, nasal congestion or excess/ purulent secretions, fever, chills, sweats, unintended wt loss, pleuritic or exertional cp, hempoptysis, orthopnea pnd or change in chronic leg swelling. Also denies presyncope, palpitations, heartburn, abdominal pain, nausea, vomiting, diarrhea or change in bowel or urinary habits, dysuria,hematuria, rash, arthralgias, visual complaints, headache, numbness weakness or ataxia.  Objective:   Physical Exam   Gen. Pleasant, obese, in no distress, normal affect ENT - no pallor,icterus, no post nasal drip, class 2-3 airway Neck: No JVD, no thyromegaly, no carotid bruits Lungs: no use of accessory muscles, no dullness to percussion, bibasal rales no  rhonchi  Cardiovascular: Rhythm regular, heart sounds  normal, no murmurs or gallops, no peripheral edema Abdomen: soft and non-tender, no hepatosplenomegaly, BS normal. Musculoskeletal: No deformities, no cyanosis or clubbing Neuro:  alert, non focal, no tremors        Assessment & Plan:

## 2019-03-13 NOTE — Assessment & Plan Note (Signed)
Controlled.  

## 2019-03-13 NOTE — Assessment & Plan Note (Signed)
CPAP is set at 14 cm and is working well  Weight loss encouraged, compliance with goal of at least 4-6 hrs every night is the expectation. Advised against medications with sedative side effects Cautioned against driving when sleepy - understanding that sleepiness will vary on a day to day basis

## 2019-03-13 NOTE — Assessment & Plan Note (Signed)
Stable on CT

## 2019-05-23 ENCOUNTER — Telehealth: Payer: Self-pay | Admitting: Pharmacy Technician

## 2019-05-23 NOTE — Telephone Encounter (Signed)
Received notification from [facility] regarding a prior authorization for OFEV 150mg . Authorization has been APPROVED from 05/23/19 to 05/15/20.   Will send document to scan center.  Authorization # 05/17/20 Phone # [phone]  9:59 AM [phone], CPhT

## 2019-06-03 ENCOUNTER — Telehealth: Payer: Self-pay | Admitting: Pharmacy Technician

## 2019-06-03 NOTE — Telephone Encounter (Signed)
Received a fax from  [facility] regarding an approval for OFEV from 06/03/19 to 05/15/20.   Phone number: [phone]  Will send document to scan center.

## 2019-06-05 NOTE — Telephone Encounter (Signed)
Patient was approved for [employer] Ofev PAP through 2021.    Closing encounter.   Verlin Fester, PharmD, [place], CPP Clinical Specialty Pharmacist [phone]  06/05/2019 8:31 AM

## 2019-06-13 ENCOUNTER — Other Ambulatory Visit: Payer: Self-pay

## 2019-06-13 ENCOUNTER — Ambulatory Visit (INDEPENDENT_AMBULATORY_CARE_PROVIDER_SITE_OTHER): Payer: Medicare Other | Admitting: Pulmonary Disease

## 2019-06-13 ENCOUNTER — Encounter: Payer: Self-pay | Admitting: Pulmonary Disease

## 2019-06-13 VITALS — BP 152/90 | HR 95 | Temp 97.7°F | Ht 63.0 in | Wt 216.2 lb

## 2019-06-13 DIAGNOSIS — J849 Interstitial pulmonary disease, unspecified: Secondary | ICD-10-CM

## 2019-06-13 DIAGNOSIS — I10 Essential (primary) hypertension: Secondary | ICD-10-CM | POA: Diagnosis not present

## 2019-06-13 DIAGNOSIS — G4733 Obstructive sleep apnea (adult) (pediatric): Secondary | ICD-10-CM | POA: Diagnosis not present

## 2019-06-13 DIAGNOSIS — Z5181 Encounter for therapeutic drug level monitoring: Secondary | ICD-10-CM

## 2019-06-13 LAB — HEPATIC FUNCTION PANEL
ALT: 31 U/L (ref 0–53)
AST: 24 U/L (ref 0–37)
Albumin: 4.4 g/dL (ref 3.5–5.2)
Alkaline Phosphatase: 85 U/L (ref 39–117)
Bilirubin, Direct: 0.1 mg/dL (ref 0.0–0.3)
Total Bilirubin: 0.5 mg/dL (ref 0.2–1.2)
Total Protein: 7.5 g/dL (ref 6.0–8.3)

## 2019-06-13 NOTE — Assessment & Plan Note (Signed)
Appears stable by history We will obtain LFTs for drug monitoring since he is also on Lipitor  Schedule PFTs next visit in 3 months to see if lung function has stabilized on O FEV

## 2019-06-13 NOTE — Assessment & Plan Note (Signed)
Blood pressure slight high, he may have to check at home and if remains high then can increase metoprolol to 50 mg. History obtained independently from daughter and recommendation given

## 2019-06-13 NOTE — Patient Instructions (Signed)
Check LFTs today  PFTs next visit

## 2019-06-13 NOTE — Assessment & Plan Note (Signed)
CPAP download report reviewed which shows good control of events on 14 cm, minimal leak and excellent compliance.  Weight loss encouraged, compliance with goal of at least 4-6 hrs every night is the expectation. Advised against medications with sedative side effects Cautioned against driving when sleepy - understanding that sleepiness will vary on a day to day basis

## 2019-06-13 NOTE — Progress Notes (Signed)
   Subjective:    Patient ID: Gary Keller, male    DOB: 11/08/1950, 69 y.o.   MRN: [id number]  HPI 68yoforFU ofOSA and idiopathic pulmonary fibrosis  Due to mild increase in dyspnea and drop in lung function compared to 2019, he was started on  OFEV 12/2018  Breathing is stable, he denies wheezing, cough is improved He has not obtained Covid vaccine yet, is on the list. Diarrhea has improved, now only 1-2 bowel movements per day. His O FEV was approved for another year  Reviewed pharmacy records for O FEV  Called daughter and obtain independent history    Significant tests/ events reviewed  PFT 12/2018 >>FVC 66%, TLC 72%, DLCO 64% 02/2018 FVC 66%  1/2019ratio 88, FVC 71%, DLCO 76%  01/06/17:FVC 2.29 L (67%) FEV1 2.03 L (81%) FEV1/FVC 0.88 DLCO corrected 75%  06/24/16: FVC 2.49 L (76%) FEV1 2.21 L (91%) FEV1/FVC 0.89 negative bronchodilator response TLC 3.77 L (69%) DLCO uncorrected 66%  01/06/17:Walked 412 meters / Baseline Sat 99% on RA / Nadir Sat 90% on RA @ end of test   PSG(06/07/16): AHI 15.0 events/hour., nadir satn78%>>Optimal CPAP pressure 14 cm    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 Fibrotic changes again noted.   HRCT CHEST W/O 05/2016  minimal subpleural patchy groundglass. Traction bronchiectasis with areas of honeycombing and subpleural as well as central distribution of reticulation with intralobular septal thickening. There is aslight craniocaudal progression suggestive of a UIP pattern. Subcarinal and precarinal lymph nodes measuring up to 1.1 &1.2 cm in short axis. Patient does have an 1.1 CM oblong nodule within his left lower lobe that corresponds to previously noted left lower lobe nodule.   CT CHEST W/ 04/2016 :9  mm left lower lobe nodule. Diffuse interlobular septal thickening with architectural distortion inferiorly with honeycombing versus bronchiectasis and medial right lower lobe. Fatty infiltration of the liver noted.  CT CHEST W/ 07/2007:. Irregular interstitial prominence in addition. Ill-defined cystic lucencies diffusely. Fracture of left 10th rib noted.  Review of Systems neg for any significant sore throat, dysphagia, itching, sneezing, nasal congestion or excess/ purulent secretions, fever, chills, sweats, unintended wt loss, pleuritic or exertional cp, hempoptysis, orthopnea pnd or change in chronic leg swelling. Also denies presyncope, palpitations, heartburn, abdominal pain, nausea, vomiting, diarrhea or change in bowel or urinary habits, dysuria,hematuria, rash, arthralgias, visual complaints, headache, numbness weakness or ataxia.     Objective:   Physical Exam  Gen. Pleasant, obese, in no distress ENT - no lesions, no post nasal drip Neck: No JVD, no thyromegaly, no carotid bruits Lungs: no use of accessory muscles, no dullness to percussion, bibasal rales no rhonchi  Cardiovascular: Rhythm regular, heart sounds  normal, no murmurs or gallops, no peripheral edema Musculoskeletal: No deformities, no cyanosis or clubbing , no tremors        Assessment & Plan:

## 2019-06-24 ENCOUNTER — Ambulatory Visit: Payer: Medicare Other | Attending: Internal Medicine

## 2019-06-24 DIAGNOSIS — Z23 Encounter for immunization: Secondary | ICD-10-CM

## 2019-06-24 NOTE — Progress Notes (Signed)
   Covid-19 Vaccination Clinic  Name:  Gary Keller    MRN: [id number] DOB: 05/07/1951  06/24/2019  Mr. Gary Keller was observed post Covid-19 immunization for 15 minutes without incidence. He was provided with Vaccine Information Sheet and instruction to access the V-Safe system.   Mr. Gary Keller was instructed to call 911 with any severe reactions post vaccine: Marland Kitchen Difficulty breathing  . Swelling of your face and throat  . A fast heartbeat  . A bad rash all over your body  . Dizziness and weakness    Immunizations Administered    Name Date Dose VIS Date Route   Pfizer COVID-19 Vaccine 06/24/2019  5:35 PM 0.3 mL 04/26/2019 Intramuscular   Manufacturer: [employer], [employer]   Lot: [id number]   NDC: [address]-6

## 2019-07-20 ENCOUNTER — Ambulatory Visit: Payer: Medicare Other | Attending: Internal Medicine

## 2019-07-20 DIAGNOSIS — Z23 Encounter for immunization: Secondary | ICD-10-CM | POA: Insufficient documentation

## 2019-07-20 NOTE — Progress Notes (Signed)
   Covid-19 Vaccination Clinic  Name:  Gary Keller    MRN: [id number] DOB: 1951-01-28  07/20/2019  [name] was observed post Covid-19 immunization for 15 minutes without incident. He was provided with Vaccine Information Sheet and instruction to access the V-Safe system.   [name] was instructed to call 911 with any severe reactions post vaccine: Marland Kitchen Difficulty breathing  . Swelling of face and throat  . A fast heartbeat  . A bad rash all over body  . Dizziness and weakness   Immunizations Administered    Name Date Dose VIS Date Route   Pfizer COVID-19 Vaccine 07/20/2019  9:46 AM 0.3 mL 04/26/2019 Intramuscular   Manufacturer: [employer], [employer]   Lot: [id number]   NDC: [address]-9

## 2019-09-07 ENCOUNTER — Other Ambulatory Visit (HOSPITAL_COMMUNITY)
Admission: RE | Admit: 2019-09-07 | Discharge: 2019-09-07 | Disposition: A | Discharge status: Home / Self Care | Payer: Medicare Other | Source: Ambulatory Visit | Attending: Pulmonary Disease | Admitting: Pulmonary Disease

## 2019-09-07 DIAGNOSIS — Z20822 Contact with and (suspected) exposure to covid-19: Secondary | ICD-10-CM | POA: Insufficient documentation

## 2019-09-07 DIAGNOSIS — Z01812 Encounter for preprocedural laboratory examination: Secondary | ICD-10-CM | POA: Insufficient documentation

## 2019-09-08 LAB — SARS CORONAVIRUS 2 (TAT 6-24 HRS): SARS Coronavirus 2: NEGATIVE

## 2019-09-10 ENCOUNTER — Telehealth (INDEPENDENT_AMBULATORY_CARE_PROVIDER_SITE_OTHER): Payer: Medicare Other | Admitting: Adult Health

## 2019-09-10 ENCOUNTER — Encounter: Payer: Self-pay | Admitting: Adult Health

## 2019-09-10 VITALS — BP 147/73 | HR 81 | Ht 63.0 in | Wt 215.0 lb

## 2019-09-10 DIAGNOSIS — I10 Essential (primary) hypertension: Secondary | ICD-10-CM | POA: Diagnosis not present

## 2019-09-10 DIAGNOSIS — Z87891 Personal history of nicotine dependence: Secondary | ICD-10-CM

## 2019-09-10 DIAGNOSIS — E119 Type 2 diabetes mellitus without complications: Secondary | ICD-10-CM

## 2019-09-10 DIAGNOSIS — E7800 Pure hypercholesterolemia, unspecified: Secondary | ICD-10-CM

## 2019-09-10 DIAGNOSIS — Z955 Presence of coronary angioplasty implant and graft: Secondary | ICD-10-CM

## 2019-09-10 DIAGNOSIS — I2510 Atherosclerotic heart disease of native coronary artery without angina pectoris: Secondary | ICD-10-CM | POA: Diagnosis not present

## 2019-09-10 DIAGNOSIS — J849 Interstitial pulmonary disease, unspecified: Secondary | ICD-10-CM

## 2019-09-10 NOTE — Progress Notes (Signed)
Virtual Visit via Telephone Note   This visit type was conducted due to national recommendations for restrictions regarding the COVID-19 Pandemic (e.g. social distancing) in an effort to limit this patient's exposure and mitigate transmission in our community.  Due to his co-morbid illnesses, this patient is at least at moderate risk for complications without adequate follow up.  This format is felt to be most appropriate for this patient at this time.  The patient did not have access to video technology/had technical difficulties with video requiring transitioning to audio format only (telephone).  All issues noted in this document were discussed and addressed.  No physical exam could be performed with this format.  Please refer to the patient's chart for his  consent to telehealth for [facility].   Date:  09/10/2019   ID:  Gary Keller, DOB 09/15/1950, MRN [id number]  Patient Location: Home Provider Location: Home  PCP:  Elizabeth Palau, FNP  Cardiologist:  Olga Millers, MD  Electrophysiologist:  None   Evaluation Performed:  Follow-Up Visit  History of Present Illness:    Gary Keller is a 69 y.o. male we are following for CAD, HTN, HL, DM and OSA. Hx of PCI of his PLA, RCA, and PDA in 2010. He is followed by pulmonologist for interstitial lung disease. PCP manages HTN, NIDDM and HL. He is also being treated for OSA on CPAP.  Last seen in the office by Corine Shelter, PA on 03/13/2019 and was clinically stable from CV standpoint. Recommendations for weight loss, and Jardiance for CV protection with diabetes.  He offers no complaints from CV standpoint. His labs are followed by his PCP and by Dr. Vassie Loll. He is medically complaint and is also compliant with CPAP.  His BP is a little elevated today, but he has just taken his medications. He reports that it is normally in the 130' over 70's.   He denies chest pain, DOE, weakness, fatigue, palpitations or dizziness.   The  patient does not have symptoms concerning for COVID-19 infection (fever, chills, cough, or new shortness of breath).    Past Medical History:  Diagnosis Date  . CAD (coronary artery disease)   . Depression   . Diabetes mellitus (HCC)    Borderline  . Encounter for screening for malignant neoplasm of prostate   . HTN (hypertension)   . Hyperlipidemia   . Non morbid obesity due to excess calories   . Renal insufficiency   . Rhinitis, allergic    Past Surgical History:  Procedure Laterality Date  . APPENDECTOMY    . COLONOSCOPY WITH ESOPHAGOGASTRODUODENOSCOPY (EGD)    . LEFT HEART CATH  2006   Stent x3     Current Meds  Medication Sig  . atorvastatin (LIPITOR) 40 MG tablet Take 40 mg by mouth daily at 6 PM.   . cetirizine (ZYRTEC) 10 MG tablet Take 10 mg by mouth daily.   . clopidogrel (PLAVIX) 75 MG tablet Take 75 mg by mouth daily.  . fenofibrate 160 MG tablet Take 160 mg by mouth daily.   Marland Kitchen losartan (COZAAR) 100 MG tablet Take 50 mg by mouth daily.  . metFORMIN (GLUCOPHAGE) 1000 MG tablet Take 500 mg by mouth daily with breakfast.   . metoprolol succinate (TOPROL XL) 25 MG 24 hr tablet Take 1 tablet (25 mg total) by mouth daily.  . Nintedanib (OFEV) 150 MG CAPS Take 150 mg by mouth 2 (two) times daily.  . Omega-3 Fatty Acids (FISH OIL)  1000 MG CAPS Take 1,000 mg by mouth daily.  . ranitidine (ZANTAC) 75 MG tablet Take 75 mg by mouth 2 (two) times daily.  Marland Kitchen venlafaxine XR (EFFEXOR-XR) 75 MG 24 hr capsule Take 75 mg by mouth daily with breakfast.      Allergies:   Patient has no known allergies.   Social History   Tobacco Use  . Smoking status: Former Smoker    Packs/day: 1.00    Years: 43.00    Pack years: 43.00    Start date: 29    Quit date: 05/16/2001    Years since quitting: 18.3  . Smokeless tobacco: Never Used  Substance Use Topics  . Alcohol use: Yes    Alcohol/week: 0.0 standard drinks    Comment: Occasional  . Drug use: No     Family Hx: The  patient's family history includes Diabetes in his brother; Heart disease in his brother and father; Hypertension in his brother; Stroke in his brother and mother. There is no history of Lung disease or Rheumatologic disease.  ROS:   Please see the history of present illness.    All other systems reviewed and are negative.   Prior CV studies:   The following studies were reviewed today:   Labs/Other Tests and Data Reviewed:    EKG:  No ECG reviewed.  Recent Labs: 06/13/2019: ALT 31   Recent Lipid Panel Lab Results  Component Value Date/Time   CHOL 108 12/02/2009 12:00 AM   TRIG 291.0 (H) 12/02/2009 12:00 AM   HDL 20.50 (L) 12/02/2009 12:00 AM   CHOLHDL 5 12/02/2009 12:00 AM   LDLCALC  11/01/2008 05:12 AM    UNABLE TO CALCULATE IF TRIGLYCERIDE OVER 400 mg/dL        Total Cholesterol/HDL:CHD Risk Coronary Heart Disease Risk Table                     Men   Women  1/2 Average Risk   3.4   3.3  Average Risk       5.0   4.4  2 X Average Risk   9.6   7.1  3 X Average Risk  23.4   11.0        Use the calculated Patient Ratio above and the CHD Risk Table to determine the patient's CHD Risk.        ATP III CLASSIFICATION (LDL):  <100     mg/dL   Optimal  100-129  mg/dL   Near or Above                    Optimal  130-159  mg/dL   Borderline  160-189  mg/dL   High  >190     mg/dL   Very High   LDLDIRECT 45.5 12/02/2009 12:00 AM    Wt Readings from Last 3 Encounters:  09/10/19 215 lb (97.5 kg)  06/13/19 216 lb 3.2 oz (98.1 kg)  03/13/19 215 lb (97.5 kg)     Objective:    Vital Signs:  BP (!) 147/73   Pulse 81   Ht 5\' 3"  (1.6 m)   Wt 215 lb (97.5 kg)   BMI 38.09 kg/m    VITAL SIGNS:  reviewed GEN:  no acute distress RESPIRATORY:  normal respiratory effort, symmetric expansion NEURO:  alert and oriented x 3, no obvious focal deficit PSYCH:  normal affect  ASSESSMENT & PLAN:    1.CAD: No cardiac complaints. Medically compliant.  With multiple risk  factors may  need to consider follow up NM study to evaluate for any progression of CAD. Will defer to Dr. Jens Som. No changes in his medications at this time.   2. Hypertension: Today it is not optimal for CAD patient with diabetes. He states that it is normally lower. Will need to follow this and titrate medications if necessary. PCP is following labs.   3. NIDDM: Followed by PCP  4. Interstitial Lung Disease: Followed by Dr. Vassie Loll.   COVID-19 Education: The signs and symptoms of COVID-19 were discussed with the patient and how to seek care for testing (follow up with PCP or arrange E-visit). The importance of social distancing was discussed today.  Time:   Today, I have spent 15 minutes with the patient with telehealth technology discussing the above problems.     Medication Adjustments/Labs and Tests Ordered: Current medicines are reviewed at length with the patient today.  Concerns regarding medicines are outlined above.   Tests Ordered: No orders of the defined types were placed in this encounter.   Medication Changes: No orders of the defined types were placed in this encounter.   Disposition:  Follow up 6 months   Signed, Bettey Mare. Liborio Nixon, ANP, AACC  09/10/2019 9:26 AM    [facility]

## 2019-09-10 NOTE — Progress Notes (Signed)
@Patient  ID: Gary Keller, male    DOB: 05-07-1951, 69 y.o.   MRN: [id number]  Chief Complaint  Patient presents with  . Follow-up    pt is here for a PFT to get schedule for that.    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 Elsworth Soho  09/11/2019  - Visit   69 year old male former smoker followed in our office for obstructive sleep apnea and IPF.  Currently managed on Ofev.  He was started on Ofev in August/2020.  Last seen in our office in January/2021.  At that time he was felt to have stability with his breathing.  Diarrhea had also improved now only having 1-2 bowel movements a day.  Patient presenting to office today for pulmonary function testing.   Pulmonary function test are listed below:  09/11/2019-pulmonary function test-FVC 2.08 (65% predicted), postbronchodilator ratio 85, postbronchodilator FEV1 1.88 (81% predicted), no bronchodilator response, mid flow reversibility, DLCO 13.44 (67% predicted)  Patient was walked in office today had brief moments of oxygen levels dropping to 88%.  Recovered quickly after stopping.  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 Fibrotic changes again noted.   HRCT CHEST W/O 05/2016  minimal subpleural patchy groundglass. Traction bronchiectasis with areas of honeycombing and subpleural as well as central distribution of reticulation with intralobular septal thickening. There is aslight craniocaudal progression suggestive of a UIP pattern. Subcarinal and precarinal lymph nodes measuring up to 1.1 &1.2 cm in short axis. Patient does have an 1.1 CM oblong nodule within his left lower lobe that corresponds to previously noted left lower lobe nodule.   CT CHEST W/ 04/2016 :9 mm left lower lobe nodule. Diffuse interlobular septal thickening with architectural distortion inferiorly with honeycombing versus bronchiectasis and medial right lower lobe. Fatty infiltration of the liver noted.  CT CHEST W/ 07/2007:. Irregular interstitial prominence in addition. Ill-defined cystic lucencies diffusely. Fracture of left 10th rib noted.   FENO:  No results found for: NITRICOXIDE  PFT: PFT Results Latest Ref Rng & Units 09/11/2019 01/10/2019 06/13/2017 01/06/2017 09/29/2016 06/24/2016  FVC-Pre L 2.08 2.22 2.41 2.29 2.31 2.49  FVC-Predicted Pre % 65 66 71 67 67 76  FVC-Post L 2.21 2.36 - - - 2.46  FVC-Predicted Post % 69 70 - - - 75  Pre FEV1/FVC % % 86 86 88 88 87 89  Post FEV1/FCV % % 85 84 - - - 88  FEV1-Pre L 1.79 1.90 2.13 2.03 2.00 2.21  FEV1-Predicted Pre %  77 77 85 81 79 91  FEV1-Post L 1.88 1.99 - - - 2.16  DLCO UNC% % 67 64 76 75 69 66  DLCO COR %Predicted % 97 97 108 119 104 105  TLC L 3.48 4.06 - - - 3.77  TLC % Predicted % 64 72 - - - 69  RV % Predicted % 46 83 - - - 53    WALK:  SIX MIN WALK 09/11/2019 07/06/2018 01/06/2017 09/29/2016 06/24/2016  Medications - - pt stated that he took all of his meds this  morning around 7 am lipitor 6m, xyrtec 153m plavix 7584mfenofibrate 160m79mozarr 100mg33mprol-xl 25mg,59mtac 75mg &76mexor-xr 75 all taken at 7:15am Lipitor 40mg, Z35mc 10mg, Pl69m 75mg, Fen80mrate 160mg, Coza21m00mg, Topro29m 25mg, Zantac29mg and Effe37mXR 75mg ---ALL at65mam  Supplimental Oxygen during Test? (L/min) - No No No No  Laps - - 8 7 9   Partial Lap (in Meters) - - 28 18 20   Baseline BP (sitting) - - 136/80 136/74 128/70  Baseline Heartrate - - 99 74 113  Baseline Dyspnea (Borg Scale) - - 0 0 0  Baseline Fatigue (Borg Scale) - - 0 0 0  Baseline SPO2 - - 99 99 97  BP (sitting) - - 174/92 150/80 164/70  Heartrate - - 141 121 146  Dyspnea (Borg Scale) - - 0.5 0 1  Fatigue (Borg Scale) - - 2 0 1  SPO2 - - 90 91 92  BP (sitting) - - 144/82 146/78 152/76  Heartrate - - 103 91 125  SPO2 - - 100 99 100  Stopped or Paused before Six Minutes - - No No No  Distance Completed - - 412 354 452  Tech Comments: pt walked steady pace O2 dropped 500 yards of walking\\sb moderate walking pace - completed with mild SOB at end of walk.  SpO2 remained 90% and above.  [name] Joella Princehat he did have some sligh chest pressure but that is normal for him.  completed test without difficulty test preformed with forehead probe. pt completed test with no desats or complaints.  pt walked a fast pace during the walk test, no desat. Pt c/o leg tightness at the end of the test - no pain    Imaging: No results found.  Lab Results:  CBC    Component Value Date/Time   WBC 5.3 11/04/2008 0605   RBC 4.33 11/04/2008 0605   HGB 13.8 11/04/2008 0605   HCT 38.4 (L) 11/04/2008 0605   PLT 182 11/04/2008 0605   MCV 88.7 11/04/2008 0605   MCHC 36.0 11/04/2008 0605   RDW 12.5 11/04/2008 0605   LYMPHSABS 2.4 10/31/2008 1547   MONOABS 0.5 10/31/2008 1547   EOSABS 0.1 10/31/2008 1547   BASOSABS 0.0 10/31/2008 1547    BMET    Component Value Date/Time   NA 142 12/02/2009 0000   K  4.2 12/02/2009 0000   CL 108 12/02/2009 0000   CO2 33 (H) 12/02/2009 0000   GLUCOSE 88 12/02/2009 0000   BUN 27 (H) 12/02/2009 0000   CREATININE 1.2 12/02/2009 0000   CALCIUM 8.8 12/02/2009 0000   GFRNONAA 66.19 12/02/2009 0000   GFRAA  11/04/2008 0605    >60        The eGFR has been calculated using the MDRD equation. This calculation has not been validated in all clinical situations. eGFR's persistently <60 mL/min signify possible Chronic Kidney Disease.    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 Fluad Quad(high Dose 65+) 01/10/2019  . Influenza, High Dose Seasonal PF 02/13/2018  . Influenza,inj,Quad PF,6+ Mos 05/27/2016, 01/06/2017  . PFIZER SARS-COV-2 Vaccination 06/24/2019, 07/20/2019  . Pneumococcal Polysaccharide-23 06/24/2016    Past Medical History:  Diagnosis Date  . CAD (coronary artery disease)   . Depression   . Diabetes mellitus (HCC)    Borderline  . Encounter for screening for malignant neoplasm of prostate   . HTN (hypertension)   . Hyperlipidemia   . Non morbid obesity due to excess calories   . Renal insufficiency   . Rhinitis, allergic     Tobacco History: Social History   Tobacco Use  Smoking Status Former Smoker  . Packs/day: 1.00  . Years: 43.00  . Pack years: 43.00  . Start date: 14 . Quit date: 05/16/2001  . Years since quitting: 18.3  Smokeless Tobacco Never Used   Counseling given: Yes  Continue to not smoke  Outpatient Encounter Medications as of 09/11/2019  Medication Sig  . atorvastatin (LIPITOR) 40 MG tablet Take 40 mg by mouth daily at 6 PM.     . cetirizine (ZYRTEC) 10 MG tablet Take 10 mg by mouth daily.   . clopidogrel (PLAVIX) 75 MG tablet Take 75 mg by mouth daily.  . fenofibrate 160 MG tablet Take 160 mg by mouth daily.   .Marland Kitchenlosartan (COZAAR) 100 MG tablet Take 50 mg by mouth daily.  . metFORMIN (GLUCOPHAGE) 1000 MG tablet Take 500 mg by mouth daily with breakfast.   . metoprolol succinate (TOPROL XL) 25 MG 24 hr tablet Take 1 tablet (25 mg total) by mouth daily.  . Nintedanib (OFEV) 150 MG CAPS Take 150 mg by mouth 2 (two) times daily.  . Omega-3 Fatty Acids (FISH OIL) 1000 MG CAPS Take 1,000 mg by mouth daily.  . ranitidine (ZANTAC) 75 MG tablet Take 75 mg by mouth 2 (two) times daily.  .Marland Kitchenvenlafaxine XR (EFFEXOR-XR) 75 MG 24 hr capsule Take 75 mg by mouth daily with breakfast.    No facility-administered encounter medications on file as of 09/11/2019.     Review of Systems  Review of Systems  Constitutional: Negative for activity change, chills, fatigue, fever and unexpected weight change.  HENT: Negative for postnasal drip, rhinorrhea, sinus pressure, sinus pain and sore throat.   Eyes: Negative.   Respiratory: Negative for cough, shortness of breath and wheezing.   Cardiovascular: Negative for chest pain and palpitations.  Gastrointestinal: Negative for constipation, diarrhea, nausea and vomiting.  Endocrine: Negative.   Genitourinary: Negative.   Musculoskeletal: Negative.   Skin: Negative.   Neurological: Negative for dizziness and headaches.  Psychiatric/Behavioral: Negative.  Negative for dysphoric mood. The patient is not nervous/anxious.   All other systems reviewed and are negative.    Physical Exam  BP (!) 158/106 (BP Location: Right Arm, Cuff Size: Normal)   Pulse (!) 58   Temp 98.3 F (36.8 C)   Ht 5' 2"  (1.575 m)  Wt 210 lb 12.8 oz (95.6 kg)   SpO2 93%   BMI 38.56 kg/m   Wt Readings from Last 5 Encounters:  09/11/19 210 lb 12.8 oz (95.6 kg)  09/10/19 215 lb (97.5 kg)  06/13/19 216 lb 3.2  oz (98.1 kg)  03/13/19 215 lb (97.5 kg)  03/13/19 216 lb 9.6 oz (98.2 kg)    BMI Readings from Last 5 Encounters:  09/11/19 38.56 kg/m  09/10/19 38.09 kg/m  06/13/19 38.30 kg/m  03/13/19 39.32 kg/m  03/13/19 39.62 kg/m     Physical Exam Vitals and nursing note reviewed.  Constitutional:      General: He is not in acute distress.    Appearance: Normal appearance. He is obese.  HENT:     Head: Normocephalic and atraumatic.     Right Ear: Hearing and external ear normal.     Left Ear: Hearing and external ear normal.     Nose: Nose normal. No mucosal edema or rhinorrhea.     Right Turbinates: Not enlarged.     Left Turbinates: Not enlarged.     Mouth/Throat:     Mouth: Mucous membranes are dry.     Pharynx: Oropharynx is clear. No oropharyngeal exudate.  Eyes:     Pupils: Pupils are equal, round, and reactive to light.  Cardiovascular:     Rate and Rhythm: Normal rate and regular rhythm.     Pulses: Normal pulses.     Heart sounds: Normal heart sounds. No murmur.  Pulmonary:     Effort: Pulmonary effort is normal.     Breath sounds: Rales (bibasilar R> L ) present. No decreased breath sounds or wheezing.  Abdominal:     General: Bowel sounds are normal. There is no distension.     Palpations: Abdomen is soft.     Tenderness: There is no abdominal tenderness.  Musculoskeletal:     Cervical back: Normal range of motion.     Right lower leg: No edema.     Left lower leg: No edema.  Lymphadenopathy:     Cervical: No cervical adenopathy.  Skin:    General: Skin is warm and dry.     Capillary Refill: Capillary refill takes less than 2 seconds.     Findings: No erythema or rash.  Neurological:     General: No focal deficit present.     Mental Status: He is alert and oriented to person, place, and time.     Motor: No weakness.     Coordination: Coordination normal.     Gait: Gait is intact. Gait normal.  Psychiatric:        Mood and Affect: Mood normal.         Behavior: Behavior normal. Behavior is cooperative.        Thought Content: Thought content normal.        Judgment: Judgment normal.       Assessment & Plan:   ILD (interstitial lung disease) ([employer]) Reviewed pulmonary function testing Stability seen on PFTs Patient reporting no acute or worsening symptoms Tolerating Ofev well Occasional diarrhea which is managed with Imodium Walk today in office, brief oxygen level dropped to 88% on room air, recovered quickly  Plan: Spirometry with DLCO in 6 months Lab work today 55-monthfollow-up with Dr. AElsworth SohoContinue Ofev Walk today in office 6-minute walk ordered in 2 weeks 2-week follow-up with BWyn Quaker FNP   OSA (obstructive sleep apnea) Plan:  Continue CPAP     Return in about 3  months (around 12/11/2019), or if symptoms worsen or fail to improve, for Follow up with Dr. Elsworth Soho.   Lauraine Rinne, NP 09/11/2019   This appointment required 33 minutes of patient care (this includes precharting, chart review, review of results, face-to-face care, etc.).

## 2019-09-10 NOTE — Patient Instructions (Signed)

## 2019-09-11 ENCOUNTER — Other Ambulatory Visit: Payer: Self-pay

## 2019-09-11 ENCOUNTER — Ambulatory Visit: Payer: Medicare Other | Admitting: Pulmonary Disease

## 2019-09-11 ENCOUNTER — Encounter: Payer: Self-pay | Admitting: Pulmonary Disease

## 2019-09-11 ENCOUNTER — Ambulatory Visit (INDEPENDENT_AMBULATORY_CARE_PROVIDER_SITE_OTHER): Payer: Medicare Other | Admitting: Pulmonary Disease

## 2019-09-11 VITALS — BP 158/106 | HR 58 | Temp 98.3°F | Ht 62.0 in | Wt 210.8 lb

## 2019-09-11 DIAGNOSIS — G4733 Obstructive sleep apnea (adult) (pediatric): Secondary | ICD-10-CM | POA: Diagnosis not present

## 2019-09-11 DIAGNOSIS — Z5181 Encounter for therapeutic drug level monitoring: Secondary | ICD-10-CM | POA: Diagnosis not present

## 2019-09-11 DIAGNOSIS — J849 Interstitial pulmonary disease, unspecified: Secondary | ICD-10-CM

## 2019-09-11 LAB — PULMONARY FUNCTION TEST
DL/VA % pred: 97 %
DL/VA: 4.1 ml/min/mmHg/L
DLCO unc % pred: 67 %
DLCO unc: 13.44 ml/min/mmHg
FEF 25-75 Post: 2.9 L/sec
FEF 25-75 Pre: 2.17 L/sec
FEF2575-%Change-Post: 33 %
FEF2575-%Pred-Post: 163 %
FEF2575-%Pred-Pre: 122 %
FEV1-%Change-Post: 4 %
FEV1-%Pred-Post: 81 %
FEV1-%Pred-Pre: 77 %
FEV1-Post: 1.88 L
FEV1-Pre: 1.79 L
FEV1FVC-%Change-Post: -1 %
FEV1FVC-%Pred-Pre: 116 %
FEV6-%Change-Post: 6 %
FEV6-%Pred-Post: 75 %
FEV6-%Pred-Pre: 70 %
FEV6-Post: 2.21 L
FEV6-Pre: 2.08 L
FEV6FVC-%Pred-Post: 107 %
FEV6FVC-%Pred-Pre: 107 %
FVC-%Change-Post: 6 %
FVC-%Pred-Post: 69 %
FVC-%Pred-Pre: 65 %
FVC-Post: 2.21 L
FVC-Pre: 2.08 L
Post FEV1/FVC ratio: 85 %
Post FEV6/FVC ratio: 100 %
Pre FEV1/FVC ratio: 86 %
Pre FEV6/FVC Ratio: 100 %
RV % pred: 46 %
RV: 0.94 L
TLC % pred: 64 %
TLC: 3.48 L

## 2019-09-11 NOTE — Assessment & Plan Note (Addendum)
Reviewed pulmonary function testing Stability seen on PFTs Patient reporting no acute or worsening symptoms Tolerating Ofev well Occasional diarrhea which is managed with Imodium Walk today in office, brief oxygen level dropped to 88% on room air, recovered quickly  Plan: Spirometry with DLCO in 6 months Lab work today 56-month follow-up with Dr. Vassie Loll Continue Ofev Walk today in office 6-minute walk ordered in 2 weeks 2-week follow-up with Elisha Headland, FNP

## 2019-09-11 NOTE — Progress Notes (Signed)
PFT completed today.  

## 2019-09-11 NOTE — Assessment & Plan Note (Signed)
Plan: Continue CPAP 

## 2019-09-11 NOTE — Patient Instructions (Addendum)
You were seen today by Gary Ceo, NP  for:   1. ILD (interstitial lung disease) (HCC)  - Pulmonary function test; Future  Continue Ofev  I have placed an order for hepatic function panel to monitor your lab work, if you would prefer you can obtain this at your primary care office does have those results faxed to our office  Walk today in office, brief oxygen desaturation requiring 2 L of O2, with quick recovery  We will schedule 6-minute walk  2. OSA (obstructive sleep apnea)  We recommend that you continue using your CPAP daily >>>Keep up the hard work using your device >>> Goal should be wearing this for the entire night that you are sleeping, at least 4 to 6 hours  Remember:  . Do not drive or operate heavy machinery if tired or drowsy.  . Please notify the supply company and office if you are unable to use your device regularly due to missing supplies or machine being broken.  . Work on maintaining a healthy weight and following your recommended nutrition plan  . Maintain proper daily exercise and movement  . Maintaining proper use of your device can also help improve management of other chronic illnesses such as: Blood pressure, blood sugars, and weight management.   BiPAP/ CPAP Cleaning:  >>>Clean weekly, with Dawn soap, and bottle brush.  Set up to air dry. >>> Wipe mask out daily with wet wipe or towelette    3. Therapeutic drug monitoring  - Hepatic function panel; Future  Can obtain lab work at primary care if this would be easier for you if not there is an open order placed for our E lumbar lab location [address].  We recommend today:  Orders Placed This Encounter  Procedures  . Hepatic function panel    Standing Status:   Future    Standing Expiration Date:   09/10/2020  . Pulmonary function test    Cleda Daub with dlco in late 02/2020    Standing Status:   Future    Standing Expiration Date:   09/10/2020    Order Specific Question:   Where should this  test be performed?    Answer:   [facility]  . 6 minute walk    Standing Status:   Future    Standing Expiration Date:   09/10/2020    Order Specific Question:   Where should this test be performed?    Answer:   [facility]    Order Specific Question:   Release to patient    Answer:   Immediate   Orders Placed This Encounter  Procedures  . Hepatic function panel  . Pulmonary function test  . 6 minute walk   No orders of the defined types were placed in this encounter.   Follow Up:    Return in about 3 months (around 12/11/2019), or if symptoms worsen or fail to improve, for Follow up with Dr. Vassie Loll.  2 week follow up with Elisha Headland FNP with 6 min walk   Please do your part to reduce the spread of COVID-19:      Reduce your risk of any infection  and COVID19 by using the similar precautions used for avoiding the common cold or flu:  Marland Kitchen Wash your hands often with soap and warm water for at least 20 seconds.  If soap and water are not readily available, use an alcohol-based hand sanitizer with at least 60% alcohol.  . If coughing or sneezing,  cover your mouth and nose by coughing or sneezing into the elbow areas of your shirt or coat, into a tissue or into your sleeve (not your hands). Langley Gauss A MASK when in public  . Avoid shaking hands with others and consider head nods or verbal greetings only. . Avoid touching your eyes, nose, or mouth with unwashed hands.  . Avoid close contact with people who are sick. . Avoid places or events with large numbers of people in one location, like concerts or sporting events. . If you have some symptoms but not all symptoms, continue to monitor at home and seek medical attention if your symptoms worsen. . If you are having a medical emergency, call 911.   [place] / e-Visit: [email or web address]         [facility]: [facility]: [phone]                   [facility]: [phone]     It is flu season:   >>> Best ways to protect herself from the flu: Receive the yearly flu vaccine, practice good hand hygiene washing with soap and also using hand sanitizer when available, eat a nutritious meals, get adequate rest, hydrate appropriately   Please contact the office if your symptoms worsen or you have concerns that you are not improving.   Thank you for choosing [facility] for your healthcare, and for allowing [place] to partner with you on your healthcare journey. I am thankful to be able to provide care to you today.   Gary Quaker FNP-C

## 2019-10-02 ENCOUNTER — Ambulatory Visit: Payer: Medicare Other

## 2019-10-06 NOTE — Progress Notes (Signed)
@Patient  ID: Gary Keller, male    DOB: 1950/05/24, 69 y.o.   MRN: [id number]  Chief Complaint  Patient presents with  . Follow-up    2 wk f/u for ILD after 63m. States his breathing has been ok since last visit. Did not need O2 after walk. Denied any new symptoms.     Referring provider: AVicenta Aly FNP  HPI:  69year old male former smoker followed in our office for IPF and obstructive sleep apnea  PMH: Hypertension, dyslipidemia, depression Smoker/ Smoking History: Former smoker Maintenance: Ofev Pt of: Dr. AElsworth Soho 10/07/2019  - Visit   69year old male former smoker followed in our office for interstitial lung disease, IPF and obstructive sleep apnea.  Patient completing 2-week follow-up with a 6-minute walk today.  There was concerns at last office visit the patient may be having exertional hypoxemia.  Patient completed 6-minute walk today without any desats.  Lowest oxygen level was 88%.  Patient briefly went to 88% as soon as he stopped he came up quickly to 90%.  Patient feels that he is clinically stable and he is tolerating the Ofev well.  Questionaires / Pulmonary Flowsheets:   MMRC: mMRC Dyspnea Scale mMRC Score  10/07/2019 1  09/11/2019 1    Tests:   PFT 12/2018 >>FVC 66%, TLC 72%, DLCO 64% 02/2018 FVC 66%  1/2019ratio 88, FVC 71%, DLCO 76%  01/06/17:FVC 2.29 L (67%) FEV1 2.03 L (81%) FEV1/FVC 0.88 DLCO corrected 75%  06/24/16: FVC 2.49 L (76%) FEV1 2.21 L (91%) FEV1/FVC 0.89 negative bronchodilator response TLC 3.77 L (69%) DLCO uncorrected 66%  6MWT 01/06/17:Walked 412 meters / Baseline Sat 99% on RA / Nadir Sat 90% on RA @ end of test   PSG(06/07/16): AHI 15.0 events/hour., nadir satn78%>>Optimal CPAP pressure 14 cm    HRCT 02/2019  c/w UIP , air trapping raises concern for HP, minimally worse compared to 2018  CT CHEST W/O 07/2016 : 1.1 CM subpleural nodule adjacent major fissure left lower lobe unchanged in size  compared with imaging from 2008. Fibrotic changes again noted.   HRCT CHEST W/O 05/2016  minimal subpleural patchy groundglass. Traction bronchiectasis with areas of honeycombing and subpleural as well as central distribution of reticulation with intralobular septal thickening. There is aslight craniocaudal progression suggestive of a UIP pattern. Subcarinal and precarinal lymph nodes measuring up to 1.1 &1.2 cm in short axis. Patient does have an 1.1 CM oblong nodule within his left lower lobe that corresponds to previously noted left lower lobe nodule.   CT CHEST W/ 04/2016 :9 mm left lower lobe nodule. Diffuse interlobular septal thickening with architectural distortion inferiorly with honeycombing versus bronchiectasis and medial right lower lobe. Fatty infiltration of the liver noted.  CT CHEST W/ 07/2007:. Irregular interstitial prominence in addition. Ill-defined cystic lucencies diffusely. Fracture of left 10th rib noted.   FENO:  No results found for: NITRICOXIDE  PFT: PFT Results Latest Ref Rng & Units 09/11/2019 01/10/2019 06/13/2017 01/06/2017 09/29/2016 06/24/2016  FVC-Pre L 2.08 2.22 2.41 2.29 2.31 2.49  FVC-Predicted Pre % 65 66 71 67 67 76  FVC-Post L 2.21 2.36 - - - 2.46  FVC-Predicted Post % 69 70 - - - 75  Pre FEV1/FVC % % 86 86 88 88 87 89  Post FEV1/FCV % % 85 84 - - - 88  FEV1-Pre L 1.79 1.90 2.13 2.03 2.00 2.21  FEV1-Predicted Pre % 77 77 85 81 79 91  FEV1-Post L 1.88 1.99 - - - 2.16  DLCO UNC% % 67 64 76 75 69 66  DLCO COR %Predicted % 97 97 108 119 104 105  TLC L 3.48 4.06 - - - 3.77  TLC % Predicted % 64 72 - - - 69  RV % Predicted % 46 83 - - - 53    WALK:  SIX MIN WALK 10/07/2019 09/11/2019 07/06/2018 01/06/2017 09/29/2016 06/24/2016  Medications No meds taken - - pt stated that he took all of his meds this morning around 7 am lipitor 61m, xyrtec 146m plavix 7531mfenofibrate 160m7mozarr 100mg41mprol-xl 25mg,29mtac 75mg &21mexor-xr 75 all taken at 7:15am  Lipitor 40mg, Z87mc 10mg, Pl61m 75mg, Fen69mrate 160mg, Coza57m00mg, Topro74m 25mg, Zantac13mg and Effe64mXR 75mg ---ALL at28mam  Supplimental Oxygen during Test? (L/min) No - No No No No  Laps 12 - - 8 7 9   Partial Lap (in Meters) 22 - - 28 18 20   Baseline BP (sitting) 130/80 - - 136/80 136/74 128/70  Baseline Heartrate 82 - - 99 74 113  Baseline Dyspnea (Borg Scale) 0 - - 0 0 0  Baseline Fatigue (Borg Scale) 0 - - 0 0 0  Baseline SPO2 95 - - 99 99 97  BP (sitting) 132/70 - - 174/92 150/80 164/70  Heartrate 116 - - 141 121 146  Dyspnea (Borg Scale) 1 - - 0.5 0 1  Fatigue (Borg Scale) 2 - - 2 0 1  SPO2 90 - - 90 91 92  BP (sitting) 132/82 - - 144/82 146/78 152/76  Heartrate 94 - - 103 91 125  SPO2 96 - - 100 99 100  Stopped or Paused before Six Minutes No - - No No No  Interpretation Hip pain - - - - -  Distance Completed 430 - - 412 354 452  Distance Completed 22 - - - - -  Tech Comments: Pt walked at an average pace without stopping. Pt had complaints of hip pain which is normal with walking. Sats were 88% on RA when walk ended but went up to 90% after 2sec. pt walked steady pace O2 dropped 500 yards of walking\\sb moderate walking pace - completed with mild SOB at end of walk.  SpO2 remained 90% and above.  [name] Joella Princehat he did have some sligh chest pressure but that is normal for him.  completed test without difficulty test preformed with forehead probe. pt completed test with no desats or complaints.  pt walked a fast pace during the walk test, no desat. Pt c/o leg tightness at the end of the test - no pain    Imaging: No results found.  Lab Results:  CBC    Component Value Date/Time   WBC 5.3 11/04/2008 0605   RBC 4.33 11/04/2008 0605   HGB 13.8 11/04/2008 0605   HCT 38.4 (L) 11/04/2008 0605   PLT 182 11/04/2008 0605   MCV 88.7 11/04/2008 0605   MCHC 36.0 11/04/2008 0605   RDW 12.5 11/04/2008 0605   LYMPHSABS 2.4 10/31/2008 1547   MONOABS 0.5  10/31/2008 1547   EOSABS 0.1 10/31/2008 1547   BASOSABS 0.0 10/31/2008 1547    BMET    Component Value Date/Time   NA 142 12/02/2009 0000   K 4.2 12/02/2009 0000   CL 108 12/02/2009 0000   CO2 33 (H) 12/02/2009 0000   GLUCOSE 88 12/02/2009 0000   BUN 27 (H) 12/02/2009 0000   CREATININE 1.2 12/02/2009 0000   CALCIUM 8.8 12/02/2009 0000  GFRNONAA 66.19 12/02/2009 0000   GFRAA  11/04/2008 0605    >60        The eGFR has been calculated using the MDRD equation. This calculation has not been validated in all clinical situations. eGFR's persistently <60 mL/min signify possible Chronic Kidney Disease.    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 Fluad Quad(high Dose 65+) 01/10/2019  . Influenza, High Dose Seasonal PF 02/13/2018  . Influenza,inj,Quad PF,6+ Mos 05/27/2016, 01/06/2017  . PFIZER SARS-COV-2 Vaccination 06/24/2019, 07/20/2019  . Pneumococcal Polysaccharide-23 06/24/2016    Past Medical History:  Diagnosis Date  . CAD (coronary artery disease)   . Depression   . Diabetes mellitus (HCC)    Borderline  . Encounter for screening for malignant neoplasm of prostate   . HTN (hypertension)   . Hyperlipidemia   . Non morbid obesity due to excess calories   . Renal insufficiency   . Rhinitis, allergic     Tobacco History: Social History   Tobacco Use  Smoking Status Former Smoker  . Packs/day: 1.00  . Years: 43.00  . Pack years: 43.00  . Start date: 111 . Quit date: 05/16/2001  . Years since quitting: 18.4  Smokeless Tobacco Never Used   Counseling given: Yes   Continue to not  smoke  Outpatient Encounter Medications as of 10/07/2019  Medication Sig  . atorvastatin (LIPITOR) 40 MG tablet Take 40 mg by mouth daily at 6 PM.   . cetirizine (ZYRTEC) 10 MG tablet Take 10 mg by mouth daily.   . clopidogrel (PLAVIX) 75 MG tablet Take 75 mg by mouth daily.  . fenofibrate 160 MG tablet Take 160 mg by mouth daily.   .Marland Kitchenlosartan (COZAAR) 100 MG tablet Take 50 mg by mouth daily.  . metFORMIN (GLUCOPHAGE) 1000 MG tablet Take 500 mg by mouth daily with breakfast.   . metoprolol succinate (TOPROL XL) 25 MG 24 hr tablet Take 1 tablet (25 mg total) by mouth daily.  . Nintedanib (OFEV) 150 MG CAPS Take 150 mg by mouth 2 (two) times daily.  . Omega-3 Fatty Acids (FISH OIL) 1000 MG CAPS Take 1,000 mg by mouth daily.  . ranitidine (ZANTAC) 75 MG tablet Take 75 mg by mouth 2 (two) times daily.  .Marland Kitchenvenlafaxine XR (EFFEXOR-XR) 75 MG 24 hr capsule Take 75 mg by mouth daily with breakfast.    No facility-administered encounter medications on file as of 10/07/2019.     Review of Systems  Review of Systems  Constitutional: Negative for activity change, chills, fatigue, fever and unexpected weight change.  HENT: Negative for postnasal drip, rhinorrhea, sinus pressure, sinus pain and sore throat.   Eyes: Negative.   Respiratory: Negative for cough, shortness of breath and wheezing.   Cardiovascular: Negative for chest pain and palpitations.  Gastrointestinal: Negative for constipation, diarrhea, nausea and vomiting.  Endocrine: Negative.   Genitourinary: Negative.   Musculoskeletal: Negative.   Skin: Negative.   Neurological: Negative for dizziness and headaches.  Psychiatric/Behavioral: Negative.  Negative for dysphoric mood. The patient is not nervous/anxious.   All other systems reviewed and are negative.    Physical Exam  BP 132/82   Pulse 94   Temp 98.3 F (36.8 C) (Temporal)   Ht 5' 2"  (1.575 m)   Wt 205 lb 12.8 oz (93.4 kg)   SpO2 96% Comment: on RA  BMI 37.64 kg/m    Wt Readings from Last 5 Encounters:  10/07/19 205 lb 12.8 oz (93.4 kg)  09/11/19 210 lb 12.8 oz (95.6 kg)  09/10/19 215 lb (97.5 kg)  06/13/19 216 lb 3.2 oz (98.1 kg)  03/13/19 215 lb (97.5 kg)    BMI Readings from Last 5 Encounters:  10/07/19 37.64 kg/m  09/11/19 38.56 kg/m  09/10/19 38.09 kg/m  06/13/19 38.30 kg/m  03/13/19 39.32 kg/m     Physical Exam Vitals and nursing note reviewed.  Constitutional:      General: He is not in acute distress.    Appearance: Normal appearance. He is obese.  HENT:     Head: Normocephalic and atraumatic.     Right Ear: Hearing and external ear normal.     Left Ear: Hearing and external ear normal.     Nose: No mucosal edema.     Right Turbinates: Not enlarged.     Left Turbinates: Not enlarged.     Mouth/Throat:     Mouth: Mucous membranes are dry.     Pharynx: Oropharynx is clear. No oropharyngeal exudate.  Eyes:     Pupils: Pupils are equal, round, and reactive to light.  Cardiovascular:     Rate and Rhythm: Normal rate and regular rhythm.     Pulses: Normal pulses.     Heart sounds: Normal heart sounds. No murmur.  Pulmonary:     Effort: Pulmonary effort is normal.     Breath sounds: Rales (right basilar crackles ) present. No decreased breath sounds or wheezing.  Musculoskeletal:     Cervical back: Normal range of motion.     Right lower leg: No edema.     Left lower leg: No edema.  Lymphadenopathy:     Cervical: No cervical adenopathy.  Skin:    General: Skin is warm and dry.     Capillary Refill: Capillary refill takes less than 2 seconds.     Findings: No erythema or rash.  Neurological:     General: No focal deficit present.     Mental Status: He is alert and oriented to person, place, and time.     Motor: No weakness.     Coordination: Coordination normal.     Gait: Gait is intact. Gait normal.  Psychiatric:        Mood and Affect: Mood normal.        Behavior: Behavior normal. Behavior is cooperative.         Thought Content: Thought content normal.        Judgment: Judgment normal.       Assessment & Plan:   OSA (obstructive sleep apnea) Plan: Continue CPAP  ILD (interstitial lung disease) ([place]) Reviewed pulmonary function testing Stability seen on PFTs Patient reporting no acute or worsening symptoms Tolerating Ofev well Occasional diarrhea which is managed with Imodium  Plan: Spirometry with DLCO in 6 months Lab work today 47-monthfollow-up with Dr. AElsworth SohoContinue Ofev     At patient's request I did also speak with his daughter.  Discussed prognosis as well as 6-minute walk results.  All questions  were answered.  She agrees with follow-up.  I have encouraged the patient to consider pulmonary rehab in the future.  He is working on increasing his own physical activity at home.  He also watches his 2 grandkids.  He is working on weight loss.  Praised patient on his focus on improving his health.  Return in about 3 months (around 01/07/2020), or if symptoms worsen or fail to improve, for Follow up with Dr. Elsworth Soho.   Lauraine Rinne, NP 10/07/2019   This appointment required 25 minutes of patient care (this includes precharting, chart review, review of results, face-to-face care, etc.).

## 2019-10-07 ENCOUNTER — Encounter: Payer: Self-pay | Admitting: Pulmonary Disease

## 2019-10-07 ENCOUNTER — Ambulatory Visit (INDEPENDENT_AMBULATORY_CARE_PROVIDER_SITE_OTHER): Payer: Medicare Other

## 2019-10-07 ENCOUNTER — Ambulatory Visit: Payer: Medicare Other | Admitting: Pulmonary Disease

## 2019-10-07 ENCOUNTER — Other Ambulatory Visit: Payer: Self-pay

## 2019-10-07 VITALS — BP 132/82 | HR 94 | Temp 98.3°F | Ht 62.0 in | Wt 205.8 lb

## 2019-10-07 DIAGNOSIS — J849 Interstitial pulmonary disease, unspecified: Secondary | ICD-10-CM

## 2019-10-07 DIAGNOSIS — G4733 Obstructive sleep apnea (adult) (pediatric): Secondary | ICD-10-CM | POA: Diagnosis not present

## 2019-10-07 NOTE — Progress Notes (Signed)
Six Minute Walk - 10/07/19 0939      Six Minute Walk   Medications taken before test (dose and time)  No meds taken    Supplemental oxygen during test?  No    Lap distance in meters   34 meters    Laps Completed  12    Partial lap (in meters)  22 meters    Baseline BP (sitting)  130/80    Baseline Heartrate  82    Baseline Dyspnea (Borg Scale)  0    Baseline Fatigue (Borg Scale)  0    Baseline SPO2  95 %      End of Test Values    BP (sitting)  132/70    Heartrate  116    Dyspnea (Borg Scale)  1    Fatigue (Borg Scale)  2    SPO2  90 %      2 Minutes Post Walk Values   BP (sitting)  132/82    Heartrate  94    SPO2  96 %    Stopped or paused before six minutes?  No    Other Symptoms at end of exercise:  Hip pain   Normal with this amount of walking     Interpretation   Distance completed  430 meters    Tech Comments:  Pt walked at an average pace without stopping. Pt had complaints of hip pain which is normal with walking. Sats were 88% on RA when walk ended but went up to 90% after 2sec.

## 2019-10-07 NOTE — Patient Instructions (Addendum)
You were seen today by Coral Ceo, NP  for:   1. ILD (interstitial lung disease) (HCC)  - Pulmonary function test; Future  Continue Ofev    2. OSA (obstructive sleep apnea)  We recommend that you continue using your CPAP daily >>>Keep up the hard work using your device >>> Goal should be wearing this for the entire night that you are sleeping, at least 4 to 6 hours  Remember:  . Do not drive or operate heavy machinery if tired or drowsy.  . Please notify the supply company and office if you are unable to use your device regularly due to missing supplies or machine being broken.  . Work on maintaining a healthy weight and following your recommended nutrition plan  . Maintain proper daily exercise and movement  . Maintaining proper use of your device can also help improve management of other chronic illnesses such as: Blood pressure, blood sugars, and weight management.   BiPAP/ CPAP Cleaning:  >>>Clean weekly, with Dawn soap, and bottle brush.  Set up to air dry. >>> Wipe mask out daily with wet wipe or towelette    3. Therapeutic drug monitoring  Liver level stable with primary care  Follow Up:    Return in about 3 months (around 01/07/2020), or if symptoms worsen or fail to improve, for Follow up with Dr. Vassie Loll.  PFT - , Spiro with DLCO - October /2021      Please do your part to reduce the spread of COVID-19:      Reduce your risk of any infection  and COVID19 by using the similar precautions used for avoiding the common cold or flu:  Marland Kitchen Wash your hands often with soap and warm water for at least 20 seconds.  If soap and water are not readily available, use an alcohol-based hand sanitizer with at least 60% alcohol.  . If coughing or sneezing, cover your mouth and nose by coughing or sneezing into the elbow areas of your shirt or coat, into a tissue or into your sleeve (not your hands). Drinda Butts A MASK when in public  . Avoid shaking hands with others and  consider head nods or verbal greetings only. . Avoid touching your eyes, nose, or mouth with unwashed hands.  . Avoid close contact with people who are sick. . Avoid places or events with large numbers of people in one location, like concerts or sporting events. . If you have some symptoms but not all symptoms, continue to monitor at home and seek medical attention if your symptoms worsen. . If you are having a medical emergency, call 911.   ADDITIONAL HEALTHCARE OPTIONS FOR PATIENTS  [facility] / e-Visit: [email or web address]         [facility]: [phone]  [facility]: [phone]                   [facility]: [phone]     It is flu season:   >>> Best ways to protect herself from the flu: Receive the yearly flu vaccine, practice good hand hygiene washing with soap and also using hand sanitizer when available, eat a nutritious meals, get adequate rest, hydrate appropriately   Please contact the office if your symptoms worsen or you have concerns that you are not improving.   Thank you for choosing [facility] for your healthcare, and for allowing [place] to partner with you on your healthcare journey. I am thankful to  be able to provide care to you today.   Wyn Quaker FNP-C

## 2019-10-07 NOTE — Assessment & Plan Note (Signed)
Reviewed pulmonary function testing Stability seen on PFTs Patient reporting no acute or worsening symptoms Tolerating Ofev well Occasional diarrhea which is managed with Imodium  Plan: Spirometry with DLCO in 6 months Lab work today 62-month follow-up with Dr. Vassie Loll Continue Durel Salts

## 2019-10-07 NOTE — Assessment & Plan Note (Signed)
Plan: Continue CPAP 

## 2019-12-17 MED ORDER — OFEV 150 MG PO CAPS: 150.0000 mg | ORAL_CAPSULE | Freq: Two times a day (BID) | ORAL | 5 refills | Status: DC

## 2019-12-17 MED ORDER — OFEV 150 MG PO CAPS: 150.0000 mg | ORAL_CAPSULE | Freq: Two times a day (BID) | ORAL | 11 refills | Status: DC

## 2019-12-17 NOTE — Telephone Encounter (Signed)
Called [facility] and spoke with pharmacist Selena Batten in regards to needing to give verbal Rx for pt's OFEV. Per Selena Batten, they are currently dispensing a 44-month supply of the Rx for pts. I have given a verbal of pt's OFEV 150mg  for pt to have a 48-month supply dispensed (#120 capsules) with 5 refills. mychart message was also sent to pt in regards to this letting him know that it was taken care of. Nothing further needed.

## 2020-02-13 ENCOUNTER — Ambulatory Visit: Payer: Medicare Other | Attending: Internal Medicine

## 2020-02-13 DIAGNOSIS — Z23 Encounter for immunization: Secondary | ICD-10-CM

## 2020-02-13 NOTE — Progress Notes (Signed)
   Covid-19 Vaccination Clinic  Name:  Gary Keller    MRN: [id number] DOB: 07/26/1950  02/13/2020  Gary Keller was observed post Covid-19 immunization for 15 minutes without incident. He was provided with Vaccine Information Sheet and instruction to access the V-Safe system.   Gary Keller was instructed to call 911 with any severe reactions post vaccine: Marland Kitchen Difficulty breathing  . Swelling of face and throat  . A fast heartbeat  . A bad rash all over body  . Dizziness and weakness

## 2020-03-05 NOTE — Progress Notes (Signed)
HPI: FUCAD. Cardiac catheterization 6/10 revealed normal LV function. There was significant coronary disease and he had PCI of his posterior lateral and right coronary artery. He also had PCI of his PDA. Note all of these were drug-eluting stents. Nuclear study November 2016 showed ejection fraction 56%. There were ECG changes but perfusion normal.Abd ultrasound 12/17 showed no aneurysm. Chest CT October 2020 showed interstitial lung disease (likely UIP) and 4 cm ascending thoracic aorta.  Since last seen,patient denies chest pain, palpitations or syncope.  He does have dyspnea on exertion due to his interstitial lung disease.  Current Outpatient Medications  Medication Sig Dispense Refill   atorvastatin (LIPITOR) 40 MG tablet Take 40 mg by mouth daily at 6 PM.      cetirizine (ZYRTEC) 10 MG tablet Take 10 mg by mouth daily.      clopidogrel (PLAVIX) 75 MG tablet Take 75 mg by mouth daily.     fenofibrate 160 MG tablet Take 160 mg by mouth daily.      losartan (COZAAR) 100 MG tablet Take 50 mg by mouth daily.     metFORMIN (GLUCOPHAGE) 1000 MG tablet Take 500 mg by mouth daily with breakfast.      metoprolol succinate (TOPROL XL) 25 MG 24 hr tablet Take 1 tablet (25 mg total) by mouth daily. 90 tablet 3   Nintedanib (OFEV) 150 MG CAPS Take 1 capsule (150 mg total) by mouth 2 (two) times daily. 120 capsule 5   Omega-3 Fatty Acids (FISH OIL) 1000 MG CAPS Take 1,000 mg by mouth daily.     ranitidine (ZANTAC) 75 MG tablet Take 75 mg by mouth 2 (two) times daily.     venlafaxine XR (EFFEXOR-XR) 75 MG 24 hr capsule Take 75 mg by mouth daily with breakfast.      No current facility-administered medications for this visit.     Past Medical History:  Diagnosis Date   CAD (coronary artery disease)    Depression    Diabetes mellitus (HCC)    Borderline   Encounter for screening for malignant neoplasm of prostate    HTN (hypertension)    Hyperlipidemia    Non morbid  obesity due to excess calories    Renal insufficiency    Rhinitis, allergic     Past Surgical History:  Procedure Laterality Date   APPENDECTOMY     COLONOSCOPY WITH ESOPHAGOGASTRODUODENOSCOPY (EGD)     LEFT HEART CATH  2006   Stent x3    Social History   Socioeconomic History   Marital status: Single    Spouse name: Not on file   Number of children: 3   Years of education: Not on file   Highest education level: Not on file  Occupational History    Comment: Retired  Tobacco Use   Smoking status: Former Smoker    Packs/day: 1.00    Years: 43.00    Pack years: 43.00    Start date: 51    Quit date: 05/16/2001    Years since quitting: 18.8   Smokeless tobacco: Never Used  Substance and Sexual Activity   Alcohol use: Yes    Alcohol/week: 0.0 standard drinks    Comment: Occasional   Drug use: No   Sexual activity: Not on file  Other Topics Concern   Not on file  Social History Narrative   [facility] (05/27/16):   Originally from [facility]. Has always lived in [place]. Previously worked as a Naval architect and also farming. He  grew tobacco. Does have exposure to chemicals from spraying. No mold exposure. Remote cockatiel exposure in a previous home. No hot tub exposure. Enjoys watching his grandchildren and hunting.    Social Determinants of Health   Financial Resource Strain:    Difficulty of Paying Living Expenses: Not on file  Food Insecurity:    Worried About Programme researcher, broadcasting/film/video in the Last Year: Not on file   [employer] of Food in the Last Year: Not on file  Transportation Needs:    Lack of Transportation (Medical): Not on file   Lack of Transportation (Non-Medical): Not on file  Physical Activity:    Days of Exercise per Week: Not on file   Minutes of Exercise per Session: Not on file  Stress:    Feeling of Stress : Not on file  Social Connections:    Frequency of Communication with Friends and Family: Not on file   Frequency of Social  Gatherings with Friends and Family: Not on file   Attends Religious Services: Not on file   Active Member of Clubs or Organizations: Not on file   Attends Banker Meetings: Not on file   Marital Status: Not on file  Intimate Partner Violence:    Fear of Current or Ex-Partner: Not on file   Emotionally Abused: Not on file   Physically Abused: Not on file   Sexually Abused: Not on file    Family History  Problem Relation Age of Onset   Diabetes Brother    Heart disease Brother    Heart disease Father    Hypertension Brother    Stroke Mother    Stroke Brother    Lung disease Neg Hx    Rheumatologic disease Neg Hx     ROS: no fevers or chills, productive cough, hemoptysis, dysphasia, odynophagia, melena, hematochezia, dysuria, hematuria, rash, seizure activity, orthopnea, PND, pedal edema, claudication. Remaining systems are negative.  Physical Exam: Well-developed obese in no acute distress.  Skin is warm and dry.  HEENT is normal.  Neck is supple.  Chest is clear to auscultation with normal expansion.  Cardiovascular exam is regular rate and rhythm.  Abdominal exam nontender or distended. No masses palpated. Extremities show no edema. neuro grossly intact  ECG-sinus rhythm at a rate of 87, no ST changes.  Personally reviewed  A/P  1 coronary artery disease-patient doing well with no exertional chest pain.  Continue Plavix and statin.  2 hypertension-blood pressure elevated; add amlodipine 5 mg daily and follow.  Check potassium and renal function.  3 dilated thoracic aorta-plan follow-up CT October 2022.  4 hyperlipidemia-continue statin.  Check lipids and liver.  5 obesity-needs diet and weight loss.  6 interstitial lung disease-per pulmonary.  Olga Millers, MD

## 2020-03-09 ENCOUNTER — Other Ambulatory Visit (HOSPITAL_COMMUNITY)
Admission: RE | Admit: 2020-03-09 | Discharge: 2020-03-09 | Disposition: A | Discharge status: Home / Self Care | Payer: Medicare Other | Source: Ambulatory Visit | Attending: Pulmonary Disease | Admitting: Pulmonary Disease

## 2020-03-09 DIAGNOSIS — Z20822 Contact with and (suspected) exposure to covid-19: Secondary | ICD-10-CM | POA: Diagnosis not present

## 2020-03-09 DIAGNOSIS — Z01812 Encounter for preprocedural laboratory examination: Secondary | ICD-10-CM | POA: Insufficient documentation

## 2020-03-09 LAB — SARS CORONAVIRUS 2 (TAT 6-24 HRS): SARS Coronavirus 2: NEGATIVE

## 2020-03-11 ENCOUNTER — Encounter: Payer: Self-pay | Admitting: Cardiology

## 2020-03-11 ENCOUNTER — Other Ambulatory Visit: Payer: Self-pay

## 2020-03-11 ENCOUNTER — Ambulatory Visit: Payer: Medicare Other | Admitting: Cardiology

## 2020-03-11 VITALS — BP 158/92 | HR 87 | Ht 62.0 in | Wt 212.2 lb

## 2020-03-11 DIAGNOSIS — I2510 Atherosclerotic heart disease of native coronary artery without angina pectoris: Secondary | ICD-10-CM | POA: Diagnosis not present

## 2020-03-11 DIAGNOSIS — I10 Essential (primary) hypertension: Secondary | ICD-10-CM | POA: Diagnosis not present

## 2020-03-11 DIAGNOSIS — E7800 Pure hypercholesterolemia, unspecified: Secondary | ICD-10-CM

## 2020-03-11 MED ORDER — AMLODIPINE BESYLATE 5 MG PO TABS: 5.0000 mg | ORAL_TABLET | Freq: Every day | ORAL | 3 refills | Status: DC

## 2020-03-11 NOTE — Patient Instructions (Signed)
Medication Instructions:   START AMLODIPINE 5 MG ONCE DAILY  *If you need a refill on your cardiac medications before your next appointment, please call your pharmacy*   Lab Work:  Your physician recommends that you return for lab work FASTING  If you have labs (blood work) drawn today and your tests are completely normal, you will receive your results only by: Marland Kitchen MyChart Message (if you have MyChart) OR . A paper copy in the mail If you have any lab test that is abnormal or we need to change your treatment, we will call you to review the results   Follow-Up: At [facility], you and your health needs are our priority.  As part of our continuing mission to provide you with exceptional heart care, we have created designated Provider Care Teams.  These Care Teams include your primary Cardiologist (physician) and Advanced Practice Providers (APPs -  Physician Assistants and Nurse Practitioners) who all work together to provide you with the care you need, when you need it.  We recommend signing up for the patient portal called "MyChart".  Sign up information is provided on this After Visit Summary.  MyChart is used to connect with patients for Virtual Visits (Telemedicine).  Patients are able to view lab/test results, encounter notes, upcoming appointments, etc.  Non-urgent messages can be sent to your provider as well.   To learn more about what you can do with MyChart, go to [email or web address].    Your next appointment:   12 month(s)  The format for your next appointment:   In Person  Provider:   Olga Millers, MD

## 2020-03-12 ENCOUNTER — Ambulatory Visit (INDEPENDENT_AMBULATORY_CARE_PROVIDER_SITE_OTHER): Payer: Medicare Other | Admitting: Pulmonary Disease

## 2020-03-12 DIAGNOSIS — J849 Interstitial pulmonary disease, unspecified: Secondary | ICD-10-CM

## 2020-03-12 LAB — PULMONARY FUNCTION TEST
DL/VA % pred: 107 %
DL/VA: 4.51 ml/min/mmHg/L
DLCO cor % pred: 78 %
DLCO cor: 15.69 ml/min/mmHg
DLCO unc % pred: 78 %
DLCO unc: 15.69 ml/min/mmHg
FEF 25-75 Pre: 2.37 L/sec
FEF2575-%Pred-Pre: 133 %
FEV1-%Pred-Pre: 76 %
FEV1-Pre: 1.75 L
FEV1FVC-%Pred-Pre: 115 %
FEV6-%Pred-Pre: 69 %
FEV6-Pre: 2.04 L
FEV6FVC-%Pred-Pre: 107 %
FVC-%Pred-Pre: 64 %
FVC-Pre: 2.04 L
Pre FEV1/FVC ratio: 85 %
Pre FEV6/FVC Ratio: 100 %

## 2020-03-12 NOTE — Progress Notes (Signed)
Spirometry and DLCO performed today. °

## 2020-03-13 LAB — COMPREHENSIVE METABOLIC PANEL
ALT: 27 IU/L (ref 0–44)
AST: 21 IU/L (ref 0–40)
Albumin/Globulin Ratio: 1.6 (ref 1.2–2.2)
Albumin: 4.6 g/dL (ref 3.8–4.8)
Alkaline Phosphatase: 86 IU/L (ref 44–121)
BUN/Creatinine Ratio: 16 (ref 10–24)
BUN: 22 mg/dL (ref 8–27)
Bilirubin Total: 0.4 mg/dL (ref 0.0–1.2)
CO2: 24 mmol/L (ref 20–29)
Calcium: 9.6 mg/dL (ref 8.6–10.2)
Chloride: 100 mmol/L (ref 96–106)
Creatinine, Ser: 1.39 mg/dL — ABNORMAL HIGH (ref 0.76–1.27)
GFR calc Af Amer: 59 mL/min/{1.73_m2} — ABNORMAL LOW (ref >59)
GFR calc non Af Amer: 51 mL/min/{1.73_m2} — ABNORMAL LOW (ref >59)
Globulin, Total: 2.9 g/dL (ref 1.5–4.5)
Glucose: 108 mg/dL — ABNORMAL HIGH (ref 65–99)
Potassium: 4.8 mmol/L (ref 3.5–5.2)
Sodium: 138 mmol/L (ref 134–144)
Total Protein: 7.5 g/dL (ref 6.0–8.5)

## 2020-03-13 LAB — LIPID PANEL
Chol/HDL Ratio: 5 ratio (ref 0.0–5.0)
Cholesterol, Total: 130 mg/dL (ref 100–199)
HDL: 26 mg/dL — ABNORMAL LOW (ref >39)
LDL Chol Calc (NIH): 60 mg/dL (ref 0–99)
Triglycerides: 273 mg/dL — ABNORMAL HIGH (ref 0–149)
VLDL Cholesterol Cal: 44 mg/dL — ABNORMAL HIGH (ref 5–40)

## 2020-03-18 ENCOUNTER — Other Ambulatory Visit: Payer: Self-pay

## 2020-03-18 ENCOUNTER — Encounter: Payer: Self-pay | Admitting: Pulmonary Disease

## 2020-03-18 ENCOUNTER — Ambulatory Visit: Payer: Medicare Other | Admitting: Pulmonary Disease

## 2020-03-18 VITALS — BP 134/80 | HR 91 | Temp 97.3°F | Ht 62.0 in | Wt 212.2 lb

## 2020-03-18 DIAGNOSIS — Z23 Encounter for immunization: Secondary | ICD-10-CM

## 2020-03-18 DIAGNOSIS — J849 Interstitial pulmonary disease, unspecified: Secondary | ICD-10-CM | POA: Diagnosis not present

## 2020-03-18 DIAGNOSIS — G4733 Obstructive sleep apnea (adult) (pediatric): Secondary | ICD-10-CM

## 2020-03-18 DIAGNOSIS — Z0000 Encounter for general adult medical examination without abnormal findings: Secondary | ICD-10-CM | POA: Diagnosis not present

## 2020-03-18 DIAGNOSIS — Z5181 Encounter for therapeutic drug level monitoring: Secondary | ICD-10-CM | POA: Diagnosis not present

## 2020-03-18 LAB — COMPREHENSIVE METABOLIC PANEL
ALT: 22 U/L (ref 0–53)
AST: 19 U/L (ref 0–37)
Albumin: 4.2 g/dL (ref 3.5–5.2)
Alkaline Phosphatase: 76 U/L (ref 39–117)
BUN: 21 mg/dL (ref 6–23)
CO2: 30 mEq/L (ref 19–32)
Calcium: 9.2 mg/dL (ref 8.4–10.5)
Chloride: 102 mEq/L (ref 96–112)
Creatinine, Ser: 1.49 mg/dL (ref 0.40–1.50)
GFR: 47.21 mL/min — ABNORMAL LOW (ref >60.00)
Glucose, Bld: 179 mg/dL — ABNORMAL HIGH (ref 70–99)
Potassium: 4.1 mEq/L (ref 3.5–5.1)
Sodium: 139 mEq/L (ref 135–145)
Total Bilirubin: 0.5 mg/dL (ref 0.2–1.2)
Total Protein: 7.3 g/dL (ref 6.0–8.3)

## 2020-03-18 LAB — HEPATIC FUNCTION PANEL
ALT: 22 U/L (ref 0–53)
AST: 19 U/L (ref 0–37)
Albumin: 4.2 g/dL (ref 3.5–5.2)
Alkaline Phosphatase: 76 U/L (ref 39–117)
Bilirubin, Direct: 0.1 mg/dL (ref 0.0–0.3)
Total Bilirubin: 0.5 mg/dL (ref 0.2–1.2)
Total Protein: 7.3 g/dL (ref 6.0–8.3)

## 2020-03-18 NOTE — Progress Notes (Signed)
  ID: Gary Keller, male    DOB: [birth year], 69 y.o.   MRN: [id number]  Chief Complaint  Patient presents with   Follow-up    PFT performed 03/12/20.  Pt states he has been okay since last visit. States he is still having problems with diarrhea and stomach cramps with the OFEV.    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 He is established with Dr. Vassie Loll. He was last seen in our office in May/2021. Plan of care from that office visit was as follows: Spirometry with DLCO in 6 months, lab work today, 29-month follow-up with Dr. Vassie Loll and to continue Ofev, continue CPAP therapy.  Patient recently completed pulmonary function testing. Those results are listed below:  03/12/2020-pulmonary function test-spirometry with DLCO-FVC 2.04 (64% predicted), ratio 85, FEV1 1.75 (76% predicted), DLCO 15.69 (78% predicted)  When reviewing April/2021 spirometry with DLCO results this shows stability in patient's FVC as well as improvement in his DLCO.  Patient is up-to-date with his COVID-19 vaccinations. He is also received his booster. He has not yet received his seasonal flu vaccine. He is requesting this today if he is able to.  Patient CPAP compliance report is listed below:  02/16/2020-03/16/2020-CPAP compliance-30 had a last 30 days use, all 30 those days greater than 4 hours, average usage 8 hours and 59 minutes, CPAP set pressure 14, AHI 1  Patient reporting that overall his breathing has been stable.  Unfortunately he did develop some GI symptoms/severe diarrhea about 3 weeks ago.  He tried to increase the protein in his diet to try to help with tolerating  Ofev.  He was already using Imodium for management of his chronic diarrhea that he has from [facility] traditionally.  This is significantly worsened with abdominal pain.  Patient started taking Ofev daily about 2 weeks ago and symptoms have resolved.  He feels that he is back to his baseline.  He would like to discuss this today.  Questionaires / Pulmonary Flowsheets:   ACT:  No flowsheet data found.  MMRC: mMRC Dyspnea Scale mMRC Score  03/18/2020 2  10/07/2019 1  09/11/2019 1    Epworth:  No flowsheet data found.  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 Fibrotic changes again noted.   HRCT CHEST W/O 05/2016  minimal subpleural patchy groundglass. Traction bronchiectasis with areas of honeycombing and subpleural as well as central distribution of reticulation with intralobular septal thickening. There is aslight craniocaudal  progression suggestive of a UIP pattern. Subcarinal and precarinal lymph nodes measuring up to 1.1 &1.2 cm in short axis. Patient does have an 1.1 CM oblong nodule within his left lower lobe that corresponds to previously noted left lower lobe nodule.   CT CHEST W/ 04/2016 :9 mm left lower lobe nodule. Diffuse interlobular septal thickening with architectural  distortion inferiorly with honeycombing versus bronchiectasis and medial right lower lobe. Fatty infiltration of the liver noted.  CT CHEST W/ 07/2007:. Irregular interstitial prominence in addition. Ill-defined cystic lucencies diffusely. Fracture of left 10th rib noted.   FENO:  No results found for: NITRICOXIDE  PFT: PFT Results Latest Ref Rng & Units 03/12/2020 09/11/2019 01/10/2019 06/13/2017 01/06/2017 09/29/2016 06/24/2016  FVC-Pre L 2.04 2.08 2.22 2.41 2.29 2.31 2.49  FVC-Predicted Pre % 64 65 66 71 67 67 76  FVC-Post L - 2.21 2.36 - - - 2.46  FVC-Predicted Post % - 69 70 - - - 75  Pre FEV1/FVC % % 85 86 86 88 88 87 89  Post FEV1/FCV % % - 85 84 - - - 88  FEV1-Pre L 1.75 1.79 1.90 2.13 2.03 2.00 2.21  FEV1-Predicted Pre % 76 77 77 85 81 79 91  FEV1-Post L - 1.88 1.99 - - - 2.16  DLCO uncorrected ml/min/mmHg 15.69 13.44 13.36 17.43 17.35 15.83 14.29  DLCO UNC% % 78 67 64 76 75 69 66  DLCO corrected ml/min/mmHg 15.69 - - 16.53 17.30 15.11 -  DLCO COR %Predicted % 78 - - 72 75 65 -  DLVA Predicted % 107 97 97 108 119 104 105  TLC L - 3.48 4.06 - - - 3.77  TLC % Predicted % - 64 72 - - - 69  RV % Predicted % - 46 83 - - - 53    WALK:  SIX MIN WALK 10/07/2019 09/11/2019 07/06/2018 01/06/2017 09/29/2016 06/24/2016  Medications No meds taken - - pt stated that he took all of his meds this morning around 7 am lipitor 40mg , xyrtec 10mg , plavix 75mg , fenofibrate 160mg , cozarr 100mg , toprol-xl 25mg , zantac 75mg  & effexor-xr 75 all taken at 7:15am Lipitor 40mg , Zyrtec 10mg , Plavix 75mg , Fenofibrate 160mg , Cozaar 100mg , Toprol-XL 25mg , Zantac 75mg  and Effexor-XR 75mg  ---ALL at 830am  Supplimental Oxygen during Test? (L/min) No - No No No No  Laps 12 - - 8 7 9   Partial Lap (in Meters) 22 - - 28 18 20   Baseline BP (sitting) 130/80 - - 136/80 136/74 128/70  Baseline Heartrate 82 - - 99 74 113  Baseline Dyspnea (Borg Scale) 0 - - 0 0 0  Baseline Fatigue (Borg Scale) 0 - - 0 0 0  Baseline SPO2 95 - -  99 99 97  BP (sitting) 132/70 - - 174/92 150/80 164/70  Heartrate 116 - - 141 121 146  Dyspnea (Borg Scale) 1 - - 0.5 0 1  Fatigue (Borg Scale) 2 - - 2 0 1  SPO2 90 - - 90 91 92  BP (sitting) 132/82 - - 144/82 146/78 152/76  Heartrate 94 - - 103 91 125  SPO2 96 - - 100 99 100  Stopped or Paused before Six Minutes No - - No No No  Interpretation Hip pain - - - - -  Distance Completed 430 - - 412 354 452  Distance Completed 22 - - - - -  Tech Comments: Pt walked at an average pace without stopping. Pt had complaints of hip pain which is normal with walking.  Sats were 88% on RA when walk ended but went up to 90% after 2sec. pt walked steady pace O2 dropped 500 yards of walking\sb moderate walking pace - completed with mild SOB at end of walk.  SpO2 remained 90% and above.  Karlton Lemonenise Buckner RN pt stated that he did have some sligh chest pressure but that is normal for him.  completed test without difficulty test preformed with forehead probe. pt completed test with no desats or complaints.  pt walked a fast pace during the walk test, no desat. Pt c/o leg tightness at the end of the test - no pain    Imaging: No results found.  Lab Results:  CBC    Component Value Date/Time   WBC 5.3 11/04/2008 0605   RBC 4.33 11/04/2008 0605   HGB 13.8 11/04/2008 0605   HCT 38.4 (L) 11/04/2008 0605   PLT 182 11/04/2008 0605   MCV 88.7 11/04/2008 0605   MCHC 36.0 11/04/2008 0605   RDW 12.5 11/04/2008 0605   LYMPHSABS 2.4 10/31/2008 1547   MONOABS 0.5 10/31/2008 1547   EOSABS 0.1 10/31/2008 1547   BASOSABS 0.0 10/31/2008 1547    BMET    Component Value Date/Time   NA 138 03/13/2020 1058   K 4.8 03/13/2020 1058   CL 100 03/13/2020 1058   CO2 24 03/13/2020 1058   GLUCOSE 108 (H) 03/13/2020 1058   GLUCOSE 88 12/02/2009 0000   BUN 22 03/13/2020 1058   CREATININE 1.39 (H) 03/13/2020 1058   CALCIUM 9.6 03/13/2020 1058   GFRNONAA 51 (L) 03/13/2020 1058   GFRAA 59 (L) 03/13/2020 1058    BNP No  results found for: BNP  ProBNP No results found for: PROBNP  Specialty Problems      Pulmonary Problems   Chronic seasonal allergic rhinitis   Lung nodule < 6cm on CT    9mm in left lower lobe      ILD (interstitial lung disease) (HCC)    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      OSA (obstructive sleep apnea)    CPAP 14         No Known Allergies  Immunization History  Administered Date(s) Administered   Fluad Quad(high Dose 65+) 01/10/2019   Influenza, High Dose Seasonal PF 02/13/2018   Influenza,inj,Quad PF,6+ Mos 05/27/2016, 01/06/2017   PFIZER SARS-COV-2 Vaccination 06/24/2019, 07/20/2019, 02/13/2020   Pneumococcal Polysaccharide-23 06/24/2016    Past Medical History:  Diagnosis Date   CAD (coronary artery disease)    Depression    Diabetes mellitus (HCC)    Borderline   Encounter for screening for malignant neoplasm of prostate    HTN (hypertension)    Hyperlipidemia    Non morbid obesity due to excess calories    Renal insufficiency    Rhinitis, allergic     Tobacco History: Social History   Tobacco Use  Smoking Status Former Smoker   Packs/day: 1.00   Years: 43.00   Pack years: 43.00   Start date: 411960   Quit date: 05/16/2001   Years since quitting: 18.8  Smokeless Tobacco Never Used   Counseling given: Not Answered   Continue to not smoke  Outpatient Encounter Medications as of 03/18/2020  Medication Sig   amLODipine (NORVASC) 5 MG tablet Take 1 tablet (5 mg total) by mouth daily.   atorvastatin (LIPITOR) 40 MG tablet Take 40 mg by mouth daily at 6 PM.    cetirizine (ZYRTEC) 10 MG tablet Take 10 mg by mouth daily.  clopidogrel (PLAVIX) 75 MG tablet Take 75 mg by mouth daily.   fenofibrate 160 MG tablet Take 160 mg by mouth daily.    losartan (COZAAR) 100 MG tablet Take 50 mg by mouth daily.   metFORMIN (GLUCOPHAGE) 1000 MG tablet Take 500 mg by mouth daily with breakfast.    metoprolol succinate (TOPROL XL) 25 MG 24 hr  tablet Take 1 tablet (25 mg total) by mouth daily.   Nintedanib (OFEV) 150 MG CAPS Take 1 capsule (150 mg total) by mouth 2 (two) times daily.   Omega-3 Fatty Acids (FISH OIL) 1000 MG CAPS Take 1,000 mg by mouth daily.   ranitidine (ZANTAC) 75 MG tablet Take 75 mg by mouth 2 (two) times daily.   venlafaxine XR (EFFEXOR-XR) 75 MG 24 hr capsule Take 75 mg by mouth daily with breakfast.    No facility-administered encounter medications on file as of 03/18/2020.     Review of Systems  Review of Systems  Constitutional: Negative for activity change, chills, fatigue, fever and unexpected weight change.  HENT: Negative for postnasal drip, rhinorrhea, sinus pressure, sinus pain and sore throat.   Eyes: Negative.   Respiratory: Positive for shortness of breath. Negative for cough and wheezing.   Cardiovascular: Negative for chest pain and palpitations.  Gastrointestinal: Positive for diarrhea. Negative for constipation, nausea and vomiting.  Endocrine: Negative.   Genitourinary: Negative.   Musculoskeletal: Negative.   Skin: Negative.   Neurological: Negative for dizziness and headaches.  Psychiatric/Behavioral: Negative.  Negative for dysphoric mood. The patient is not nervous/anxious.   All other systems reviewed and are negative.    Physical Exam  BP 134/80 (BP Location: Left Arm, Cuff Size: Large)    Pulse 91    Temp (!) 97.3 F (36.3 C) (Other (Comment)) Comment (Src): wrist   Ht 5\' 2"  (1.575 m)    Wt 212 lb 3.2 oz (96.3 kg)    SpO2 93%    BMI 38.81 kg/m   Wt Readings from Last 5 Encounters:  03/18/20 212 lb 3.2 oz (96.3 kg)  03/11/20 212 lb 3.2 oz (96.3 kg)  10/07/19 205 lb 12.8 oz (93.4 kg)  09/11/19 210 lb 12.8 oz (95.6 kg)  09/10/19 215 lb (97.5 kg)    BMI Readings from Last 5 Encounters:  03/18/20 38.81 kg/m  03/11/20 38.81 kg/m  10/07/19 37.64 kg/m  09/11/19 38.56 kg/m  09/10/19 38.09 kg/m     Physical Exam Vitals and nursing note reviewed.   Constitutional:      General: He is not in acute distress.    Appearance: Normal appearance. He is obese.  HENT:     Head: Normocephalic and atraumatic.     Right Ear: Hearing and external ear normal.     Left Ear: Hearing and external ear normal.     Ears:     Comments: Deferred due to masking requirement    Nose: No mucosal edema.     Right Turbinates: Not enlarged.     Left Turbinates: Not enlarged.     Mouth/Throat:     Comments: Deferred due to masking requirement Eyes:     Pupils: Pupils are equal, round, and reactive to light.  Cardiovascular:     Rate and Rhythm: Normal rate and regular rhythm.     Pulses: Normal pulses.     Heart sounds: Normal heart sounds. No murmur heard.   Pulmonary:     Effort: Pulmonary effort is normal.     Breath sounds: Rales (Bibasilar crackles,  right greater than left) present. No decreased breath sounds or wheezing.  Abdominal:     General: Bowel sounds are normal. There is no distension.     Palpations: Abdomen is soft.     Tenderness: There is no abdominal tenderness.     Comments: Obese  Musculoskeletal:     Cervical back: Normal range of motion.     Right lower leg: No edema.     Left lower leg: No edema.  Lymphadenopathy:     Cervical: No cervical adenopathy.  Skin:    General: Skin is warm and dry.     Capillary Refill: Capillary refill takes less than 2 seconds.     Findings: No erythema or rash.  Neurological:     General: No focal deficit present.     Mental Status: He is alert and oriented to person, place, and time.     Motor: No weakness.     Coordination: Coordination normal.     Gait: Gait is intact. Gait (Tolerated walk in office) normal.  Psychiatric:        Mood and Affect: Mood normal.        Behavior: Behavior normal. Behavior is cooperative.        Thought Content: Thought content normal.        Judgment: Judgment normal.       Assessment & Plan:   OSA (obstructive sleep apnea) Plan: Continue CPAP  therapy   ILD (interstitial lung disease) (HCC) Reviewed pulmonary function testing Stability seen on PFTs Recent flare of GI symptoms while taking Ofev, symptoms improved from patient decreased Ofev dose 250 mg daily  Plan: Spirometry with DLCO in 6 months Lab work today 68-month follow-up with Dr. Vassie Loll I have instructed the patient to take a drug holiday from [facility] for the next 7 days, then goal will be to resume back to full dosing If patient continues to have persisting symptoms of GI upset despite these above interventions may need to consider either subtherapeutic dosing of Ofev or transitioning to Esbriet     Therapeutic drug monitoring Plan: Lab work today  Healthcare maintenance Patient is interested in receiving his pneumonia vaccine We discussed that he is up-to-date with his Pneumovax 23, he can receive the Prevnar 13 He would like to proceed forward with receiving the Prevnar 13 over the coming months as he does have chronic lung disease  Plan: High-dose seasonal flu vaccine today Instructed patient he can receive the Prevnar 13 at next follow-up visit with our team or at a commercial pharmacy or primary care before then    Return in about 2 months (around 05/18/2020), or if symptoms worsen or fail to improve, for Follow up with Dr. Vassie Loll.   Coral Ceo, NP 03/18/2020   This appointment required 35 minutes of patient care (this includes precharting, chart review, review of results, face-to-face care, etc.).

## 2020-03-18 NOTE — Assessment & Plan Note (Signed)
Plan: Continue CPAP therapy 

## 2020-03-18 NOTE — Addendum Note (Signed)
Addended by: Demetrio Lapping E on: 03/18/2020 09:34 AM   Modules accepted: Orders

## 2020-03-18 NOTE — Assessment & Plan Note (Signed)
Patient is interested in receiving his pneumonia vaccine We discussed that he is up-to-date with his Pneumovax 23, he can receive the Prevnar 13 He would like to proceed forward with receiving the Prevnar 13 over the coming months as he does have chronic lung disease  Plan: High-dose seasonal flu vaccine today Instructed patient he can receive the Prevnar 13 at next follow-up visit with our team or at a commercial pharmacy or primary care before then

## 2020-03-18 NOTE — Patient Instructions (Signed)
You were seen today by Lauraine Rinne, NP  for:   1. ILD (interstitial lung disease) ([place]) 2. Therapeutic drug monitoring  - Comp Met (CMET); Future - Hepatic function panel; Future - Pulmonary function test; Future  Walk today in office  We will repeat a spirometry with DLCO in 6 months  Lab work today  I would like for you to take a drug holiday from your Ofev given your current symptoms.  You can resume taking Ofev on 03/26/2020  When resuming Ofev on 03/26/2020 please take 150 mg daily for a total of 3 days  On 03/29/2020 please resume taking Ofev 150 mg taken every 12 hours  Notify our office if you have a return of severe abdominal pain/diarrhea  3. OSA (obstructive sleep apnea)  We recommend that you continue using your CPAP daily >>>Keep up the hard work using your device >>> Goal should be wearing this for the entire night that you are sleeping, at least 4 to 6 hours  Remember:  . Do not drive or operate heavy machinery if tired or drowsy.  . Please notify the supply company and office if you are unable to use your device regularly due to missing supplies or machine being broken.  . Work on maintaining a healthy weight and following your recommended nutrition plan  . Maintain proper daily exercise and movement  . Maintaining proper use of your device can also help improve management of other chronic illnesses such as: Blood pressure, blood sugars, and weight management.   BiPAP/ CPAP Cleaning:  >>>Clean weekly, with Dawn soap, and bottle brush.  Set up to air dry. >>> Wipe mask out daily with wet wipe or towelette    4. Healthcare maintenance  High-dose seasonal flu vaccine today  You can also receive the Prevnar 13 which is a pneumonia vaccine as discussed today  You can either receive this at your primary care office, commercial pharmacy, or our office when you return  We recommend today:  Orders Placed This Encounter  Procedures  . Comp Met (CMET)     Standing Status:   Future    Standing Expiration Date:   03/18/2021  . Hepatic function panel    Standing Status:   Future    Standing Expiration Date:   03/18/2021  . Pulmonary function test    Standing Status:   Future    Standing Expiration Date:   03/18/2021    Scheduling Instructions:     6 months from now      [place] with dlco    Order Specific Question:   Where should this test be performed?    Answer:    Pulmonary   Orders Placed This Encounter  Procedures  . Comp Met (CMET)  . Hepatic function panel  . Pulmonary function test   No orders of the defined types were placed in this encounter.   Follow Up:    Return in about 2 months (around 05/18/2020), or if symptoms worsen or fail to improve, for Follow up with Dr. Elsworth Soho. Please also schedule patient for a 30-minute breathing test-spirometry with DLCO in 6 months  Notification of test results are managed in the following manner: If there are  any recommendations or changes to the  plan of care discussed in office today,  we will contact you and let you know what they are. If you do not hear from [place], then your results are normal and you can view them through your  MyChart account ,  or a letter will be sent to you. Thank you again for trusting [place] with your care  - Thank you, [facility]    It is flu season:   >>> Best ways to protect herself from the flu: Receive the yearly flu vaccine, practice good hand hygiene washing with soap and also using hand sanitizer when available, eat a nutritious meals, get adequate rest, hydrate appropriately       Please contact the office if your symptoms worsen or you have concerns that you are not improving.   Thank you for choosing [facility] for your healthcare, and for allowing [place] to partner with you on your healthcare journey. I am thankful to be able to provide care to you today.   Wyn Quaker FNP-C   Influenza Virus Vaccine injection What is this  medicine? INFLUENZA VIRUS VACCINE (in floo EN zuh VAHY ruhs vak SEEN) helps to reduce the risk of getting influenza also known as the flu. The vaccine only helps protect you against some strains of the flu. This medicine may be used for other purposes; ask your health care provider or pharmacist if you have questions. COMMON BRAND NAME(S): Afluria, Afluria Quadrivalent, Agriflu, Alfuria, FLUAD, Fluarix, Fluarix Quadrivalent, Flublok, Flublok Quadrivalent, FLUCELVAX, FLUCELVAX Quadrivalent, Flulaval, Flulaval Quadrivalent, Fluvirin, Fluzone, Fluzone High-Dose, Fluzone Intradermal, Fluzone Quadrivalent What should I tell my health care provider before I take this medicine? They need to know if you have any of these conditions:  bleeding disorder like hemophilia  fever or infection  Guillain-Barre syndrome or other neurological problems  immune system problems  infection with the human immunodeficiency virus (HIV) or AIDS  low blood platelet counts  multiple sclerosis  an unusual or allergic reaction to influenza virus vaccine, latex, other medicines, foods, dyes, or preservatives. Different brands of vaccines contain different allergens. Some may contain latex or eggs. Talk to your doctor about your allergies to make sure that you get the right vaccine.  pregnant or trying to get pregnant  breast-feeding How should I use this medicine? This vaccine is for injection into a muscle or under the skin. It is given by a health care professional. A copy of Vaccine Information Statements will be given before each vaccination. Read this sheet carefully each time. The sheet may change frequently. Talk to your healthcare provider to see which vaccines are right for you. Some vaccines should not be used in all age groups. Overdosage: If you think you have taken too much of this medicine contact a poison control center or emergency room at once. NOTE: This medicine is only for you. Do not share this  medicine with others. What if I miss a dose? This does not apply. What may interact with this medicine?  chemotherapy or radiation therapy  medicines that lower your immune system like etanercept, anakinra, infliximab, and adalimumab  medicines that treat or prevent blood clots like warfarin  phenytoin  steroid medicines like prednisone or cortisone  theophylline  vaccines This list may not describe all possible interactions. Give your health care provider a list of all the medicines, herbs, non-prescription drugs, or dietary supplements you use. Also tell them if you smoke, drink alcohol, or use illegal drugs. Some items may interact with your medicine. What should I watch for while using this medicine? Report any side effects that do not go away within 3 days to your doctor or health care professional. Call your health care provider if any unusual symptoms occur within 6 weeks of receiving  this vaccine. You may still catch the flu, but the illness is not usually as bad. You cannot get the flu from the vaccine. The vaccine will not protect against colds or other illnesses that may cause fever. The vaccine is needed every year. What side effects may I notice from receiving this medicine? Side effects that you should report to your doctor or health care professional as soon as possible:  allergic reactions like skin rash, itching or hives, swelling of the face, lips, or tongue Side effects that usually do not require medical attention (report to your doctor or health care professional if they continue or are bothersome):  fever  headache  muscle aches and pains  pain, tenderness, redness, or swelling at the injection site  tiredness This list may not describe all possible side effects. Call your doctor for medical advice about side effects. You may report side effects to FDA at 1-800-FDA-1088. Where should I keep my medicine? The vaccine will be given by a health care  professional in a clinic, pharmacy, doctor's office, or other health care setting. You will not be given vaccine doses to store at home. NOTE: This sheet is a summary. It may not cover all possible information. If you have questions about this medicine, talk to your doctor, pharmacist, or health care provider.  2020 Elsevier/Gold Standard (2018-03-27 08:45:43)

## 2020-03-18 NOTE — Assessment & Plan Note (Signed)
Plan: Lab work today 

## 2020-03-18 NOTE — Assessment & Plan Note (Signed)
Reviewed pulmonary function testing Stability seen on PFTs Recent flare of GI symptoms while taking Ofev, symptoms improved from patient decreased Ofev dose 250 mg daily  Plan: Spirometry with DLCO in 6 months Lab work today 85-month follow-up with Dr. Vassie Loll I have instructed the patient to take a drug holiday from [facility] for the next 7 days, then goal will be to resume back to full dosing If patient continues to have persisting symptoms of GI upset despite these above interventions may need to consider either subtherapeutic dosing of Ofev or transitioning to [employer]

## 2020-06-18 ENCOUNTER — Encounter: Payer: Self-pay | Admitting: Acute Care

## 2020-06-22 ENCOUNTER — Other Ambulatory Visit: Payer: Self-pay | Admitting: *Deleted

## 2020-06-22 ENCOUNTER — Ambulatory Visit: Payer: Medicare Other | Admitting: Adult Health

## 2020-06-22 ENCOUNTER — Ambulatory Visit: Payer: Medicare Other | Admitting: Acute Care

## 2020-06-22 ENCOUNTER — Encounter: Payer: Self-pay | Admitting: Adult Health

## 2020-06-22 ENCOUNTER — Other Ambulatory Visit: Payer: Self-pay

## 2020-06-22 ENCOUNTER — Ambulatory Visit (INDEPENDENT_AMBULATORY_CARE_PROVIDER_SITE_OTHER): Payer: Medicare Other

## 2020-06-22 VITALS — BP 130/70 | HR 94 | Temp 97.3°F | Ht 62.0 in | Wt 210.0 lb

## 2020-06-22 DIAGNOSIS — J849 Interstitial pulmonary disease, unspecified: Secondary | ICD-10-CM

## 2020-06-22 DIAGNOSIS — G4733 Obstructive sleep apnea (adult) (pediatric): Secondary | ICD-10-CM | POA: Diagnosis not present

## 2020-06-22 DIAGNOSIS — J9611 Chronic respiratory failure with hypoxia: Secondary | ICD-10-CM | POA: Insufficient documentation

## 2020-06-22 MED ORDER — PREDNISONE 20 MG PO TABS: 20.0000 mg | ORAL_TABLET | Freq: Every day | ORAL | 0 refills | Status: DC

## 2020-06-22 MED ORDER — AMOXICILLIN-POT CLAVULANATE 875-125 MG PO TABS: 1.0000 | ORAL_TABLET | Freq: Two times a day (BID) | ORAL | 0 refills | Status: DC

## 2020-06-22 NOTE — Progress Notes (Signed)
Called and spoke with the patient's son, JABIR DAHLEM, listed on the [place], advised of the results/recommendations per Rubye Oaks NP.  Augmentin sent to pharmacy.  Nothing further needed.

## 2020-06-22 NOTE — Patient Instructions (Addendum)
Begin Prednisone 20mg  daily for 5 days , take with food.  Delsym 2 tsp Twice daily  As needed  Cough  Begin Oxygen 2l/m with activity , goal is to keep O2 sats >88-90%.  Order for POC .  Continue on OFEV.  Chest xray today  Follow up with Dr.  In 4-6 weeks and As needed   Please contact office for sooner follow up if symptoms do not improve or worsen or seek emergency care

## 2020-06-22 NOTE — Progress Notes (Signed)
@Patient  ID: , male    DOB: 04/04/1951, 70 y.o.   MRN: [id number]  Chief Complaint  Patient presents with  . Follow-up    Referring provider: [id number], FNP  HPI: 70 year old male former smoker followed for obstructive sleep apnea and idiopathic pulmonary fibrosis Daughter is a respiratory therapist at [employer] Wife got severe COVID early 2020 -long haul sx.   TEST/EVENTS :  ILD work-up-high-resolution CT chest diagnosed ILD. PFT showed mild restriction Autoimmune extensive work-up was negative Patient has declined anti-fibrotic therapies  PFT 1/2019ratio 88, FVC 71%, DLCO 76%  01/06/17:FVC 2.29 L (67%) FEV1 2.03 L (81%) FEV1/FVC 0.88 DLCO corrected 75%  06/24/16: FVC 2.49 L (76%) FEV1 2.21 L (91%) FEV1/FVC 0.89 FEF 25-75 3.41 L (177%) negative bronchodilator response TLC 3.77 L (69%) RV 53% ERV 130% DLCO uncorrected 66%   03/12/2020-pulmonary function test-spirometry with DLCO-FVC 2.04 (64% predicted), ratio 85, FEV1 1.75 (76% predicted), DLCO 15.69 (78% predicted)  03/14/2020 01/06/17:Walked 412 meters / Baseline Sat 99% on RA / Nadir Sat 90% on RA @ end of test 09/29/16: Walked 354 meters / Baseline Sat 99% on RA / Nadir Sat 91% on RA @ end of test 06/24/16: Walked 452 meters / Baseline Sat 97% on RA / Nadir Sat 92% on RA @ end of test  PSG(06/07/16): AHI 15.0 events/hour., nadir satn78%>>Optimal CPAP pressure 14 cm   IMAGING  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 Fibrotic changes again noted.   HRCT CHEST W/O 05/2016 Patient does have minimal subpleural patchy groundglass. Traction bronchiectasis with areas of honeycombing and subpleural as well as central distribution of reticulation with intralobular septal thickening. There is  aslight craniocaudal progression suggestive of a UIP pattern. Subcarinal and precarinal lymph nodes measuring up to 1.1 &1.2 cm in short axis. Patient does have an 1.1 CM oblong nodule within his left lower lobe that corresponds to previously noted left lower lobe nodule.   CT CHEST W/ 04/20/16 (per radiologist):9 mm left lower lobe nodule. Diffuse interlobular septal thickening with architectural distortion inferiorly with honeycombing versus bronchiectasis and medial right lower lobe. No pleural effusion or calcification. No cardiomegaly or pericardial effusion. No lymphadenopathy or mass with a mediastinum. Fatty infiltration of the liver noted.  CT CHEST W/ 3/6/9 (per radiologist):01-19-1990 Irregular interstitial prominence in addition. Ill-defined cystic lucencies diffusely. Fracture of left 10th rib noted.  06/22/2020 Follow up : IPF and OSA  Patient returns for a follow-up visit for IPF.  He remains on Ofev 150 mg twice daily.  Patient says he has noticed over the last 2  months decreased activity tolerance and drop in his oxygen levels with ambulation. Noticed increased cough with yellow mucus for last 4 weeks. O2 sats at home drop to mid 70s , recovers with rest . Says he is very active home, works in yard, 08/20/2020 jobs.  Today in the office O2 saturations dropped to 86-88 % on RA , 2l/m maintained O2 sats >88-90%.  Patient denies any leg swelling , hemoptysis, chest pain. Appetite is good. No chest pain or palpitations.  Pulmonary function testing done October 2021 showed stable pulmonary function Remains on OFEV.   Patient has underlying obstructive sleep apnea.  Is on nocturnal CPAP.  Patient has excellent compliance and control.  CPAP download shows 100%  compliance daily average usage at 10 hours.  Patient is on CPAP 14 cm H2O.  AHI 0.6.  Patient says he is doing well CPAP feels rested and feels that he benefits from CPAP.    No Known Allergies  Immunization History  Administered  Date(s) Administered  . Fluad Quad(high Dose 65+) 01/10/2019, 03/18/2020  . Influenza, High Dose Seasonal PF 02/13/2018  . Influenza,inj,Quad PF,6+ Mos 05/27/2016, 01/06/2017  . PFIZER(Purple Top)SARS-COV-2 Vaccination 06/24/2019, 07/20/2019, 02/13/2020  . Pneumococcal Polysaccharide-23 06/24/2016    Past Medical History:  Diagnosis Date  . CAD (coronary artery disease)   . Depression   . Diabetes mellitus (HCC)    Borderline  . Encounter for screening for malignant neoplasm of prostate   . HTN (hypertension)   . Hyperlipidemia   . Non morbid obesity due to excess calories   . Renal insufficiency   . Rhinitis, allergic     Tobacco History: Social History   Tobacco Use  Smoking Status Former Smoker  . Packs/day: 1.00  . Years: 43.00  . Pack years: 43.00  . Start date: 65  . Quit date: 05/16/2001  . Years since quitting: 19.1  Smokeless Tobacco Never Used   Counseling given: Not Answered   Outpatient Medications Prior to Visit  Medication Sig Dispense Refill  . atorvastatin (LIPITOR) 40 MG tablet Take 40 mg by mouth daily at 6 PM.     . cetirizine (ZYRTEC) 10 MG tablet Take 10 mg by mouth daily.     . clopidogrel (PLAVIX) 75 MG tablet Take 75 mg by mouth daily.    . fenofibrate 160 MG tablet Take 160 mg by mouth daily.     Marland Kitchen losartan (COZAAR) 100 MG tablet Take 50 mg by mouth daily.    . metFORMIN (GLUCOPHAGE) 1000 MG tablet Take 500 mg by mouth daily with breakfast.     . metoprolol succinate (TOPROL XL) 25 MG 24 hr tablet Take 1 tablet (25 mg total) by mouth daily. 90 tablet 3  . Nintedanib (OFEV) 150 MG CAPS Take 1 capsule (150 mg total) by mouth 2 (two) times daily. 120 capsule 5  . Omega-3 Fatty Acids (FISH OIL) 1000 MG CAPS Take 1,000 mg by mouth daily.    . ranitidine (ZANTAC) 75 MG tablet Take 75 mg by mouth 2 (two) times daily.    Marland Kitchen venlafaxine XR (EFFEXOR-XR) 75 MG 24 hr capsule Take 75 mg by mouth daily with breakfast.     . amLODipine (NORVASC) 5 MG tablet  Take 1 tablet (5 mg total) by mouth daily. 90 tablet 3   No facility-administered medications prior to visit.     Review of Systems:   Constitutional:   No  weight loss, night sweats,  Fevers, chills,  +fatigue, or  lassitude.  HEENT:   No headaches,  Difficulty swallowing,  Tooth/dental problems, or  Sore throat,                No sneezing, itching, ear ache, nasal congestion, post nasal drip,   CV:  No chest pain,  Orthopnea, PND, swelling in lower extremities, anasarca, dizziness, palpitations, syncope.   GI  No heartburn, indigestion, abdominal pain, nausea, vomiting, diarrhea, change in bowel habits, loss of appetite, bloody stools.   Resp:   No chest wall deformity  Skin: no rash or lesions.  GU: no dysuria, change in color of urine, no urgency or frequency.  No flank pain, no hematuria   MS:  No joint pain or swelling.  No decreased range of motion.  No back pain.    Physical Exam  BP 130/70 (BP Location: Left Arm, Patient Position: Sitting, Cuff Size: Normal)   Pulse 94   Temp (!) 97.3 F (36.3 C) (Temporal)   Ht 5\' 2"  (1.575 m)   Wt 210 lb (95.3 kg)   SpO2 92%   BMI 38.41 kg/m   GEN: A/Ox3; pleasant , NAD, elderly    HEENT:  [place]/AT,    NOSE-clear, THROAT-clear, no lesions, no postnasal drip or exudate noted.   NECK:  Supple w/ fair ROM; no JVD; normal carotid impulses w/o bruits; no thyromegaly or nodules palpated; no lymphadenopathy.    RESP  Bibasliar crackles  no accessory muscle use, no dullness to percussion  CARD:  RRR, no m/r/g, no peripheral edema, pulses intact, no cyanosis or clubbing.  GI:   Soft & nt; nml bowel sounds; no organomegaly or masses detected.   Musco: Warm bil, no deformities or joint swelling noted.   Neuro: alert, no focal deficits noted.    Skin: Warm, no lesions or rashes    Lab Results: Labs reviewed on care everywhere showed normal LFTs, H&H  CBC  BMET  BNP No results found for: BNP  ProBNP No results found  for: PROBNP  Imaging: No results found.    PFT Results Latest Ref Rng & Units 03/12/2020 09/11/2019 01/10/2019 06/13/2017 01/06/2017 09/29/2016 06/24/2016  FVC-Pre L 2.04 2.08 2.22 2.41 2.29 2.31 2.49  FVC-Predicted Pre % 64 65 66 71 67 67 76  FVC-Post L - 2.21 2.36 - - - 2.46  FVC-Predicted Post % - 69 70 - - - 75  Pre FEV1/FVC % % 85 86 86 88 88 87 89  Post FEV1/FCV % % - 85 84 - - - 88  FEV1-Pre L 1.75 1.79 1.90 2.13 2.03 2.00 2.21  FEV1-Predicted Pre % 76 77 77 85 81 79 91  FEV1-Post L - 1.88 1.99 - - - 2.16  DLCO uncorrected ml/min/mmHg 15.69 13.44 13.36 17.43 17.35 15.83 14.29  DLCO UNC% % 78 67 64 76 75 69 66  DLCO corrected ml/min/mmHg 15.69 - - 16.53 17.30 15.11 -  DLCO COR %Predicted % 78 - - 72 75 65 -  DLVA Predicted % 107 97 97 108 119 104 105  TLC L - 3.48 4.06 - - - 3.77  TLC % Predicted % - 64 72 - - - 69  RV % Predicted % - 46 83 - - - 53    No results found for: NITRICOXIDE      Assessment & Plan:   ILD (interstitial lung disease) (HCC) Interstitial lung disease suspect may have progressive decline.  We will give a short course of empiric steroids only at low-dose.  Check chest x-ray.  Pending those results consider a repeat high-resolution CT chest.  Pulmonary function testing October 2021 showed stable lung function and DLCO. Patient now with exertional desaturations.  Will need to begin oxygen. Continue on Ofev.  Plan  Patient Instructions  Begin Prednisone 20mg  daily for 5 days , take with food.  Delsym 2 tsp Twice daily  As needed  Cough  Begin Oxygen 2l/m with activity , goal is to keep O2 sats >88-90%.  Order for POC .  Continue on OFEV.  Chest xray today  Follow up with Dr. Vassie Loll  In 4-6 weeks and As needed   Please contact office for sooner follow up if symptoms do not improve or worsen or seek emergency care  OSA (obstructive sleep apnea) Excellent control and compliance on nocturnal CPAP.  No changes  Chronic hypoxemic  respiratory failure [facility])  Patient now has exertional hypoxemia.  Suspect is related to his underlying ILD.  Will check chest x-ray today.  Empiric course of steroids.  Pending chest x-ray results consider high-resolution CT chest.  Recent lab work showed normal H&H.  Plan  Patient Instructions  Begin Prednisone 20mg  daily for 5 days , take with food.  Delsym 2 tsp Twice daily  As needed  Cough  Begin Oxygen 2l/m with activity , goal is to keep O2 sats >88-90%.  Order for POC .  Continue on OFEV.  Chest xray today  Follow up with Dr.  In 4-6 weeks and As needed   Please contact office for sooner follow up if symptoms do not improve or worsen or seek emergency care             Vassie Loll, NP 06/22/2020

## 2020-06-22 NOTE — Assessment & Plan Note (Signed)
Excellent control and compliance on nocturnal CPAP.  No changes 

## 2020-06-22 NOTE — Assessment & Plan Note (Signed)
Interstitial lung disease suspect may have progressive decline.  We will give a short course of empiric steroids only at low-dose.  Check chest x-ray.  Pending those results consider a repeat high-resolution CT chest.  Pulmonary function testing October 2021 showed stable lung function and DLCO. Patient now with exertional desaturations.  Will need to begin oxygen. Continue on Ofev.  Plan  Patient Instructions  Begin Prednisone 20mg  daily for 5 days , take with food.  Delsym 2 tsp Twice daily  As needed  Cough  Begin Oxygen 2l/m with activity , goal is to keep O2 sats >88-90%.  Order for POC .  Continue on OFEV.  Chest xray today  Follow up with Dr.  In 4-6 weeks and As needed   Please contact office for sooner follow up if symptoms do not improve or worsen or seek emergency care

## 2020-06-22 NOTE — Assessment & Plan Note (Signed)
  Patient now has exertional hypoxemia.  Suspect is related to his underlying ILD.  Will check chest x-ray today.  Empiric course of steroids.  Pending chest x-ray results consider high-resolution CT chest.  Recent lab work showed normal H&H.  Plan  Patient Instructions  Begin Prednisone 20mg  daily for 5 days , take with food.  Delsym 2 tsp Twice daily  As needed  Cough  Begin Oxygen 2l/m with activity , goal is to keep O2 sats >88-90%.  Order for POC .  Continue on OFEV.  Chest xray today  Follow up with Dr.  In 4-6 weeks and As needed   Please contact office for sooner follow up if symptoms do not improve or worsen or seek emergency care

## 2020-07-06 NOTE — Telephone Encounter (Signed)
Received [employer] application for Ofev. Patient would like his portion of application mailed. Confirmed mailing address and advised it may take 7-10 days to receive.  Will place provider portion in Dr. Reginia Naas mailbox to be signed  Chesley Mires, PharmD, MPH Clinical Pharmacist (Rheumatology and Pulmonology)

## 2020-07-09 ENCOUNTER — Other Ambulatory Visit: Payer: Self-pay

## 2020-07-09 ENCOUNTER — Ambulatory Visit
Admission: RE | Admit: 2020-07-09 | Discharge: 2020-07-09 | Disposition: A | Discharge status: Home / Self Care | Payer: Medicare Other | Source: Ambulatory Visit | Attending: Adult Health | Admitting: Adult Health

## 2020-07-09 DIAGNOSIS — J849 Interstitial pulmonary disease, unspecified: Secondary | ICD-10-CM

## 2020-07-15 NOTE — Telephone Encounter (Signed)
Called and spoke with patient's wife to see if they have mailed or dropped off patient assistance paperwork for OFEV. She stated she was going to have to ask her son who was on a conference call and she would call the office back.

## 2020-07-15 NOTE — Progress Notes (Signed)
Called and spoke with patient, advised of results/recommendations per Tammy Parrett NP.  He verbalized understanding.  Nothing further needed.

## 2020-07-17 NOTE — Telephone Encounter (Signed)
Called patient and got how many people in household of 2 patient and wife and physical address. Paperwork has been given to [employer]. Will route to [facility] as FYI as I think she has Dr. Carlena Sax half.

## 2020-07-17 NOTE — Telephone Encounter (Signed)
Patient portion received by pharmacy team, still awaiting provider portion. Will place another copy in provider's box just in case.

## 2020-07-22 ENCOUNTER — Other Ambulatory Visit: Payer: Self-pay | Admitting: *Deleted

## 2020-07-22 MED ORDER — OFEV 150 MG PO CAPS: 150.0000 mg | ORAL_CAPSULE | Freq: Two times a day (BID) | ORAL | 5 refills | Status: DC

## 2020-07-23 ENCOUNTER — Encounter: Payer: Self-pay | Admitting: Pulmonary Disease

## 2020-07-23 ENCOUNTER — Ambulatory Visit: Payer: Medicare Other | Admitting: Pulmonary Disease

## 2020-07-23 ENCOUNTER — Other Ambulatory Visit: Payer: Self-pay

## 2020-07-23 VITALS — BP 122/64 | HR 94 | Temp 98.1°F | Ht 63.0 in | Wt 212.8 lb

## 2020-07-23 DIAGNOSIS — J849 Interstitial pulmonary disease, unspecified: Secondary | ICD-10-CM

## 2020-07-23 DIAGNOSIS — J9611 Chronic respiratory failure with hypoxia: Secondary | ICD-10-CM | POA: Diagnosis not present

## 2020-07-23 DIAGNOSIS — Z5181 Encounter for therapeutic drug level monitoring: Secondary | ICD-10-CM

## 2020-07-23 LAB — HEPATIC FUNCTION PANEL
ALT: 27 U/L (ref 0–53)
AST: 21 U/L (ref 0–37)
Albumin: 4.2 g/dL (ref 3.5–5.2)
Alkaline Phosphatase: 101 U/L (ref 39–117)
Bilirubin, Direct: 0.1 mg/dL (ref 0.0–0.3)
Total Bilirubin: 0.4 mg/dL (ref 0.2–1.2)
Total Protein: 7.4 g/dL (ref 6.0–8.3)

## 2020-07-23 NOTE — Assessment & Plan Note (Signed)
  Okay to get portable concentrator from [employer], please have them send [place] request

## 2020-07-23 NOTE — Assessment & Plan Note (Signed)
CPAP download was reviewed which shows excellent compliance, no missed nights, pressure 14 cm in noticeable events and minimal leak  Weight loss encouraged, compliance with goal of at least 4-6 hrs every night is the expectation. Advised against medications with sedative side effects Cautioned against driving when sleepy - understanding that sleepiness will vary on a day to day basis

## 2020-07-23 NOTE — Progress Notes (Signed)
Called and went over lab results per Dr Alva with patient. All questions answered and patient expressed full understanding. Nothing further needed at this time.

## 2020-07-23 NOTE — Patient Instructions (Addendum)
  Mild progression on CT scan. Lung function from October appears stable.  Glad you are tolerating Ofev better. Check LFTs  Okay to get portable concentrator from [employer], please have them send [place] request

## 2020-07-23 NOTE — Assessment & Plan Note (Signed)
Mild progression on CT scan-overall very mild progression since 2009.  Still believe this is IPF with slow progression Lung function from October appears stable.  Glad you are tolerating Ofev better. Check LFTs

## 2020-07-23 NOTE — Progress Notes (Signed)
   Subjective:    Patient ID: Gary Keller, male    DOB: Oct 29, 1950, 69 y.o.   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  Accompanied by his daughter Corrie Dandy who is a respiratory therapist, they were unable to obtain POC from DME and they would like to get this from [employer]. Pain is improved with oxygen. He is tolerating Ofev well, still has loose stools 2-3 times daily but does not have to use Lomotil  LFTs were normal 03/2020  Significant tests/ events reviewed  PFT 02/2020 >> stable, FVC 64% DLCO 78%  12/2018 >>FVC 66%, TLC 72%, DLCO 64% 02/2018 FVC 66%  1/2019ratio 88, FVC 71%, DLCO 76%  01/06/17:FVC 2.29 L (67%) FEV1 2.03 L (81%) FEV1/FVC 0.88 DLCO corrected 75%  06/24/16: FVC 2.49 L (76%) FEV1 2.21 L (91%) FEV1/FVC 0.89 negative bronchodilator response TLC 3.77 L (69%) DLCO uncorrected 66%  01/06/17:Walked 412 meters / Baseline Sat 99% on RA / Nadir Sat 90% on RA @ end of test   PSG(06/07/16): AHI 15.0 events/hour., nadir satn78%>>Optimal CPAP pressure 14 cm    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 Fibrotic changes again noted.   HRCT CHEST W/O 05/2016  minimal subpleural patchy groundglass. Traction bronchiectasis with areas of honeycombing and subpleural as well as central distribution of reticulation with intralobular septal thickening. There is aslight craniocaudal progression suggestive of a UIP pattern. Subcarinal and precarinal lymph nodes  measuring up to 1.1 &1.2 cm in short axis. Patient does have an 1.1 CM oblong nodule within his left lower lobe that corresponds to previously noted left lower lobe nodule.   CT CHEST W/ 04/2016 :9 mm left lower lobe nodule. Diffuse interlobular septal thickening with architectural distortion inferiorly with honeycombing versus bronchiectasis and medial right lower lobe. Fatty infiltration of the liver noted.  CT CHEST W/ 07/2007:. Irregular interstitial prominence in addition. Ill-defined cystic lucencies diffusely. Fracture of left 10th rib noted.  Review of Systems neg for any significant sore throat, dysphagia, itching, sneezing, nasal congestion or excess/ purulent secretions, fever, chills, sweats, unintended wt loss, pleuritic or exertional cp, hempoptysis, orthopnea pnd or change in chronic leg swelling. Also denies presyncope, palpitations, heartburn, abdominal pain, nausea, vomiting, diarrhea or change in bowel or urinary habits, dysuria,hematuria, rash, arthralgias, visual complaints, headache, numbness weakness or ataxia.      Objective:   Physical Exam  Gen. Pleasant, obese, in no distress, normal affect ENT - no pallor,icterus, no post nasal drip, class 2-3 airway Neck: No JVD, no thyromegaly, no carotid bruits Lungs: no use of accessory muscles, no dullness to percussion,bibasal 1/2 rales no rhonchi  Cardiovascular: Rhythm regular, heart sounds  normal, no murmurs or gallops, no peripheral edema Abdomen: soft and non-tender, no hepatosplenomegaly, BS normal. Musculoskeletal: No deformities, no cyanosis or clubbing Neuro:  alert, non focal, no tremors        Assessment & Plan:

## 2020-07-24 NOTE — Telephone Encounter (Signed)
Faxed Ofev renewal appliation including signed patient portion, provider provider, income docs to [facility]  Fax: [phone] Phone: [phone]

## 2020-07-29 ENCOUNTER — Telehealth: Payer: Self-pay

## 2020-07-29 NOTE — Telephone Encounter (Signed)
Received fax from [employer] Patient Assistance Program has been approved and patient is eligible to receive medication (OFEV) through program from July 24, 2020 thru May 15, 2021.

## 2020-08-31 DIAGNOSIS — J9611 Chronic respiratory failure with hypoxia: Secondary | ICD-10-CM

## 2020-08-31 DIAGNOSIS — J849 Interstitial pulmonary disease, unspecified: Secondary | ICD-10-CM

## 2020-08-31 NOTE — Telephone Encounter (Signed)
OK for small POC

## 2020-08-31 NOTE — Telephone Encounter (Signed)
Dr. Vassie Loll, please see mychart message sent by pt and advise:  To: [facility] POOL    From: Gary Keller Sr [place]    Created: 08/31/2020 12:08 PM     *-*-*This message was handled on 08/31/2020 2:14 PM by Alekhya Gravlin P*-*-*  [facility] brought out the small oxygen tanks. I just got off the phone with them and they said they would need a prescription for him to get one of the battery operated small portable oxygen concentrators. They said if you could reach back out to their intake team they could see if they have any available for me to get one of those types. The tanks they brought out are hard to handle to get around outside.

## 2020-10-07 ENCOUNTER — Other Ambulatory Visit: Payer: Self-pay

## 2020-10-07 ENCOUNTER — Ambulatory Visit: Payer: Medicare Other | Attending: Internal Medicine

## 2020-10-07 DIAGNOSIS — Z20822 Contact with and (suspected) exposure to covid-19: Secondary | ICD-10-CM

## 2020-10-08 ENCOUNTER — Telehealth: Payer: Self-pay | Admitting: Pulmonary Disease

## 2020-10-08 LAB — NOVEL CORONAVIRUS, NAA: SARS-CoV-2, NAA: NOT DETECTED

## 2020-10-08 LAB — SARS-COV-2, NAA 2 DAY TAT

## 2020-10-08 MED ORDER — AMOXICILLIN-POT CLAVULANATE 875-125 MG PO TABS: 1.0000 | ORAL_TABLET | Freq: Two times a day (BID) | ORAL | 0 refills | Status: DC

## 2020-10-08 NOTE — Telephone Encounter (Signed)
Please send in prescription for Augmentin 875 twice daily for 7 days. If COVID test pcr is negative, we can offer him office visit with APP for chest x-ray

## 2020-10-08 NOTE — Telephone Encounter (Signed)
Called and spoke with patient's son Maisie Fus and his wife Claris Che. Per Claris Che, patient developed symptoms of a cough and wheezing on Sunday night. The cough has continued to get worse. The cough has been productive with small amounts of white phlegm. He has started to wheeze as well. He was able to check his temp last night was it was 101.38F.   He took an at home covid test on Monday but it came back negative. He took a PCR test yesterday and is currently awaiting the results.   He has taking OTC Coricidin.   His wife is concerned that he may have PNA.   Pharmacy is [facility] in [place].   RA, can you please advise. Thanks.

## 2020-10-08 NOTE — Telephone Encounter (Signed)
Called and spoke with patient's son. He verbalized understanding of RA's recs. Will go ahead and send in the Augmentin to [facility] in [place]. He is aware to let us know about the covid test results.   Nothing further needed at time of call.

## 2020-10-09 ENCOUNTER — Encounter: Payer: Self-pay | Admitting: Primary Care

## 2020-10-09 ENCOUNTER — Other Ambulatory Visit: Payer: Self-pay

## 2020-10-09 ENCOUNTER — Ambulatory Visit (INDEPENDENT_AMBULATORY_CARE_PROVIDER_SITE_OTHER): Payer: Medicare Other

## 2020-10-09 ENCOUNTER — Ambulatory Visit: Payer: Medicare Other | Admitting: Primary Care

## 2020-10-09 DIAGNOSIS — J209 Acute bronchitis, unspecified: Secondary | ICD-10-CM | POA: Diagnosis not present

## 2020-10-09 DIAGNOSIS — J9611 Chronic respiratory failure with hypoxia: Secondary | ICD-10-CM | POA: Diagnosis not present

## 2020-10-09 DIAGNOSIS — J849 Interstitial pulmonary disease, unspecified: Secondary | ICD-10-CM

## 2020-10-09 DIAGNOSIS — R059 Cough, unspecified: Secondary | ICD-10-CM | POA: Diagnosis not present

## 2020-10-09 MED ORDER — PREDNISONE 10 MG PO TABS: ORAL_TABLET | ORAL | 0 refills | Status: DC

## 2020-10-09 MED ORDER — ALBUTEROL SULFATE HFA 108 (90 BASE) MCG/ACT IN AERS: 2.0000 | INHALATION_SPRAY | Freq: Four times a day (QID) | RESPIRATORY_TRACT | 2 refills | Status: DC | PRN

## 2020-10-09 NOTE — Assessment & Plan Note (Addendum)
-   Using POC today, O2 94% 2L - No increase O2 demand

## 2020-10-09 NOTE — Assessment & Plan Note (Addendum)
Productive cough started 5 day ago with associated right sided flank/pleuritic pain. CXR today showed stable chronic findings of ILD without acute abnormality.   Recommendations: - Continue Augmentin as directed  - Prednisone taper 40mg  x 3 days; 30mg  x 3 days; 20mg  x 3 days; 10mg  x 3 days - Take Delsym cough syrup 93ml q12hours OR Coricidin is ok     - Use Albuterol rescue inhaler 2 puffs every 4-6 hours for breakthrough shortness of breath/wheezing or cough - Take Ibuprofen 400mg  every 4-6 hours for pleuritic pain (do not exceed more than 2,400mg  in 24 hours) - If no improvement please call next week (we are closed memorial day, if needed go to UC/ED if worsen)

## 2020-10-09 NOTE — Assessment & Plan Note (Addendum)
-   Continue Ofev 150mg  BID - FU scheduled for June with TP

## 2020-10-09 NOTE — Patient Instructions (Addendum)
Recommendations: - Continue Augmentin as directed  - Prednisone taper 40mg  x 3 days; 30mg  x 3 days; 20mg  x 3 days; 10mg  x 3 days - Take Delsym cough syrup 41ml q12hours OR Coricidin is ok     - Use Albuterol rescue inhaler 2 puffs every 4-6 hours for breakthrough shortness of breath/wheezing or cough - Take ibuprofen 400mg  every 4-6 hours for pleuritic pain (do not exceed more than 2,400mg  in 24 hours) - If no improvement please call next week (we are closed memorial day, if needed go to UC/ED if worsen)

## 2020-10-09 NOTE — Progress Notes (Signed)
@Patient  ID: , male    DOB: 03-22-1951, 70 y.o.   MRN: [id number]  Chief Complaint  Patient presents with  . Follow-up    Cough with pain on left side    Referring provider: [id number], FNP  HPI: 70 year old male, former smoker quit in 2003 (43 pack year hx). PMH significant for ILD, chronic hypoxemic respiratory failure, chronic seasonal allergies, OSA, GERD, HTN, CAD, type 2 DM non insulin dependent. Patient of Dr. 12-16-1999, last seen 07/23/20.   10/09/2020 Patient presents today for acute visit. Accompanied by his son. Productive cough started 5 days ago, associated left sided pleuritic pain. Associated dyspnea and wheezing with coughing fits. Pain occurs with cough only and not at rest. He is not on any maintenance or rescue inhalers. He was sent in RX for Augmentin which he has started taking. He is also taking coricidin. Denies f/c/s, hemoptysis, chest tightness, chest pain, N/V/D.   No Known Allergies  Immunization History  Administered Date(s) Administered  . DT (Pediatric) 09/30/2004  . Fluad Quad(high Dose 65+) 01/10/2019, 03/18/2020  . Influenza Split 02/09/2015  . Influenza, High Dose Seasonal PF 02/13/2018, 01/10/2019, 03/16/2020  . Influenza, Seasonal, Injecte, Preservative Fre 05/27/2016  . Influenza,inj,Quad PF,6+ Mos 02/17/2014, 05/27/2016, 01/06/2017  . PFIZER(Purple Top)SARS-COV-2 Vaccination 06/24/2019, 07/20/2019, 02/13/2020  . Pneumococcal Conjugate-13 02/09/2015  . Pneumococcal Polysaccharide-23 12/17/2010, 06/24/2016  . Pneumococcal-Unspecified 06/24/2016  . Tdap 01/11/2013  . Zoster Recombinat (Shingrix) 08/29/2017, 03/16/2018  . Zoster, Live 12/17/2010    Past Medical History:  Diagnosis Date  . CAD (coronary artery disease)   . Depression   . Diabetes mellitus (HCC)    Borderline  . Encounter for screening for malignant neoplasm of prostate   . HTN (hypertension)   . Hyperlipidemia   . Non morbid obesity due to excess  calories   . Renal insufficiency   . Rhinitis, allergic     Tobacco History: Social History   Tobacco Use  Smoking Status Former Smoker  . Packs/day: 1.00  . Years: 43.00  . Pack years: 43.00  . Start date: 60  . Quit date: 05/16/2001  . Years since quitting: 19.4  Smokeless Tobacco Never Used   Counseling given: Not Answered   Outpatient Medications Prior to Visit  Medication Sig Dispense Refill  . amoxicillin-clavulanate (AUGMENTIN) 875-125 MG tablet Take 1 tablet by mouth 2 (two) times daily. 14 tablet 0  . atorvastatin (LIPITOR) 40 MG tablet Take 40 mg by mouth daily at 6 PM.     . cetirizine (ZYRTEC) 10 MG tablet Take 10 mg by mouth daily.     . clopidogrel (PLAVIX) 75 MG tablet Take 75 mg by mouth daily.    . fenofibrate 160 MG tablet Take 160 mg by mouth daily.     07/14/2001 losartan (COZAAR) 100 MG tablet Take 50 mg by mouth daily.    . metFORMIN (GLUCOPHAGE) 1000 MG tablet Take 500 mg by mouth daily with breakfast.     . metoprolol succinate (TOPROL XL) 25 MG 24 hr tablet Take 1 tablet (25 mg total) by mouth daily. 90 tablet 3  . Nintedanib (OFEV) 150 MG CAPS Take 1 capsule (150 mg total) by mouth 2 (two) times daily. 120 capsule 5  . Omega-3 Fatty Acids (FISH OIL) 1000 MG CAPS Take 1,000 mg by mouth daily.    . ranitidine (ZANTAC) 75 MG tablet Take 75 mg by mouth 2 (two) times daily.    Marland Kitchen venlafaxine XR (EFFEXOR-XR) 75  MG 24 hr capsule Take 75 mg by mouth daily with breakfast.     . amLODipine (NORVASC) 5 MG tablet Take 1 tablet (5 mg total) by mouth daily. 90 tablet 3   No facility-administered medications prior to visit.   Review of Systems  Review of Systems  Constitutional: Negative.   HENT: Positive for congestion.   Respiratory: Positive for cough, shortness of breath and wheezing. Negative for chest tightness.   Cardiovascular: Negative.     Physical Exam  BP 110/64 (BP Location: Left Arm, Cuff Size: Normal)   Pulse 93   Temp 98.3 F (36.8 C) (Temporal)    Ht 5\' 3"  (1.6 m)   Wt 212 lb (96.2 kg)   SpO2 94% Comment: 2L  BMI 37.55 kg/m  Physical Exam Constitutional:      General: He is not in acute distress.    Appearance: Normal appearance. He is obese. He is not ill-appearing.  HENT:     Head: Normocephalic and atraumatic.  Pulmonary:     Breath sounds: Rhonchi and rales present.     Comments: Rales bases R>L. No rhonchi or wheezing. Right sided flank/pleuritic with cough only.  Musculoskeletal:        General: Normal range of motion.  Skin:    General: Skin is warm and dry.  Neurological:     General: No focal deficit present.     Mental Status: He is alert and oriented to person, place, and time. Mental status is at baseline.  Psychiatric:        Mood and Affect: Mood normal.        Behavior: Behavior normal.        Thought Content: Thought content normal.        Judgment: Judgment normal.      Lab Results:  CBC    Component Value Date/Time   WBC 5.3 11/04/2008 0605   RBC 4.33 11/04/2008 0605   HGB 13.8 11/04/2008 0605   HCT 38.4 (L) 11/04/2008 0605   PLT 182 11/04/2008 0605   MCV 88.7 11/04/2008 0605   MCHC 36.0 11/04/2008 0605   RDW 12.5 11/04/2008 0605   LYMPHSABS 2.4 10/31/2008 1547   MONOABS 0.5 10/31/2008 1547   EOSABS 0.1 10/31/2008 1547   BASOSABS 0.0 10/31/2008 1547    BMET    Component Value Date/Time   NA 139 03/18/2020 0934   NA 138 03/13/2020 1058   K 4.1 03/18/2020 0934   CL 102 03/18/2020 0934   CO2 30 03/18/2020 0934   GLUCOSE 179 (H) 03/18/2020 0934   BUN 21 03/18/2020 0934   BUN 22 03/13/2020 1058   CREATININE 1.49 03/18/2020 0934   CALCIUM 9.2 03/18/2020 0934   GFRNONAA 51 (L) 03/13/2020 1058   GFRAA 59 (L) 03/13/2020 1058    BNP No results found for: BNP  ProBNP No results found for: PROBNP  Imaging: DG Chest 2 View  Result Date: 10/09/2020 CLINICAL DATA:  Cough. EXAM: CHEST - 2 VIEW COMPARISON:  June 22, 2020. FINDINGS: The heart size and mediastinal contours are  within normal limits. Stable bilateral coarse interstitial densities are noted throughout both lungs most consistent with chronic interstitial lung disease or fibrosis. No pneumothorax or pleural effusion is noted. No acute abnormality is noted. The visualized skeletal structures are unremarkable. IMPRESSION: Stable chronic findings as described above. No significant changes noted. Electronically Signed   By: June 24, 2020 M.D.   On: 10/09/2020 16:23     Assessment & Plan:  Acute bronchitis Productive cough started 5 day ago with associated right sided flank/pleuritic pain. CXR today showed stable chronic findings of ILD without acute abnormality.   Recommendations: - Continue Augmentin as directed  - Prednisone taper 40mg  x 3 days; 30mg  x 3 days; 20mg  x 3 days; 10mg  x 3 days - Take Delsym cough syrup 21ml q12hours OR Coricidin is ok     - Use Albuterol rescue inhaler 2 puffs every 4-6 hours for breakthrough shortness of breath/wheezing or cough - Take Ibuprofen 400mg  every 4-6 hours for pleuritic pain (do not exceed more than 2,400mg  in 24 hours) - If no improvement please call next week (we are closed memorial day, if needed go to UC/ED if worsen)   ILD (interstitial lung disease) (HCC) - Continue Ofev 150mg  BID - FU scheduled for June with TP  Chronic hypoxemic respiratory failure (HCC) - Using POC today, O2 94% 2L - No increase O2 demand    , NP 10/09/2020

## 2020-11-05 ENCOUNTER — Other Ambulatory Visit: Payer: Self-pay

## 2020-11-05 ENCOUNTER — Ambulatory Visit: Payer: Medicare Other | Admitting: Adult Health

## 2020-11-05 ENCOUNTER — Encounter: Payer: Self-pay | Admitting: Adult Health

## 2020-11-05 DIAGNOSIS — G4733 Obstructive sleep apnea (adult) (pediatric): Secondary | ICD-10-CM

## 2020-11-05 DIAGNOSIS — J849 Interstitial pulmonary disease, unspecified: Secondary | ICD-10-CM | POA: Diagnosis not present

## 2020-11-05 NOTE — Assessment & Plan Note (Addendum)
Clinically stable on Ofev and oxygen Activity as able  Spirometry and DLCO on return  Plan  Patient Instructions  Continue on OFEV.  Continue on Oxygen 2l/m with activity .  Continue on CPAP At bedtime  .  Activity as tolerated.  Follow up with Dr. Vassie Loll  In 4-6 months with PFT (Spirometry with [facility] ) and As needed

## 2020-11-05 NOTE — Patient Instructions (Addendum)
Continue on OFEV.  Continue on Oxygen 2l/m with activity .  Continue on CPAP At bedtime  .  Activity as tolerated.  Follow up with Dr. Vassie Loll  In 4-6 months with PFT (Spirometry with [facility] ) and As needed

## 2020-11-05 NOTE — Assessment & Plan Note (Signed)
Patient has exertional hypoxemia.  Patient is encouraged on oxygen compliance to maintain O2 saturations greater than 88 to 90%.  Plan  Patient Instructions  Continue on OFEV.  Continue on Oxygen 2l/m with activity .  Continue on CPAP At bedtime  .  Activity as tolerated.  Follow up with Dr. Vassie Loll  In 4-6 months with PFT (Spirometry with [facility] ) and As needed

## 2020-11-05 NOTE — Progress Notes (Signed)
@Patient  ID: , male    DOB: 09/14/1950, 70 y.o.   MRN: [id number]  Chief Complaint  Patient presents with   Follow-up    Referring provider: [id number], FNP  HPI: 70 year old male former smoker followed for obstructive sleep apnea and idiopathic pulmonary fibrosis (UIP per CT ) and chronic respiratory failure on oxygen 2 L (O2 started 2022)  Daughter is a respiratory therapist at [employer] Wife got severe COVID 19 infection early 2020 with long-haul symptoms  TEST/EVENTS :  ILD work-up-high-resolution CT chest diagnosed ILD. PFT showed mild restriction Autoimmune extensive work-up was negative Patient has declined anti-fibrotic therapies   PFT 05/2017 ratio 88, FVC 71%, DLCO 76%   01/06/17: FVC 2.29 L (67%) FEV1 2.03 L (81%) FEV1/FVC 0.88   DLCO corrected 75%   06/24/16: FVC 2.49 L (76%) FEV1 2.21 L (91%) FEV1/FVC 0.89 FEF 25-75 3.41 L (177%) negative bronchodilator response TLC 3.77 L (69%) RV 53% ERV 130% DLCO uncorrected 66%     03/12/2020-pulmonary function test-spirometry with DLCO-FVC 2.04 (64% predicted), ratio 85, FEV1 1.75 (76% predicted), DLCO 15.69 (78% predicted)   03/14/2020 01/06/17:  Walked 412 meters / Baseline Sat 99% on RA / Nadir Sat 90% on RA @ end of test 09/29/16:  Walked 354 meters / Baseline Sat 99% on RA / Nadir Sat 91% on RA @ end of test 06/24/16:  Walked 452 meters / Baseline Sat 97% on RA / Nadir Sat 92% on RA @ end of test   PSG(06/07/16):  AHI 15.0 events/hour., nadir satn 78%   >> Optimal CPAP pressure 14 cm   IMAGING   HRCT 02/2019  c/w UIP , air trapping raises concern for HP, minimally worse compared to 2018   CT CHEST W/O 07/2016 :  1.1 CM subpleural nodule adjacent major fissure left lower lobe unchanged in size compared with imaging from 2008. Fibrotic changes again noted.   HRCT CHEST W/O 05/2016   Patient does have minimal subpleural patchy groundglass. Traction bronchiectasis with areas of honeycombing and  subpleural as well as central distribution of reticulation with intralobular septal thickening. There is a slight craniocaudal progression suggestive of a UIP pattern.  Subcarinal and precarinal lymph nodes measuring up to 1.1 & 1.2 cm in short axis. Patient does have an 1.1 CM oblong nodule within his left lower lobe that corresponds to previously noted left lower lobe nodule.     CT CHEST W/ 04/20/16 (per radiologist): 9 mm left lower lobe nodule. Diffuse interlobular septal thickening with architectural distortion inferiorly with honeycombing versus bronchiectasis and medial right lower lobe. No pleural effusion or calcification. No cardiomegaly or pericardial effusion. No lymphadenopathy or mass with a mediastinum. Fatty infiltration of the liver noted.   CT CHEST W/ 3/6/9 (per radiologist):01-19-1990 Irregular interstitial prominence in addition. Ill-defined cystic lucencies diffusely. Fracture of left 10th rib noted.  11/05/2020 Follow up : IPF and OSA  Patient returns for a 1 month follow-up visit.  Patient was seen last visit with an acute bronchitis.  He was given Augmentin and a prednisone taper.  Patient says he is feeling much better.  Cough and congestion have decreased.  And feels that he is back to baseline.  Patient has underlying IPF.  He is on Ofev 150 mg twice daily.  He denies any nausea vomiting or weight loss.  Appetite is fair.  Has chronic diarrhea , has been some better lately . Now down to 2 stools a day.  Says overall breathing  is doing the same.  No increased shortness of breath or decrease in activity tolerance. Tries to remains active, has been more active since getting oxygen  Feels much better. Helps with grandkids. Gets winded with heavy activity /chores.   He remains on oxygen 2 L.  Denies any increased oxygen demands. Got a POC. Does admit he does not wear all the time . We discussed compliance .    Patient has underlying obstructive sleep apnea.  He is on nocturnal CPAP.   Patient says he is doing well on CPAP.  He wears it every single night.  CPAP download shows excellent compliance with daily average usage at 8 hours.  Patient is on CPAP 14 cm H2O.  AHI 0.4. Patient denies any significant daytime sleepiness.  No Known Allergies  Immunization History  Administered Date(s) Administered   DT (Pediatric) 09/30/2004   Fluad Quad(high Dose 65+) 01/10/2019, 03/18/2020   Influenza Split 02/09/2015   Influenza, High Dose Seasonal PF 02/13/2018, 01/10/2019, 03/16/2020   Influenza, Seasonal, Injecte, Preservative Fre 05/27/2016   Influenza,inj,Quad PF,6+ Mos 02/17/2014, 05/27/2016, 01/06/2017   PFIZER(Purple Top)SARS-COV-2 Vaccination 06/24/2019, 07/20/2019, 02/13/2020   Pneumococcal Conjugate-13 02/09/2015   Pneumococcal Polysaccharide-23 12/17/2010, 06/24/2016   Pneumococcal-Unspecified 06/24/2016   Tdap 01/11/2013   Zoster Recombinat (Shingrix) 08/29/2017, 03/16/2018   Zoster, Live 12/17/2010    Past Medical History:  Diagnosis Date   CAD (coronary artery disease)    Depression    Diabetes mellitus (HCC)    Borderline   Encounter for screening for malignant neoplasm of prostate    HTN (hypertension)    Hyperlipidemia    Non morbid obesity due to excess calories    Renal insufficiency    Rhinitis, allergic     Tobacco History: Social History   Tobacco Use  Smoking Status Former   Packs/day: 1.00   Years: 43.00   Pack years: 43.00   Types: Cigarettes   Start date: 22   Quit date: 05/16/2001   Years since quitting: 19.4  Smokeless Tobacco Never   Counseling given: Not Answered   Outpatient Medications Prior to Visit  Medication Sig Dispense Refill   albuterol (VENTOLIN HFA) 108 (90 Base) MCG/ACT inhaler Inhale 2 puffs into the lungs every 6 (six) hours as needed for wheezing or shortness of breath. 8 g 2   atorvastatin (LIPITOR) 40 MG tablet Take 40 mg by mouth daily at 6 PM.      cetirizine (ZYRTEC) 10 MG tablet Take 10 mg by mouth  daily.      clopidogrel (PLAVIX) 75 MG tablet Take 75 mg by mouth daily.     fenofibrate 160 MG tablet Take 160 mg by mouth daily.      losartan (COZAAR) 100 MG tablet Take 50 mg by mouth daily.     metFORMIN (GLUCOPHAGE) 1000 MG tablet Take 500 mg by mouth daily with breakfast.      metoprolol succinate (TOPROL XL) 25 MG 24 hr tablet Take 1 tablet (25 mg total) by mouth daily. 90 tablet 3   Nintedanib (OFEV) 150 MG CAPS Take 1 capsule (150 mg total) by mouth 2 (two) times daily. 120 capsule 5   Omega-3 Fatty Acids (FISH OIL) 1000 MG CAPS Take 1,000 mg by mouth daily.     ranitidine (ZANTAC) 75 MG tablet Take 75 mg by mouth 2 (two) times daily.     venlafaxine XR (EFFEXOR-XR) 75 MG 24 hr capsule Take 75 mg by mouth daily with breakfast.      amLODipine (  NORVASC) 5 MG tablet Take 1 tablet (5 mg total) by mouth daily. 90 tablet 3   amoxicillin-clavulanate (AUGMENTIN) 875-125 MG tablet Take 1 tablet by mouth 2 (two) times daily. (Patient not taking: Reported on 11/05/2020) 14 tablet 0   predniSONE (DELTASONE) 10 MG tablet Take 4 tabs po daily x 3 days; then 3 tabs daily x3 days; then 2 tabs daily x3 days; then 1 tab daily x 3 days; then stop (Patient not taking: Reported on 11/05/2020) 30 tablet 0   No facility-administered medications prior to visit.     Review of Systems:   Constitutional:   No  weight loss, night sweats,  Fevers, chills,  +fatigue, or  lassitude.  HEENT:   No headaches,  Difficulty swallowing,  Tooth/dental problems, or  Sore throat,                No sneezing, itching, ear ache, nasal congestion, post nasal drip,   CV:  No chest pain,  Orthopnea, PND, swelling in lower extremities, anasarca, dizziness, palpitations, syncope.   GI  No heartburn, indigestion, abdominal pain, nausea, vomiting, diarrhea, change in bowel habits, loss of appetite, bloody stools.   Resp:  .  No chest wall deformity  Skin: no rash or lesions.  GU: no dysuria, change in color of urine, no  urgency or frequency.  No flank pain, no hematuria   MS:  No joint pain or swelling.  No decreased range of motion.  No back pain.    Physical Exam  BP 122/76   Pulse 92   Ht 5\' 3"  (1.6 m)   Wt 201 lb 9.6 oz (91.4 kg)   SpO2 95%   BMI 35.71 kg/m   GEN: A/Ox3; pleasant , NAD, well nourished    HEENT:  [place]/AT,    NOSE-clear, THROAT-clear, no lesions, no postnasal drip or exudate noted.   NECK:  Supple w/ fair ROM; no JVD; normal carotid impulses w/o bruits; no thyromegaly or nodules palpated; no lymphadenopathy.    RESP  BB crackles   no accessory muscle use, no dullness to percussion  CARD:  RRR, no m/r/g, no peripheral edema, pulses intact, no cyanosis or clubbing.  GI:   Soft & nt; nml bowel sounds; no organomegaly or masses detected.   Musco: Warm bil, no deformities or joint swelling noted.   Neuro: alert, no focal deficits noted.    Skin: Warm, no lesions or rashes    BNP No results found for: BNP  ProBNP No results found for: PROBNP  Imaging: DG Chest 2 View  Result Date: 10/09/2020 CLINICAL DATA:  Cough. EXAM: CHEST - 2 VIEW COMPARISON:  June 22, 2020. FINDINGS: The heart size and mediastinal contours are within normal limits. Stable bilateral coarse interstitial densities are noted throughout both lungs most consistent with chronic interstitial lung disease or fibrosis. No pneumothorax or pleural effusion is noted. No acute abnormality is noted. The visualized skeletal structures are unremarkable. IMPRESSION: Stable chronic findings as described above. No significant changes noted. Electronically Signed   By: Lupita RaiderJames  Green Jr M.D.   On: 10/09/2020 16:23      PFT Results Latest Ref Rng & Units 03/12/2020 09/11/2019 01/10/2019 06/13/2017 01/06/2017 09/29/2016 06/24/2016  FVC-Pre L 2.04 2.08 2.22 2.41 2.29 2.31 2.49  FVC-Predicted Pre % 64 65 66 71 67 67 76  FVC-Post L - 2.21 2.36 - - - 2.46  FVC-Predicted Post % - 69 70 - - - 75  Pre FEV1/FVC % % 85 86 86  88 88 87  89  Post FEV1/FCV % % - 85 84 - - - 88  FEV1-Pre L 1.75 1.79 1.90 2.13 2.03 2.00 2.21  FEV1-Predicted Pre % 76 77 77 85 81 79 91  FEV1-Post L - 1.88 1.99 - - - 2.16  DLCO uncorrected ml/min/mmHg 15.69 13.44 13.36 17.43 17.35 15.83 14.29  DLCO UNC% % 78 67 64 76 75 69 66  DLCO corrected ml/min/mmHg 15.69 - - 16.53 17.30 15.11 -  DLCO COR %Predicted % 78 - - 72 75 65 -  DLVA Predicted % 107 97 97 108 119 104 105  TLC L - 3.48 4.06 - - - 3.77  TLC % Predicted % - 64 72 - - - 69  RV % Predicted % - 46 83 - - - 53    No results found for: NITRICOXIDE      Assessment & Plan:   OSA (obstructive sleep apnea) Excellent control and compliance on CPAP  Plan  Patient Instructions  Continue on OFEV.  Continue on Oxygen 2l/m with activity .  Continue on CPAP At bedtime  .  Activity as tolerated.  Follow up with Dr. Vassie Loll  In 4-6 months with PFT (Spirometry with [facility] ) and As needed            ILD (interstitial lung disease) (HCC) Clinically stable on Ofev and oxygen Activity as able  Spirometry and DLCO on return  Plan  Patient Instructions  Continue on OFEV.  Continue on Oxygen 2l/m with activity .  Continue on CPAP At bedtime  .  Activity as tolerated.  Follow up with Dr. Vassie Loll  In 4-6 months with PFT (Spirometry with [facility] ) and As needed            Chronic hypoxemic respiratory failure [facility]) Patient has exertional hypoxemia.  Patient is encouraged on oxygen compliance to maintain O2 saturations greater than 88 to 90%.  Plan  Patient Instructions  Continue on OFEV.  Continue on Oxygen 2l/m with activity .  Continue on CPAP At bedtime  .  Activity as tolerated.  Follow up with Dr. Vassie Loll  In 4-6 months with PFT (Spirometry with [facility] ) and As needed            OBESITY Healthy weight loss   I spent  32  minutes dedicated to the care of this patient on the date of this encounter to include pre-visit review of records, face-to-face time with the patient  discussing conditions above, post visit ordering of testing, clinical documentation with the electronic health record, making appropriate referrals as documented, and communicating necessary findings to members of the patients care team.    Rubye Oaks, NP 11/05/2020

## 2020-11-05 NOTE — Assessment & Plan Note (Signed)
Healthy weight loss 

## 2020-11-05 NOTE — Assessment & Plan Note (Signed)
Excellent control and compliance on CPAP  Plan  Patient Instructions  Continue on OFEV.  Continue on Oxygen 2l/m with activity .  Continue on CPAP At bedtime  .  Activity as tolerated.  Follow up with Dr. Vassie Loll  In 4-6 months with PFT (Spirometry with [facility] ) and As needed

## 2020-12-18 ENCOUNTER — Ambulatory Visit: Payer: Medicare Other

## 2020-12-18 ENCOUNTER — Other Ambulatory Visit (HOSPITAL_BASED_OUTPATIENT_CLINIC_OR_DEPARTMENT_OTHER): Payer: Self-pay

## 2020-12-18 ENCOUNTER — Ambulatory Visit: Payer: Self-pay

## 2020-12-22 ENCOUNTER — Other Ambulatory Visit: Payer: Self-pay | Admitting: Primary Care

## 2020-12-25 ENCOUNTER — Ambulatory Visit: Payer: Medicare Other | Attending: Internal Medicine

## 2020-12-25 DIAGNOSIS — Z23 Encounter for immunization: Secondary | ICD-10-CM

## 2020-12-25 NOTE — Progress Notes (Signed)
   Covid-19 Vaccination Clinic  Name:  Gary Keller    MRN: [id number] DOB: 08/29/1950  12/25/2020  [name] was observed post Covid-19 immunization for 15 minutes without incident. He was provided with Vaccine Information Sheet and instruction to access the V-Safe system.   [name] was instructed to call 911 with any severe reactions post vaccine: Difficulty breathing  Swelling of face and throat  A fast heartbeat  A bad rash all over body  Dizziness and weakness   Immunizations Administered     Name Date Dose VIS Date Route   PFIZER Comrnaty(Gray TOP) Covid-19 Vaccine 12/25/2020 11:42 AM 0.3 mL 04/23/2020 Intramuscular   Manufacturer: [employer], [employer]   Lot: [id number]   NDC: [phone]

## 2021-01-04 ENCOUNTER — Other Ambulatory Visit (HOSPITAL_BASED_OUTPATIENT_CLINIC_OR_DEPARTMENT_OTHER): Payer: Self-pay

## 2021-01-04 MED ORDER — COVID-19 MRNA VAC-TRIS(PFIZER) 30 MCG/0.3ML IM SUSP
INTRAMUSCULAR | 0 refills | Status: DC
  Filled 2021-01-04: qty 0.3, 1d supply, fill #0

## 2021-02-20 ENCOUNTER — Other Ambulatory Visit: Payer: Self-pay | Admitting: Cardiology

## 2021-03-02 NOTE — Progress Notes (Signed)
HPI: FU CAD. Cardiac catheterization 6/10 revealed normal LV function. There was significant coronary disease and he had PCI of his posterior lateral and right coronary artery. He also had PCI of his PDA. Note all of these were drug-eluting stents. Nuclear study November 2016 showed ejection fraction 56%. There were ECG changes but perfusion normal. Abd ultrasound 12/17 showed no aneurysm.  Chest CT February 2022 showed pulmonary fibrosis.  Since last seen, he is on oxygen now for interstitial lung disease.  When he is on his oxygen he denies dyspnea.  No chest pain, palpitations or syncope.  No pedal edema.  Current Outpatient Medications  Medication Sig Dispense Refill   acetaminophen (TYLENOL) 325 MG tablet Take by mouth.     albuterol (VENTOLIN HFA) 108 (90 Base) MCG/ACT inhaler INHALE 2 PUFFS INTO THE LUNGS EVERY 6 HOURS AS NEEDED FOR WHEEZING OR SHORTNESS OF BREATH 6.7 g 11   amLODipine (NORVASC) 5 MG tablet TAKE 1 TABLET(5 MG) BY MOUTH DAILY 90 tablet 3   amoxicillin-clavulanate (AUGMENTIN) 875-125 MG tablet Take 1 tablet by mouth 2 (two) times daily. 14 tablet 0   atorvastatin (LIPITOR) 40 MG tablet Take 40 mg by mouth daily at 6 PM.      cetirizine (ZYRTEC) 10 MG tablet Take 10 mg by mouth daily.      clopidogrel (PLAVIX) 75 MG tablet Take 75 mg by mouth daily.     COVID-19 mRNA Vac-TriS, Pfizer, SUSP injection Inject into the muscle. 0.3 mL 0   empagliflozin (JARDIANCE) 10 MG TABS tablet Take by mouth.     fenofibrate 160 MG tablet Take 160 mg by mouth daily.      fluconazole (DIFLUCAN) 150 MG tablet Take 1 dose weekly     glimepiride (AMARYL) 2 MG tablet Take by mouth.     losartan (COZAAR) 100 MG tablet Take 50 mg by mouth daily.     losartan (COZAAR) 100 MG tablet Take 1 tablet by mouth daily.     metFORMIN (GLUCOPHAGE-XR) 500 MG 24 hr tablet Take by mouth.     metoprolol succinate (TOPROL XL) 25 MG 24 hr tablet Take 1 tablet (25 mg total) by mouth daily. 90 tablet 3    Nintedanib (OFEV) 150 MG CAPS Take 1 capsule (150 mg total) by mouth 2 (two) times daily. 120 capsule 5   Omega-3 Fatty Acids (FISH OIL) 1000 MG CAPS Take 1,000 mg by mouth daily.     predniSONE (DELTASONE) 10 MG tablet Take 4 tabs po daily x 3 days; then 3 tabs daily x3 days; then 2 tabs daily x3 days; then 1 tab daily x 3 days; then stop 30 tablet 0   ranitidine (ZANTAC) 75 MG tablet Take 75 mg by mouth 2 (two) times daily.     venlafaxine XR (EFFEXOR-XR) 75 MG 24 hr capsule Take 75 mg by mouth daily with breakfast.      metFORMIN (GLUCOPHAGE) 1000 MG tablet Take 500 mg by mouth daily with breakfast.  (Patient not taking: Reported on 03/09/2021)     No current facility-administered medications for this visit.     Past Medical History:  Diagnosis Date   CAD (coronary artery disease)    Depression    Diabetes mellitus (HCC)    Borderline   Encounter for screening for malignant neoplasm of prostate    HTN (hypertension)    Hyperlipidemia    Non morbid obesity due to excess calories    Renal insufficiency    Rhinitis,  allergic     Past Surgical History:  Procedure Laterality Date   APPENDECTOMY     COLONOSCOPY WITH ESOPHAGOGASTRODUODENOSCOPY (EGD)     LEFT HEART CATH  2006   Stent x3    Social History   Socioeconomic History   Marital status: Single    Spouse name: Not on file   Number of children: 3   Years of education: Not on file   Highest education level: Not on file  Occupational History    Comment: Retired  Tobacco Use   Smoking status: Former    Packs/day: 1.00    Years: 43.00    Pack years: 43.00    Types: Cigarettes    Start date: 24    Quit date: 05/16/2001    Years since quitting: 19.8   Smokeless tobacco: Never  Vaping Use   Vaping Use: Never used  Substance and Sexual Activity   Alcohol use: Yes    Alcohol/week: 0.0 standard drinks    Comment: Occasional   Drug use: No   Sexual activity: Not on file  Other Topics Concern   Not on file  Social  History Narrative   [facility] (05/27/16):   Originally from [place]. Has always lived in [place]. 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 Does have exposure to chemicals from spraying. No mold exposure. Remote cockatiel exposure in a previous home. No hot tub exposure. Enjoys watching his grandchildren and hunting.    Social Determinants of Health   Financial Resource Strain: Not on file  Food Insecurity: Not on file  Transportation Needs: Not on file  Physical Activity: Not on file  Stress: Not on file  Social Connections: Not on file  Intimate Partner Violence: Not on file    Family History  Problem Relation Age of Onset   Diabetes Brother    Heart disease Brother    Heart disease Father    Hypertension Brother    Stroke Mother    Stroke Brother    Lung disease Neg Hx    Rheumatologic disease Neg Hx     ROS: no fevers or chills, productive cough, hemoptysis, dysphasia, odynophagia, melena, hematochezia, dysuria, hematuria, rash, seizure activity, orthopnea, PND, pedal edema, claudication. Remaining systems are negative.  Physical Exam: Well-developed well-nourished in no acute distress.  Skin is warm and dry.  HEENT is normal.  Neck is supple.  Chest is clear to auscultation with normal expansion.  Cardiovascular exam is regular rate and rhythm.  Abdominal exam nontender or distended. No masses palpated. Extremities show no edema. neuro grossly intact  ECG-normal sinus rhythm at a rate of 84, no ST changes.  Personally reviewed  A/P  1 coronary artery disease-patient denies chest pain.  Continue Plavix and statin.  2 hypertension-blood pressure controlled.  Continue present medications and follow.  Check potassium and renal function.  3 hyperlipidemia-continue statin.  Check lipids and liver.  4 dilated thoracic aorta-not evident on most recent CT scan.  5 obesity-patient needs to continue efforts at weight loss.  6 interstitial lung  disease-followed by pulmonary.  7 obstructive sleep apnea-continue CPAP.  Olga Millers, MD

## 2021-03-09 ENCOUNTER — Ambulatory Visit: Payer: Medicare Other | Admitting: Cardiology

## 2021-03-09 ENCOUNTER — Ambulatory Visit: Payer: Medicare Other | Admitting: Pulmonary Disease

## 2021-03-09 ENCOUNTER — Other Ambulatory Visit: Payer: Self-pay

## 2021-03-09 ENCOUNTER — Encounter: Payer: Self-pay | Admitting: Pulmonary Disease

## 2021-03-09 ENCOUNTER — Ambulatory Visit (INDEPENDENT_AMBULATORY_CARE_PROVIDER_SITE_OTHER): Payer: Medicare Other | Admitting: Pulmonary Disease

## 2021-03-09 ENCOUNTER — Encounter: Payer: Self-pay | Admitting: Cardiology

## 2021-03-09 VITALS — BP 132/70 | HR 84 | Ht 63.0 in | Wt 217.4 lb

## 2021-03-09 VITALS — BP 138/83 | HR 84 | Temp 97.6°F | Ht 63.0 in | Wt 216.0 lb

## 2021-03-09 DIAGNOSIS — I2510 Atherosclerotic heart disease of native coronary artery without angina pectoris: Secondary | ICD-10-CM | POA: Diagnosis not present

## 2021-03-09 DIAGNOSIS — G4733 Obstructive sleep apnea (adult) (pediatric): Secondary | ICD-10-CM

## 2021-03-09 DIAGNOSIS — E7800 Pure hypercholesterolemia, unspecified: Secondary | ICD-10-CM

## 2021-03-09 DIAGNOSIS — Z23 Encounter for immunization: Secondary | ICD-10-CM

## 2021-03-09 DIAGNOSIS — I10 Essential (primary) hypertension: Secondary | ICD-10-CM | POA: Diagnosis not present

## 2021-03-09 DIAGNOSIS — J849 Interstitial pulmonary disease, unspecified: Secondary | ICD-10-CM

## 2021-03-09 DIAGNOSIS — Z5181 Encounter for therapeutic drug level monitoring: Secondary | ICD-10-CM | POA: Diagnosis not present

## 2021-03-09 DIAGNOSIS — J9611 Chronic respiratory failure with hypoxia: Secondary | ICD-10-CM

## 2021-03-09 LAB — PULMONARY FUNCTION TEST
DL/VA % pred: 110 %
DL/VA: 4.63 ml/min/mmHg/L
DLCO cor % pred: 64 %
DLCO cor: 12.82 ml/min/mmHg
DLCO unc % pred: 64 %
DLCO unc: 12.82 ml/min/mmHg
FEF 25-75 Pre: 2.13 L/sec
FEF2575-%Pred-Pre: 123 %
FEV1-%Pred-Pre: 73 %
FEV1-Pre: 1.67 L
FEV1FVC-%Pred-Pre: 115 %
FEV6-%Pred-Pre: 67 %
FEV6-Pre: 1.97 L
FEV6FVC-%Pred-Pre: 107 %
FVC-%Pred-Pre: 63 %
FVC-Pre: 1.97 L
Pre FEV1/FVC ratio: 85 %
Pre FEV6/FVC Ratio: 100 %

## 2021-03-09 NOTE — Progress Notes (Signed)
Spirometry/DLCO performed today. 

## 2021-03-09 NOTE — Progress Notes (Signed)
   Subjective:    Patient ID: Gary Keller, male    DOB: 1951/03/11, 70 y.o.   MRN: [id number]  HPI 70 yo for FU of OSA and idiopathic pulmonary fibrosis with chronic respiratory failure   12/2018 Due to drop in lung function compared to 2019, he was started on OFEV    06/2020 started on oxygen  51-month follow-up visit. He arrives on oxygen, nasal cannula, reports shortness of breath is at baseline. No new cough, pedal edema. He is compliant with CPAP during sleep, no problems with mask or pressure. Has occasional loose stools, is adding fiber in diet We reviewed HRCT and PFTs   Significant tests/ events reviewed  PFT 02/2021 >>  FVC 63%, DLCO 12.8/64%  02/2020 >> stable, FVC 64% DLCO 78%   12/2018 >> FVC 66%, TLC 72%, DLCO 64% 02/2018 FVC 66%   05/2017 ratio 88, FVC 71%, DLCO 76%   01/06/17: FVC 2.29 L (67%) FEV1 2.03 L (81%) FEV1/FVC 0.88   DLCO corrected 75%   06/24/16: FVC 2.49 L (76%) FEV1 2.21 L (91%) FEV1/FVC 0.89  negative bronchodilator response TLC 3.77 L (69%)  DLCO uncorrected 66%   01/06/17:  Walked 412 meters / Baseline Sat 99% on RA / Nadir Sat 90% on RA @ end of test     PSG(06/07/16):  AHI 15.0 events/hour., nadir satn 78%   >> Optimal CPAP pressure 14 cm    HRCT 06/2020 UIP, unchanged compared to 2021, air trapping raises question of chronic hypersensitivity pneumonitis   HRCT 02/2019  c/w UIP , air trapping raises concern for HP, minimally worse compared to 2018   CT CHEST W/O 07/2016 :  1.1 CM subpleural nodule adjacent major fissure left lower lobe unchanged in size compared with imaging from 2008. Fibrotic changes again noted.    HRCT CHEST W/O 05/2016 UIP pattern.  Subcarinal and precarinal lymph nodes measuring up to 1.1 & 1.2 cm in short axis. 1.1 CM oblong nodule within left lower lobe      CT CHEST W/ 04/2016 : 9 mm left lower lobe nodule. Diffuse interlobular septal thickening with architectural distortion inferiorly with honeycombing  versus bronchiectasis and medial right lower lobe.     CT CHEST W/ 07/2007:. Irregular interstitial prominence in addition. Ill-defined cystic lucencies diffusely. Fracture of left 10th rib noted.  Review of Systems neg for any significant sore throat, dysphagia, itching, sneezing, nasal congestion or excess/ purulent secretions, fever, chills, sweats, unintended wt loss, pleuritic or exertional cp, hempoptysis, orthopnea pnd or change in chronic leg swelling. Also denies presyncope, palpitations, heartburn, abdominal pain, nausea, vomiting, diarrhea or change in bowel or urinary habits, dysuria,hematuria, rash, arthralgias, visual complaints, headache, numbness weakness or ataxia.     Objective:   Physical Exam  Gen. Pleasant, obese, in no distress ENT - no lesions, no post nasal drip Neck: No JVD, no thyromegaly, no carotid bruits Lungs: no use of accessory muscles, no dullness to percussion, bibasal dry  rales no rhonchi  Cardiovascular: Rhythm regular, heart sounds  normal, no murmurs or gallops, no peripheral edema Musculoskeletal: No deformities, no cyanosis or clubbing , no tremors       Assessment & Plan:

## 2021-03-09 NOTE — Assessment & Plan Note (Signed)
He is compliant and CPAP is helped improve his daytime somnolence and fatigue. CPAP supplies will be renewed as needed

## 2021-03-09 NOTE — Patient Instructions (Signed)
Spirometry/DLCO performed today. 

## 2021-03-09 NOTE — Patient Instructions (Signed)

## 2021-03-09 NOTE — Patient Instructions (Signed)
FLu shot Check LFTs Lung function appears stable

## 2021-03-09 NOTE — Assessment & Plan Note (Addendum)
Check LFTs while on ofev

## 2021-03-09 NOTE — Assessment & Plan Note (Signed)
Clinically appears to be UIP pattern consistent with IPF.  Lung function shows that FVC is stable, DLCO appears to have dropped but was at this level in 2020.  Overall there has been a slight drop compared to 2018 and CT is slightly worse when compared to 2008 indicating gradual progression He is tolerating Ofev well and we will continue.

## 2021-03-09 NOTE — Assessment & Plan Note (Signed)
Continue 2 L oxygen during exertion

## 2021-03-10 LAB — HEPATIC FUNCTION PANEL
ALT: 20 U/L (ref 0–53)
AST: 18 U/L (ref 0–37)
Albumin: 4.5 g/dL (ref 3.5–5.2)
Alkaline Phosphatase: 77 U/L (ref 39–117)
Bilirubin, Direct: 0.1 mg/dL (ref 0.0–0.3)
Total Bilirubin: 0.4 mg/dL (ref 0.2–1.2)
Total Protein: 7.7 g/dL (ref 6.0–8.3)

## 2021-03-18 LAB — LIPID PANEL
Chol/HDL Ratio: 7.7 ratio — ABNORMAL HIGH (ref 0.0–5.0)
Cholesterol, Total: 147 mg/dL (ref 100–199)
HDL: 19 mg/dL — ABNORMAL LOW (ref >39)
LDL Chol Calc (NIH): 36 mg/dL (ref 0–99)
Triglycerides: 664 mg/dL (ref 0–149)
VLDL Cholesterol Cal: 92 mg/dL — ABNORMAL HIGH (ref 5–40)

## 2021-03-18 LAB — COMPREHENSIVE METABOLIC PANEL
ALT: 29 IU/L (ref 0–44)
AST: 22 IU/L (ref 0–40)
Albumin/Globulin Ratio: 1.6 (ref 1.2–2.2)
Albumin: 4.5 g/dL (ref 3.7–4.7)
Alkaline Phosphatase: 103 IU/L (ref 44–121)
BUN/Creatinine Ratio: 9 — ABNORMAL LOW (ref 10–24)
BUN: 14 mg/dL (ref 8–27)
Bilirubin Total: 0.3 mg/dL (ref 0.0–1.2)
CO2: 26 mmol/L (ref 20–29)
Calcium: 9.4 mg/dL (ref 8.6–10.2)
Chloride: 98 mmol/L (ref 96–106)
Creatinine, Ser: 1.5 mg/dL — ABNORMAL HIGH (ref 0.76–1.27)
Globulin, Total: 2.9 g/dL (ref 1.5–4.5)
Glucose: 178 mg/dL — ABNORMAL HIGH (ref 70–99)
Potassium: 4.3 mmol/L (ref 3.5–5.2)
Sodium: 138 mmol/L (ref 134–144)
Total Protein: 7.4 g/dL (ref 6.0–8.5)
eGFR: 49 mL/min/{1.73_m2} — ABNORMAL LOW (ref >59)

## 2021-03-23 ENCOUNTER — Other Ambulatory Visit: Payer: Self-pay

## 2021-03-23 ENCOUNTER — Ambulatory Visit: Payer: Medicare Other | Admitting: Pharmacist

## 2021-03-23 VITALS — BP 146/92 | HR 95 | Resp 14 | Ht 62.75 in | Wt 215.0 lb

## 2021-03-23 DIAGNOSIS — E785 Hyperlipidemia, unspecified: Secondary | ICD-10-CM

## 2021-03-23 DIAGNOSIS — I2510 Atherosclerotic heart disease of native coronary artery without angina pectoris: Secondary | ICD-10-CM | POA: Diagnosis not present

## 2021-03-23 DIAGNOSIS — R1310 Dysphagia, unspecified: Secondary | ICD-10-CM | POA: Insufficient documentation

## 2021-03-23 DIAGNOSIS — I10 Essential (primary) hypertension: Secondary | ICD-10-CM

## 2021-03-23 DIAGNOSIS — B9681 Helicobacter pylori [H. pylori] as the cause of diseases classified elsewhere: Secondary | ICD-10-CM | POA: Insufficient documentation

## 2021-03-23 MED ORDER — ICOSAPENT ETHYL 1 G PO CAPS: 2.0000 g | ORAL_CAPSULE | Freq: Two times a day (BID) | ORAL | 1 refills | Status: DC

## 2021-03-23 MED ORDER — ATORVASTATIN CALCIUM 80 MG PO TABS: 80.0000 mg | ORAL_TABLET | Freq: Every day | ORAL | 1 refills | Status: DC

## 2021-03-23 NOTE — Progress Notes (Signed)
Patient ID: Gary Keller                 DOB: 08/22/1950                    MRN: [id number]     HPI: Gary Keller is a 70 y.o. male patient referred to lipid clinic by Dr Jens Som. PMH is significant for HTN, T2DM (A1c 9.2), CAD, CKD, ASCVD, and hypertriglyceridemia.   Patient presents today in good spirits.  Reports usually wife and son come to his appointments since he can be forgetful but today he came alone  Has been seeing his primary care provider at [facility] for a candida infection on foreskin.  Has appointment with urology later this month. Testosterone levels low at PCP.  Of note, patient is also on Jardiance and has elevated A1c.     Had not been following a DM friendly diet but is trying to change his eating habits.  Wife and son typically cook his food at home.  No longer drinks sugary drinks.  Now drinks sugar free mountain dew, sugar free diet pepsi, and sugar free dr pepper.    Reports he does not drink alcohol.  Reports compliance with atorvastatin 40mg , fenofibrate 160mg , and fish oil 100mg  twice daily.   Current Medications:  atorvastatin 40mg  daily Fenofibrate 160mg  daily Intolerances: N/A Risk Factors: DM, CKD, CAD LDL goal: <55 Triglyceride goal <150  Labs: LDL 36, HDL 19, Trigs 664, TC 147 (03/18/21)  Past Medical History:  Diagnosis Date   CAD (coronary artery disease)    Depression    Diabetes mellitus (HCC)    Borderline   Encounter for screening for malignant neoplasm of prostate    HTN (hypertension)    Hyperlipidemia    Non morbid obesity due to excess calories    Renal insufficiency    Rhinitis, allergic     Current Outpatient Medications on File Prior to Visit  Medication Sig Dispense Refill   acetaminophen (TYLENOL) 325 MG tablet Take by mouth.     albuterol (VENTOLIN HFA) 108 (90 Base) MCG/ACT inhaler INHALE 2 PUFFS INTO THE LUNGS EVERY 6 HOURS AS NEEDED FOR WHEEZING OR SHORTNESS OF BREATH 6.7 g 11   amLODipine (NORVASC) 5 MG tablet  TAKE 1 TABLET(5 MG) BY MOUTH DAILY 90 tablet 3   amoxicillin-clavulanate (AUGMENTIN) 875-125 MG tablet Take 1 tablet by mouth 2 (two) times daily. 14 tablet 0   atorvastatin (LIPITOR) 40 MG tablet Take 40 mg by mouth daily at 6 PM.      cetirizine (ZYRTEC) 10 MG tablet Take 10 mg by mouth daily.      clopidogrel (PLAVIX) 75 MG tablet Take 75 mg by mouth daily.     COVID-19 mRNA Vac-TriS, Pfizer, SUSP injection Inject into the muscle. 0.3 mL 0   empagliflozin (JARDIANCE) 10 MG TABS tablet Take by mouth.     fenofibrate 160 MG tablet Take 160 mg by mouth daily.      fluconazole (DIFLUCAN) 150 MG tablet Take 1 dose weekly     glimepiride (AMARYL) 2 MG tablet Take by mouth.     losartan (COZAAR) 100 MG tablet Take 50 mg by mouth daily.     losartan (COZAAR) 100 MG tablet Take 1 tablet by mouth daily.     metFORMIN (GLUCOPHAGE) 1000 MG tablet Take 500 mg by mouth daily with breakfast.     metFORMIN (GLUCOPHAGE-XR) 500 MG 24 hr tablet Take by mouth.  metoprolol succinate (TOPROL XL) 25 MG 24 hr tablet Take 1 tablet (25 mg total) by mouth daily. 90 tablet 3   Nintedanib (OFEV) 150 MG CAPS Take 1 capsule (150 mg total) by mouth 2 (two) times daily. 120 capsule 5   Omega-3 Fatty Acids (FISH OIL) 1000 MG CAPS Take 1,000 mg by mouth daily.     predniSONE (DELTASONE) 10 MG tablet Take 4 tabs po daily x 3 days; then 3 tabs daily x3 days; then 2 tabs daily x3 days; then 1 tab daily x 3 days; then stop 30 tablet 0   ranitidine (ZANTAC) 75 MG tablet Take 75 mg by mouth 2 (two) times daily.     venlafaxine XR (EFFEXOR-XR) 75 MG 24 hr capsule Take 75 mg by mouth daily with breakfast.      No current facility-administered medications on file prior to visit.    No Known Allergies  Assessment/Plan:  1. Hyperlipidemia - Patient current triglycerides 664 despite atorvastatin 40 and fenofibrate 160mg .  Possibly due to uncontrolled blood sugar and low testosterone levels.  Testosterone is managed by PCP.     Patient reports he has reduced intake of simple sugars and does not drink alcohol.  Patient needs further pharmacologic therapy to reduce triglycerides.  Will increase atorvastatin to 80mg  daily and add on Vascepa 2grams Bid with food.  Will recheck cholesterol in 2-3 months.  Recommended patient contact urologist regarding his as this could be contributing to his yeast infection.   Stop OTC Fish oil Stor atorvastatin 40mg  daily Start atrovastatin 80mg  daily Start Vascepa 2 g BID Recheck lipid panel in 2-3 months  , PharmD, BCACP, CDCES, CPP [address], [address] [place], , [place] Phone: [phone], Fax: [phone]

## 2021-03-23 NOTE — Patient Instructions (Addendum)
It was nice meeting you today  We would like your triglycerides to be less than 150  Continue your fenofibrate 160mg  once a day  We will increase your atorvastatin to 80 mg.  You can take two 40 mg tablets daily until you run out  We will start a new medication called Vascepa.  You will take 2 capsules twice daily with food.  You can discontinue the fish oil capsules you are currently taking.  We will recheck your cholesterol in 2-3 months  Please call with any questions  , PharmD, BCACP, CDCES, CPP [address] [place], [place], [place] Phone: [phone], Fax: [phone]

## 2021-05-22 ENCOUNTER — Telehealth: Payer: Medicare Other | Admitting: Nurse Practitioner

## 2021-05-22 ENCOUNTER — Telehealth: Payer: Self-pay | Admitting: Internal Medicine

## 2021-05-22 ENCOUNTER — Telehealth: Payer: Medicare Other | Admitting: Emergency Medicine

## 2021-05-22 DIAGNOSIS — U071 COVID-19: Secondary | ICD-10-CM | POA: Diagnosis not present

## 2021-05-22 MED ORDER — NIRMATRELVIR/RITONAVIR (PAXLOVID) TABLET (RENAL DOSING): 2.0000 | ORAL_TABLET | Freq: Two times a day (BID) | ORAL | 0 refills | Status: AC

## 2021-05-22 MED ORDER — NIRMATRELVIR/RITONAVIR (PAXLOVID)TABLET: ORAL_TABLET | ORAL | 0 refills | Status: DC

## 2021-05-22 NOTE — Progress Notes (Signed)
Since your COVID-19 symptoms started less than 5 days ago you may need a prescription for FDA-approved treatments (pills or IV therapy), which we cannot prescribe through an e-Visit. The best way for our providers to make a decision about which COVID treatment is right for you is through a Virtual Urgent Care Visit.  If you would like to discuss COVID therapy options with a provider, cancel this e-Visit and access a Virtual Urgent Care Visit from our [facility] menu.   You will not be charged for the e-Visit.    Even if you do not want to have anti-viral medication, based on your age and diagnosis we would prefer to have a video visit to evaluate your status and make the best recommendations for your care at this time.  Thank you  Apolonio Schneiders FNP-c

## 2021-05-22 NOTE — Patient Instructions (Signed)
[facility], thank you for joining Lestine Box, PA-C for today's virtual visit.  While this provider is not your primary care provider (PCP), if your PCP is located in our provider database this encounter information will be shared with them immediately following your visit.  Consent: (Patient) Gary Keller provided verbal consent for this virtual visit at the beginning of the encounter.  Current Medications:  Current Outpatient Medications:    nirmatrelvir/ritonavir EUA, renal dosing, (PAXLOVID) 10 x 150 MG & 10 x 100MG TABS, Take 2 tablets by mouth 2 (two) times daily for 5 days. (Take nirmatrelvir 150 mg one tablet twice daily for 5 days and ritonavir 100 mg one tablet twice daily for 5 days) Patient eGFR is 49, Disp: 20 tablet, Rfl: 0   acetaminophen (TYLENOL) 325 MG tablet, Take by mouth., Disp: , Rfl:    albuterol (VENTOLIN HFA) 108 (90 Base) MCG/ACT inhaler, INHALE 2 PUFFS INTO THE LUNGS EVERY 6 HOURS AS NEEDED FOR WHEEZING OR SHORTNESS OF BREATH, Disp: 6.7 g, Rfl: 11   amLODipine (NORVASC) 5 MG tablet, TAKE 1 TABLET(5 MG) BY MOUTH DAILY, Disp: 90 tablet, Rfl: 3   atorvastatin (LIPITOR) 80 MG tablet, Take 1 tablet (80 mg total) by mouth daily., Disp: 90 tablet, Rfl: 1   cetirizine (ZYRTEC) 10 MG tablet, Take 10 mg by mouth daily. , Disp: , Rfl:    clopidogrel (PLAVIX) 75 MG tablet, Take 75 mg by mouth daily., Disp: , Rfl:    COVID-19 mRNA Vac-TriS, Pfizer, SUSP injection, Inject into the muscle., Disp: 0.3 mL, Rfl: 0   empagliflozin (JARDIANCE) 10 MG TABS tablet, Take by mouth., Disp: , Rfl:    fenofibrate 160 MG tablet, Take 160 mg by mouth daily. , Disp: , Rfl:    fluconazole (DIFLUCAN) 150 MG tablet, Take 1 dose weekly, Disp: , Rfl:    glimepiride (AMARYL) 2 MG tablet, Take by mouth., Disp: , Rfl:    icosapent Ethyl (VASCEPA) 1 g capsule, Take 2 capsules (2 g total) by mouth 2 (two) times daily with a meal., Disp: 360 capsule, Rfl: 1   losartan (COZAAR) 100 MG  tablet, Take 1 tablet (100 mg total) by mouth daily., Disp: 90 tablet, Rfl: 1   metFORMIN (GLUCOPHAGE-XR) 500 MG 24 hr tablet, Take by mouth 2 (two) times daily., Disp: , Rfl:    metoprolol succinate (TOPROL XL) 25 MG 24 hr tablet, Take 1 tablet (25 mg total) by mouth daily., Disp: 90 tablet, Rfl: 3   Nintedanib (OFEV) 150 MG CAPS, Take 1 capsule (150 mg total) by mouth 2 (two) times daily., Disp: 120 capsule, Rfl: 5   ranitidine (ZANTAC) 75 MG tablet, Take 75 mg by mouth 2 (two) times daily., Disp: , Rfl:    venlafaxine XR (EFFEXOR-XR) 75 MG 24 hr capsule, Take 75 mg by mouth daily with breakfast. , Disp: , Rfl:    Medications ordered in this encounter:  Meds ordered this encounter  Medications   nirmatrelvir/ritonavir EUA, renal dosing, (PAXLOVID) 10 x 150 MG & 10 x 100MG TABS    Sig: Take 2 tablets by mouth 2 (two) times daily for 5 days. (Take nirmatrelvir 150 mg one tablet twice daily for 5 days and ritonavir 100 mg one tablet twice daily for 5 days) Patient eGFR is 49    Dispense:  20 tablet    Refill:  0    Order Specific Question:   Supervising Provider    Answer:   Noemi Chapel [[id number]]     *  If you need refills on other medications prior to your next appointment, please contact your pharmacy*  Follow-Up: Call back or seek an in-person evaluation if the symptoms worsen or if the condition fails to improve as anticipated.  Other Instructions  COVID test was positive You should remain isolated in your home for 5 days from symptom onset AND greater than 72 hours after symptoms resolution (absence of fever without the use of fever-reducing medication and improvement in respiratory symptoms), whichever is longer Get plenty of rest and push fluids Paxlovid prescribed, renal dosing given recent eGFR of 49 Use zyrtec for nasal congestion, runny nose, and/or sore throat Use flonase for nasal congestion and runny nose Use medications daily for symptom relief Use OTC medications like  ibuprofen or tylenol as needed fever or pain Follow up with PCP in 1-2 days via phone or e-visit for recheck and to ensure symptoms are improving Call or go to the ED if you have any new or worsening symptoms such as fever, worsening cough, shortness of breath, chest tightness, chest pain, turning blue, changes in mental status, etc...    If you have been instructed to have an in-person evaluation today at a local Urgent Care facility, please use the link below. It will take you to a list of all of our available [facility], including address, phone number and hours of operation. Please do not delay care.  [facility]  If you or a family member do not have a primary care provider, use the link below to schedule a visit and establish care. When you choose a [facility] primary care physician or advanced practice provider, you gain a long-term partner in health. Find a Primary Care Provider  Learn more about [facility]'s in-office and virtual care options: [facility]

## 2021-05-22 NOTE — Progress Notes (Signed)
Virtual Visit Consent   Abdulah Iqbal [facility], you are scheduled for a virtual visit with a [facility] provider today.     Just as with appointments in the office, your consent must be obtained to participate.  Your consent will be active for this visit and any virtual visit you may have with one of our providers in the next 365 days.     If you have a MyChart account, a copy of this consent can be sent to you electronically.  All virtual visits are billed to your insurance company just like a traditional visit in the office.    As this is a virtual visit, video technology does not allow for your provider to perform a traditional examination.  This may limit your provider's ability to fully assess your condition.  If your provider identifies any concerns that need to be evaluated in person or the need to arrange testing (such as labs, EKG, etc.), we will make arrangements to do so.     Although advances in technology are sophisticated, we cannot ensure that it will always work on either your end or our end.  If the connection with a video visit is poor, the visit may have to be switched to a telephone visit.  With either a video or telephone visit, we are not always able to ensure that we have a secure connection.     I need to obtain your verbal consent now.   Are you willing to proceed with your visit today? Yes   Gary Keller has provided verbal consent on 05/22/2021 for a virtual visit (video or telephone).   Lestine Box, [place]   Date: 05/22/2021 11:38 AM   Virtual Visit via Video Note   I, Lestine Box, connected with  Gary Keller  ([id number], [birth year]) on 05/22/21 at 11:30 AM EST by a video-enabled telemedicine application and verified that I am speaking with the correct person using two identifiers.  Location: Patient: Virtual Visit Location Patient: Home Provider: Virtual Visit Location Provider: Home Office   I discussed the limitations of evaluation and  management by telemedicine and the availability of in person appointments. The patient expressed understanding and agreed to proceed.    History of Present Illness: Gary Keller is a 71 y.o. male who presents with cough x 4 days.  Denies sick exposure to COVID, flu or strep.  Reports positive at home covid test.  Has NOT tried OTC medication.  Denies aggravating factors.  Denies previous covid infection in the past.   Denies fever, chills, fatigue, sinus pain, rhinorrhea, sore throat, SOB, wheezing, chest pain, nausea, changes in bowel or bladder habits.    On oxygen 2 L for fibrosis  ROS: As per HPI.  All other pertinent ROS negative.     HPI: HPI  Problems:  Patient Active Problem List   Diagnosis Date Noted   Dysphagia 27/74/1287   Helicobacter pylori (H. pylori) as the cause of diseases classified elsewhere 03/23/2021   Chronic hypoxemic respiratory failure ([place]) 06/22/2020   Therapeutic drug monitoring 03/18/2020   Healthcare maintenance 03/18/2020   Chronic kidney disease (CKD) stage G3b/A2, moderately decreased glomerular filtration rate (GFR) between 30-44 mL/min/1.73 square meter and albuminuria creatinine ratio between 30-299 mg/g ([place]) 11/29/2016   ILD (interstitial lung disease) ([place]) 05/27/2016   GERD (gastroesophageal reflux disease) 05/27/2016   OSA (obstructive sleep apnea) 05/27/2016   Abnormal chest CT 04/22/2016   Lung nodule < 6cm on CT  04/20/2016   History of nicotine dependence 04/14/2016   Atherosclerotic heart disease of native coronary artery without angina pectoris 07/29/2015   Non-insulin dependent type 2 diabetes mellitus ([place])    HTN (hypertension)    Dyslipidemia, goal LDL below 70    Abnormal blood chemistry    Abnormal laboratory test    Chronic seasonal allergic rhinitis    Depression    Encounter for screening for malignant neoplasm of prostate    Non morbid obesity due to excess calories    ABDOMINAL BRUIT 05/20/2009   OBESITY 11/25/2008    Essential hypertension 11/25/2008   CAD S/P PCI 2010 11/25/2008    Allergies: No Known Allergies Medications:  Current Outpatient Medications:    nirmatrelvir/ritonavir EUA, renal dosing, (PAXLOVID) 10 x 150 MG & 10 x 100MG TABS, Take 2 tablets by mouth 2 (two) times daily for 5 days. (Take nirmatrelvir 150 mg one tablet twice daily for 5 days and ritonavir 100 mg one tablet twice daily for 5 days) Patient eGFR is 49, Disp: 20 tablet, Rfl: 0   acetaminophen (TYLENOL) 325 MG tablet, Take by mouth., Disp: , Rfl:    albuterol (VENTOLIN HFA) 108 (90 Base) MCG/ACT inhaler, INHALE 2 PUFFS INTO THE LUNGS EVERY 6 HOURS AS NEEDED FOR WHEEZING OR SHORTNESS OF BREATH, Disp: 6.7 g, Rfl: 11   amLODipine (NORVASC) 5 MG tablet, TAKE 1 TABLET(5 MG) BY MOUTH DAILY, Disp: 90 tablet, Rfl: 3   atorvastatin (LIPITOR) 80 MG tablet, Take 1 tablet (80 mg total) by mouth daily., Disp: 90 tablet, Rfl: 1   cetirizine (ZYRTEC) 10 MG tablet, Take 10 mg by mouth daily. , Disp: , Rfl:    clopidogrel (PLAVIX) 75 MG tablet, Take 75 mg by mouth daily., Disp: , Rfl:    COVID-19 mRNA Vac-TriS, Pfizer, SUSP injection, Inject into the muscle., Disp: 0.3 mL, Rfl: 0   empagliflozin (JARDIANCE) 10 MG TABS tablet, Take by mouth., Disp: , Rfl:    fenofibrate 160 MG tablet, Take 160 mg by mouth daily. , Disp: , Rfl:    fluconazole (DIFLUCAN) 150 MG tablet, Take 1 dose weekly, Disp: , Rfl:    glimepiride (AMARYL) 2 MG tablet, Take by mouth., Disp: , Rfl:    icosapent Ethyl (VASCEPA) 1 g capsule, Take 2 capsules (2 g total) by mouth 2 (two) times daily with a meal., Disp: 360 capsule, Rfl: 1   losartan (COZAAR) 100 MG tablet, Take 1 tablet (100 mg total) by mouth daily., Disp: 90 tablet, Rfl: 1   metFORMIN (GLUCOPHAGE-XR) 500 MG 24 hr tablet, Take by mouth 2 (two) times daily., Disp: , Rfl:    metoprolol succinate (TOPROL XL) 25 MG 24 hr tablet, Take 1 tablet (25 mg total) by mouth daily., Disp: 90 tablet, Rfl: 3   Nintedanib (OFEV) 150  MG CAPS, Take 1 capsule (150 mg total) by mouth 2 (two) times daily., Disp: 120 capsule, Rfl: 5   ranitidine (ZANTAC) 75 MG tablet, Take 75 mg by mouth 2 (two) times daily., Disp: , Rfl:    venlafaxine XR (EFFEXOR-XR) 75 MG 24 hr capsule, Take 75 mg by mouth daily with breakfast. , Disp: , Rfl:   Observations/Objective: pulse ox at home is 97% Patient is well-developed, well-nourished in no acute distress.  Resting comfortably  at home. Has oxygen on 2L Head is normocephalic, atraumatic.  No labored breathing. Speaking in full sentences and tolerating own secretions Speech is clear and coherent with logical content.  Patient is alert and oriented at  baseline.    Assessment and Plan: 1. COVID-19 virus infection   COVID test was positive You should remain isolated in your home for 5 days from symptom onset AND greater than 72 hours after symptoms resolution (absence of fever without the use of fever-reducing medication and improvement in respiratory symptoms), whichever is longer Get plenty of rest and push fluids Paxlovid prescribed, renal dosing given recent eGFR of 49 Use zyrtec for nasal congestion, runny nose, and/or sore throat Use flonase for nasal congestion and runny nose Use medications daily for symptom relief Use OTC medications like ibuprofen or tylenol as needed fever or pain Follow up with PCP in 1-2 days via phone or e-visit for recheck and to ensure symptoms are improving Call or go to the ED if you have any new or worsening symptoms such as fever, worsening cough, shortness of breath, chest tightness, chest pain, turning blue, changes in mental status, etc...    Follow Up Instructions: I discussed the assessment and treatment plan with the patient. The patient was provided an opportunity to ask questions and all were answered. The patient agreed with the plan and demonstrated an understanding of the instructions.  A copy of instructions were sent to the patient via  MyChart unless otherwise noted below.     The patient was advised to call back or seek an in-person evaluation if the symptoms worsen or if the condition fails to improve as anticipated.  Time:  I spent 5-10 minutes with the patient via telehealth technology discussing the above problems/concerns.    Lestine Box, PA-C

## 2021-05-22 NOTE — Telephone Encounter (Signed)
Sick x sev days, home test 1/6  cough/ malaise but no fever/ N or V / sats ok at rest on 2lpm NP/ s/p vax x 4 but not bivalent   Rec paxlovid adjusted for gfr 50 cc/ h based on creat 03/18/21  Will need to hold lipitor or reduce dose as per pharmacy instructions

## 2021-06-10 ENCOUNTER — Ambulatory Visit: Payer: Medicare Other | Admitting: Adult Health

## 2021-06-15 ENCOUNTER — Encounter: Payer: Self-pay | Admitting: Pulmonary Disease

## 2021-06-15 ENCOUNTER — Other Ambulatory Visit: Payer: Self-pay

## 2021-06-15 ENCOUNTER — Ambulatory Visit: Payer: Medicare Other | Admitting: Pulmonary Disease

## 2021-06-15 DIAGNOSIS — J9611 Chronic respiratory failure with hypoxia: Secondary | ICD-10-CM

## 2021-06-15 DIAGNOSIS — J849 Interstitial pulmonary disease, unspecified: Secondary | ICD-10-CM

## 2021-06-15 DIAGNOSIS — G4733 Obstructive sleep apnea (adult) (pediatric): Secondary | ICD-10-CM | POA: Diagnosis not present

## 2021-06-15 MED ORDER — OFEV 100 MG PO CAPS: 100.0000 mg | ORAL_CAPSULE | Freq: Two times a day (BID) | ORAL | 1 refills | Status: DC

## 2021-06-15 NOTE — Progress Notes (Signed)
Subjective:    Patient ID: Gary Keller, male    DOB: [birth year], 71 y.o.   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 He developed increased diarrhea and abdominal pain and stopped taking Ofev about a month ago, symptoms gradually resolved.  LFTs were normal in 03/2021.  His breathing is about the same. He is compliant with portable oxygen in the daytime, uses CPAP at night, does not have oxygen blended in. He developed COVID infection was minimally symptomatic, given paxlovid by on call doc 1/7, reviewed phone note Additional history was obtained from his daughter. Reviewed prior HRCT and PFTs Reviewed PCP notes  Significant tests/ events reviewed PFT 02/2021 >>  FVC 63%, DLCO 12.8/64%   02/2020 >> stable, FVC 64% DLCO 78%   12/2018 >> FVC 66%, TLC 72%, DLCO 64% 02/2018 FVC 66%   05/2017 ratio 88, FVC 71%, DLCO 76%   01/06/17: FVC 2.29 L (67%) FEV1 2.03 L (81%) FEV1/FVC 0.88   DLCO corrected 75%   06/24/16: FVC 2.49 L (76%) FEV1 2.21 L (91%) FEV1/FVC 0.89  negative bronchodilator response TLC 3.77 L (69%)  DLCO uncorrected 66%   6MWT 01/06/17:  Walked 412 meters / Baseline Sat 99% on RA / Nadir Sat 90% on RA @ end of test     PSG(06/07/16):  AHI 15.0 events/hour., nadir satn 78%   >> Optimal CPAP pressure 14 cm    HRCT 06/2020 UIP, unchanged compared to 2021, air trapping raises question of chronic hypersensitivity pneumonitis   HRCT 02/2019  c/w UIP , air trapping raises concern for HP, minimally worse compared to 2018   CT CHEST W/O 07/2016 :  1.1 CM subpleural nodule adjacent major fissure left lower lobe unchanged in size compared with imaging from 2008. Fibrotic changes again noted.     HRCT CHEST W/O 05/2016 UIP pattern.  Subcarinal and precarinal lymph nodes measuring up to 1.1 & 1.2 cm in short axis. 1.1 CM oblong nodule within left lower lobe      CT CHEST W/ 04/2016 : 9 mm left lower lobe nodule. Diffuse interlobular septal thickening with architectural distortion inferiorly with honeycombing versus bronchiectasis and medial right lower lobe.     CT CHEST W/ 07/2007:. Irregular interstitial prominence in addition. Ill-defined cystic lucencies diffusely. Fracture of left 10th rib noted.  Past Medical History:  Diagnosis Date   CAD (coronary artery disease)    Depression    Diabetes mellitus ([place])    Borderline   Encounter for screening for malignant neoplasm of prostate    HTN (hypertension)    Hyperlipidemia    Non morbid obesity due to excess calories    Renal insufficiency    Rhinitis, allergic     Review of Systems neg for any significant sore throat, dysphagia, itching, sneezing, nasal congestion or excess/ purulent secretions, fever, chills, sweats, unintended wt loss, pleuritic or exertional cp, hempoptysis, orthopnea pnd or change in chronic leg swelling. Also denies presyncope, palpitations, heartburn, abdominal pain, nausea, vomiting, diarrhea or change in bowel or urinary habits, dysuria,hematuria, rash, arthralgias, visual complaints, headache, numbness weakness or ataxia.     Objective:   Physical Exam  Gen. Pleasant, obese, in  no distress ENT - no lesions, no post nasal drip Neck: No JVD, no thyromegaly, no carotid bruits Lungs: no use of accessory muscles, no dullness to percussion, bibasal dry rales Cardiovascular: Rhythm regular, heart sounds  normal, no murmurs or gallops, no peripheral edema Musculoskeletal: No deformities, no cyanosis or clubbing , no tremors       Assessment & Plan:

## 2021-06-15 NOTE — Patient Instructions (Signed)
°  X Rx for ofev 100 mg bid  x 1 month with 1 refill  X HRCT in feb

## 2021-06-15 NOTE — Assessment & Plan Note (Signed)
CPAP download was reviewed which shows good compliance on 14 cm  Weight loss encouraged, compliance with goal of at least 4-6 hrs every night is the expectation. Advised against medications with sedative side effects Cautioned against driving when sleepy - understanding that sleepiness will vary on a day to day basis

## 2021-06-15 NOTE — Assessment & Plan Note (Signed)
Continue 2 L oxygen during ambulation. I also asked him to blend 2 L oxygen into his CPAP machine

## 2021-06-15 NOTE — Assessment & Plan Note (Signed)
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 He is willing to get back on Ofev and will restart at lower dose 100 mg twice daily.  If he tolerates for 6 weeks then we will gradually increase to full dose.  Reason for his intolerance after 2 years is unclear, possibly had other medicines messing with GI side effects such as Vascepa and paxlovid  At his next visit, we will also obtain high-resolution CT of the chest and LFTs for therapeutic drug monitoring

## 2021-06-16 ENCOUNTER — Other Ambulatory Visit (HOSPITAL_COMMUNITY): Payer: Self-pay

## 2021-06-16 ENCOUNTER — Telehealth: Payer: Self-pay | Admitting: Pharmacist

## 2021-06-16 NOTE — Telephone Encounter (Signed)
Please see mychart message sent by pt and advise. 

## 2021-06-16 NOTE — Telephone Encounter (Signed)
Per Mills Koller: Reached out and spoke to patient who states he has not received his portion of [employer] application for Ofev. Pt stated that emailing the application to his son is acceptable. Will send pt portion via DocuSign and will place provider portion in Dr. Reginia Naas box for signature.

## 2021-06-16 NOTE — Telephone Encounter (Signed)
Sending to RX RHEUM/PULM

## 2021-06-16 NOTE — Telephone Encounter (Signed)
Reached out and spoke to patient who states he has not received his portion of [employer] renewal application. Pt stated that emailing the application to his son is acceptable. Will send pt portion via DocuSign and will place provider portion in Dr. Reginia Naas box for signature.

## 2021-06-17 ENCOUNTER — Other Ambulatory Visit (HOSPITAL_COMMUNITY): Payer: Self-pay

## 2021-06-22 ENCOUNTER — Other Ambulatory Visit (HOSPITAL_COMMUNITY): Payer: Self-pay

## 2021-06-22 NOTE — Telephone Encounter (Signed)
Called and verified that application had been received by pt's son, however they have not yet had time to fill it out yet. Advised pt to let us know if they have any questions while filling it out and to either mail it back to [place] or drop it off at the clinic once finished. Pt verbalized understanding. Will continue to await f/u. Provider portion already on hand and placed in "Pending info" folder.

## 2021-06-29 ENCOUNTER — Ambulatory Visit (INDEPENDENT_AMBULATORY_CARE_PROVIDER_SITE_OTHER)
Admission: RE | Admit: 2021-06-29 | Discharge: 2021-06-29 | Disposition: A | Discharge status: Home / Self Care | Payer: Medicare Other | Source: Ambulatory Visit | Attending: Pulmonary Disease | Admitting: Pulmonary Disease

## 2021-06-29 ENCOUNTER — Other Ambulatory Visit: Payer: Self-pay

## 2021-06-29 DIAGNOSIS — J849 Interstitial pulmonary disease, unspecified: Secondary | ICD-10-CM

## 2021-07-08 NOTE — Telephone Encounter (Signed)
ATC pt regarding his portion of the PAP application, LVM. Direct phone line provided in case pt had any questions or concerns about it. Will continue to await return of paperwork.

## 2021-07-14 ENCOUNTER — Encounter: Payer: Self-pay | Admitting: Pharmacist

## 2021-07-14 NOTE — Telephone Encounter (Signed)
Submitted Patient Assistance Application to [employer] for OFEV along with provider portion, PA and income documents. Will update patient when we receive a response.  Fax# [phone] Phone# [phone] 

## 2021-07-26 NOTE — Telephone Encounter (Signed)
Per [employer] rep on 07/22/21, application was not received and patient was not found in their system despite being renewal application. Rep search by DOB and zipcode and still was unable to find patient. Refaxed [employer] application for Ofev on 07/22/21 ? ?Phone: [phone] ? ?Chesley Mires, PharmD, MPH, BCPS ?Clinical Pharmacist (Rheumatology and Pulmonology) ?

## 2021-08-23 NOTE — Telephone Encounter (Signed)
Per [facility] Cares rep, pt needs to apply for LIS. A 55-month supply of medication was sent out in early to mid March. Per rep, pt is aware of this. ?

## 2021-08-30 ENCOUNTER — Encounter: Payer: Self-pay | Admitting: Pulmonary Disease

## 2021-08-30 NOTE — Telephone Encounter (Signed)
Dr. Elsworth Soho, please see pt's email. Pt has stopped taking Ofev due to the GI issues. Pt states he has tried several OTC meds to help with no success.  ?

## 2021-09-09 NOTE — Telephone Encounter (Signed)
Per chart review, patient is no longer taking Ofev due to persistent GI side effects - specifically stomach pain. Will close encounter. ? ?Chesley Mires, PharmD, MPH, BCPS ?Clinical Pharmacist (Rheumatology and Pulmonology) ?

## 2021-09-27 ENCOUNTER — Other Ambulatory Visit: Payer: Self-pay | Admitting: *Deleted

## 2021-09-27 DIAGNOSIS — E785 Hyperlipidemia, unspecified: Secondary | ICD-10-CM

## 2021-09-27 DIAGNOSIS — I2510 Atherosclerotic heart disease of native coronary artery without angina pectoris: Secondary | ICD-10-CM

## 2021-09-27 MED ORDER — ICOSAPENT ETHYL 1 G PO CAPS: 2.0000 g | ORAL_CAPSULE | Freq: Two times a day (BID) | ORAL | 0 refills | Status: DC

## 2021-10-04 ENCOUNTER — Other Ambulatory Visit: Payer: Self-pay

## 2021-10-04 DIAGNOSIS — I2510 Atherosclerotic heart disease of native coronary artery without angina pectoris: Secondary | ICD-10-CM

## 2021-10-04 DIAGNOSIS — E785 Hyperlipidemia, unspecified: Secondary | ICD-10-CM

## 2021-10-04 MED ORDER — ATORVASTATIN CALCIUM 80 MG PO TABS: 80.0000 mg | ORAL_TABLET | Freq: Every day | ORAL | 1 refills | Status: DC

## 2021-10-09 ENCOUNTER — Other Ambulatory Visit: Payer: Self-pay | Admitting: Primary Care

## 2021-11-29 ENCOUNTER — Encounter: Payer: Self-pay | Admitting: Pulmonary Disease

## 2021-11-30 MED ORDER — AZITHROMYCIN 250 MG PO TABS
250.0000 mg | ORAL_TABLET | Freq: Every day | ORAL | 0 refills | Status: DC
Start: 2021-11-30 — End: 2021-12-20

## 2021-12-20 ENCOUNTER — Ambulatory Visit: Payer: Medicare Other | Admitting: Adult Health

## 2021-12-20 ENCOUNTER — Encounter: Payer: Self-pay | Admitting: Adult Health

## 2021-12-20 VITALS — BP 126/80 | HR 78 | Temp 97.8°F | Ht 63.0 in | Wt 224.8 lb

## 2021-12-20 DIAGNOSIS — J849 Interstitial pulmonary disease, unspecified: Secondary | ICD-10-CM

## 2021-12-20 DIAGNOSIS — J9611 Chronic respiratory failure with hypoxia: Secondary | ICD-10-CM

## 2021-12-20 DIAGNOSIS — G4733 Obstructive sleep apnea (adult) (pediatric): Secondary | ICD-10-CM

## 2021-12-20 MED ORDER — PREDNISONE 20 MG PO TABS: 20.0000 mg | ORAL_TABLET | Freq: Every day | ORAL | 0 refills | Status: DC

## 2021-12-20 MED ORDER — AZITHROMYCIN 250 MG PO TABS: ORAL_TABLET | ORAL | 0 refills | Status: AC

## 2021-12-20 NOTE — Assessment & Plan Note (Signed)
No increased oxygen demands.  Continue current settings with oxygen 2 L with activity and at bedtime with CPAP.

## 2021-12-20 NOTE — Assessment & Plan Note (Addendum)
Excellent control and compliance on nocturnal CPAP Mild skin irritation secondary to CPAP mask.  Advised to use CPAP liners to see if this will help  Plan  Patient Instructions  Continue on Oxygen 2l/m with activity .  Continue on CPAP At bedtime with Oxygen  Healthy weight loss Do not drive if sleepy  CPAP liners as needed.  Activity as tolerated.  Follow up with Dr. Vassie Loll  In 4 months with PFT (Spirometry with [facility] ) and As needed

## 2021-12-20 NOTE — Progress Notes (Signed)
@Patient  ID: , male    DOB: 05/08/1951, 71 y.o.   MRN: [id number]  Chief Complaint  Patient presents with   Follow-up    Referring provider: [id number], FNP  HPI: 71 year old male followed for obstructive sleep apnea and IPF, chronic respiratory failure on oxygen  TEST/EVENTS :  12/2018 started on OFEV due to drop in lung function - stopped 09/2021    06/2020 started on oxygen PFTs show a slight drop compared to 2018 and CT is worse when compared to 2008 indicating gradual progression  02/2021 >>  FVC 63%, DLCO 12.8/64%   02/2020 >> stable, FVC 64% DLCO 78%   12/2018 >> FVC 66%, TLC 72%, DLCO 64% 02/2018 FVC 66%   05/2017 ratio 88, FVC 71%, DLCO 76%   01/06/17: FVC 2.29 L (67%) FEV1 2.03 L (81%) FEV1/FVC 0.88   DLCO corrected 75%   06/24/16: FVC 2.49 L (76%) FEV1 2.21 L (91%) FEV1/FVC 0.89  negative bronchodilator response TLC 3.77 L (69%)  DLCO uncorrected 66%   08/22/16 01/06/17:  Walked 412 meters / Baseline Sat 99% on RA / Nadir Sat 90% on RA @ end of test     PSG(06/07/16):  AHI 15.0 events/hour., nadir satn 78%   >> Optimal CPAP pressure 14 cm    HRCT 06/2020 UIP, unchanged compared to 2021, air trapping raises question of chronic hypersensitivity pneumonitis   HRCT 02/2019  c/w UIP , air trapping raises concern for HP, minimally worse compared to 2018   CT CHEST W/O 07/2016 :  1.1 CM subpleural nodule adjacent major fissure left lower lobe unchanged in size compared with imaging from 2008. Fibrotic changes again noted.    HRCT CHEST W/O 05/2016 UIP pattern.  Subcarinal and precarinal lymph nodes measuring up to 1.1 & 1.2 cm in short axis. 1.1 CM oblong nodule within left lower lobe      CT CHEST W/ 04/2016 : 9 mm left lower lobe nodule. Diffuse interlobular septal thickening with architectural distortion inferiorly with honeycombing versus bronchiectasis and medial right lower lobe.     CT CHEST W/ 07/2007:. Irregular interstitial prominence in  addition. Ill-defined cystic lucencies diffusely. Fracture of left 10th rib noted.  12/20/2021 Follow up: OSA, IPF, O2 RF  Patient returns for 59-month follow-up.  Patient has underlying sleep apnea.  Patient is on nocturnal CPAP.  Says he is doing very well.  Never sleeps without his CPAP.  Feels that he is benefiting from CPAP with decreased daytime sleepiness.  CPAP download shows excellent compliance with 100% usage.  Daily average usage at 8.5 hours.  Patient is on CPAP 14 cm H2O.  AHI is 1.7/hour with minimum leaks.  Patient has known IPF. Was on OFEV from 2020 till 09/2021 , stopped due to severe GI issues.  On oxygen 2 L with activity.  Patient says overall his activity tolerance is the same.  He has had no increased oxygen demands.  Appetite is fair.  No significant weight loss. Stomach issues resolved after OFEV stopped. High-resolution CT chest June 29, 2021 shows stable chronic interstitial lung disease Patient denies any increased cough. Feels good , much better now off OFEV. Tries to stay active at home. Gets winded with prolonged walking.   Going on Cruise to July 01, 2021 next month     No Known Allergies  Immunization History  Administered Date(s) Administered   DT (Pediatric) 09/30/2004   Fluad Quad(high Dose 65+) 01/10/2019, 03/18/2020, 03/09/2021   Influenza Inj Mdck Quad With  Preservative 05/27/2016, 01/06/2017   Influenza Split 02/09/2015   Influenza, High Dose Seasonal PF 02/13/2018, 01/10/2019, 03/16/2020   Influenza, Seasonal, Injecte, Preservative Fre 05/27/2016   Influenza,inj,Quad PF,6+ Mos 02/17/2014, 05/27/2016, 01/06/2017   PFIZER Comirnaty(Gray Top)Covid-19 Tri-Sucrose Vaccine 12/25/2020   PFIZER(Purple Top)SARS-COV-2 Vaccination 06/24/2019, 07/20/2019, 02/13/2020   Pneumococcal Conjugate-13 02/09/2015   Pneumococcal Polysaccharide-23 12/17/2010, 06/24/2016   Pneumococcal-Unspecified 06/24/2016   Tdap 01/11/2013   Zoster Recombinat (Shingrix) 08/29/2017,  03/16/2018   Zoster, Live 12/17/2010    Past Medical History:  Diagnosis Date   CAD (coronary artery disease)    Depression    Diabetes mellitus (HCC)    Borderline   Encounter for screening for malignant neoplasm of prostate    HTN (hypertension)    Hyperlipidemia    Non morbid obesity due to excess calories    Renal insufficiency    Rhinitis, allergic     Tobacco History: Social History   Tobacco Use  Smoking Status Former   Packs/day: 1.00   Years: 43.00   Total pack years: 43.00   Types: Cigarettes   Start date: 61   Quit date: 05/16/2001   Years since quitting: 20.6  Smokeless Tobacco Never   Counseling given: Not Answered   Outpatient Medications Prior to Visit  Medication Sig Dispense Refill   acetaminophen (TYLENOL) 325 MG tablet Take by mouth.     albuterol (VENTOLIN HFA) 108 (90 Base) MCG/ACT inhaler INHALE 2 PUFFS INTO THE LUNGS EVERY 6 HOURS AS NEEDED FOR WHEEZING OR SHORTNESS OF BREATH 6.7 g 11   amLODipine (NORVASC) 5 MG tablet TAKE 1 TABLET(5 MG) BY MOUTH DAILY 90 tablet 3   atorvastatin (LIPITOR) 80 MG tablet Take 1 tablet (80 mg total) by mouth daily. 90 tablet 1   clopidogrel (PLAVIX) 75 MG tablet Take 75 mg by mouth daily.     empagliflozin (JARDIANCE) 10 MG TABS tablet Take by mouth.     fenofibrate 160 MG tablet Take 160 mg by mouth daily.      fluconazole (DIFLUCAN) 150 MG tablet Take 1 dose weekly     glimepiride (AMARYL) 2 MG tablet Take by mouth.     icosapent Ethyl (VASCEPA) 1 g capsule Take 2 capsules (2 g total) by mouth 2 (two) times daily with a meal. 360 capsule 0   losartan (COZAAR) 100 MG tablet Take 1 tablet (100 mg total) by mouth daily. 90 tablet 1   metFORMIN (GLUCOPHAGE-XR) 500 MG 24 hr tablet Take by mouth 2 (two) times daily.     metoprolol succinate (TOPROL XL) 25 MG 24 hr tablet Take 1 tablet (25 mg total) by mouth daily. 90 tablet 3   venlafaxine XR (EFFEXOR-XR) 75 MG 24 hr capsule Take 75 mg by mouth daily with breakfast.       azithromycin (ZITHROMAX) 250 MG tablet Take 1 tablet (250 mg total) by mouth daily. (Patient not taking: Reported on 12/20/2021) 6 tablet 0   cetirizine (ZYRTEC) 10 MG tablet Take 10 mg by mouth daily.  (Patient not taking: Reported on 12/20/2021)     COVID-19 mRNA Vac-TriS, Pfizer, SUSP injection Inject into the muscle. (Patient not taking: Reported on 12/20/2021) 0.3 mL 0   Nintedanib (OFEV) 100 MG CAPS Take 1 capsule (100 mg total) by mouth 2 (two) times daily. (Patient not taking: Reported on 12/20/2021) 60 capsule 1   Nintedanib (OFEV) 150 MG CAPS Take 1 capsule (150 mg total) by mouth 2 (two) times daily. (Patient not taking: Reported on 12/20/2021) 120 capsule 5  ranitidine (ZANTAC) 75 MG tablet Take 75 mg by mouth 2 (two) times daily. (Patient not taking: Reported on 12/20/2021)     No facility-administered medications prior to visit.     Review of Systems:   Constitutional:   No  weight loss, night sweats,  Fevers, chills,  +fatigue, or  lassitude.  HEENT:   No headaches,  Difficulty swallowing,  Tooth/dental problems, or  Sore throat,                No sneezing, itching, ear ache, nasal congestion, post nasal drip,   CV:  No chest pain,  Orthopnea, PND, swelling in lower extremities, anasarca, dizziness, palpitations, syncope.   GI  No heartburn, indigestion, abdominal pain, nausea, vomiting, diarrhea, change in bowel habits, loss of appetite, bloody stools.   Resp:  No excess mucus, no productive cough,  No non-productive cough,  No coughing up of blood.  No change in color of mucus.  No wheezing.  No chest wall deformity  Skin: no rash or lesions.  GU: no dysuria, change in color of urine, no urgency or frequency.  No flank pain, no hematuria   MS:  No joint pain or swelling.  No decreased range of motion.  No back pain.    Physical Exam  BP 126/80 (BP Location: Left Arm, Cuff Size: Large)   Pulse 78   Temp 97.8 F (36.6 C) (Oral)   Ht 5\' 3"  (1.6 m)   Wt 224 lb 12.8 oz  (102 kg)   SpO2 90%   BMI 39.82 kg/m   GEN: A/Ox3; pleasant , NAD, well nourished , on O2    HEENT:  /AT,  EACs-clear, TMs-wnl, NOSE-clear, THROAT-clear, no lesions, no postnasal drip or exudate noted.   NECK:  Supple w/ fair ROM; no JVD; normal carotid impulses w/o bruits; no thyromegaly or nodules palpated; no lymphadenopathy.    RESP bibasilar crackles  no accessory muscle use, no dullness to percussion  CARD:  RRR, no m/r/g, no peripheral edema, pulses intact, no cyanosis or clubbing.  GI:   Soft & nt; nml bowel sounds; no organomegaly or masses detected.   Musco: Warm bil, no deformities or joint swelling noted.   Neuro: alert, no focal deficits noted.    Skin: Warm, mild skin irritation along face/dermatitis    Lab Results:   BMET   BNP No results found for: "BNP"  ProBNP No results found for: "PROBNP"  Imaging: No results found.       Latest Ref Rng & Units 03/09/2021    1:38 PM 03/12/2020    9:30 AM 09/11/2019    8:41 AM 01/10/2019    9:21 AM 06/13/2017    1:52 PM 01/06/2017    8:33 AM 09/29/2016    8:45 AM  PFT Results  FVC-Pre L 1.97  2.04  2.08  2.22  2.41  2.29  2.31   FVC-Predicted Pre % 63  64  65  66  71  67  67   FVC-Post L   2.21  2.36      FVC-Predicted Post %   69  70      Pre FEV1/FVC % % 85  85  86  86  88  88  87   Post FEV1/FCV % %   85  84      FEV1-Pre L 1.67  1.75  1.79  1.90  2.13  2.03  2.00   FEV1-Predicted Pre % 73  76  77  77  85  81  79   FEV1-Post L   1.88  1.99      DLCO uncorrected ml/min/mmHg 12.82  15.69  13.44  13.36  17.43  17.35  15.83   DLCO UNC% % 64  78  67  64  76  75  69   DLCO corrected ml/min/mmHg 12.82  15.69    16.53  17.30  15.11   DLCO COR %Predicted % 64  78    72  75  65   DLVA Predicted % 110  107  97  97  108  119  104   TLC L   3.48  4.06      TLC % Predicted %   64  72      RV % Predicted %   46  83        No results found for: "NITRICOXIDE"      Assessment & Plan:   OSA (obstructive  sleep apnea) Excellent control and compliance on nocturnal CPAP Mild skin irritation secondary to CPAP mask.  Advised to use CPAP liners to see if this will help  Plan  Patient Instructions  Continue on Oxygen 2l/m with activity .  Continue on CPAP At bedtime with Oxygen  Healthy weight loss Do not drive if sleepy  CPAP liners as needed.  Activity as tolerated.  Follow up with Dr. Vassie Loll  In 4 months with PFT (Spirometry with [facility] ) and As needed           ILD (interstitial lung disease) (HCC) IPF.  Patient has been unable to tolerate Ofev-due to GI side effects.  We discussed alternative with Esbriet.  Patient would like to hold off for right now.  On return visit will check PFTs and decide if he wants to start on Esbriet at that time. Patient education on IPF. Appears to be currently stable.  No change in activity tolerance  Will give Zpack and Prednisone 20mg  x 5d to have on hold for Cruise, advised to take if acute sx, follow up with ship MD . Please contact office for sooner follow up if symptoms do not improve or worsen or seek emergency care    Plan  Patient Instructions  Continue on Oxygen 2l/m with activity .  Continue on CPAP At bedtime with Oxygen  Healthy weight loss Do not drive if sleepy  CPAP liners as needed.  Activity as tolerated.  Follow up with Dr.  In 4 months with PFT (Spirometry with [facility] ) and As needed           Chronic hypoxemic respiratory failure (HCC) No increased oxygen demands.  Continue current settings with oxygen 2 L with activity and at bedtime with CPAP.  OBESITY Healthy weight loss.      [facility], NP 12/20/2021

## 2021-12-20 NOTE — Assessment & Plan Note (Addendum)
IPF.  Patient has been unable to tolerate Ofev-due to GI side effects.  We discussed alternative with Esbriet.  Patient would like to hold off for right now.  On return visit will check PFTs and decide if he wants to start on Esbriet at that time. Patient education on IPF. Appears to be currently stable.  No change in activity tolerance  Will give Zpack and Prednisone 20mg  x 5d to have on hold for Cruise, advised to take if acute sx, follow up with ship MD . Please contact office for sooner follow up if symptoms do not improve or worsen or seek emergency care    Plan  Patient Instructions  Continue on Oxygen 2l/m with activity .  Continue on CPAP At bedtime with Oxygen  Healthy weight loss Do not drive if sleepy  CPAP liners as needed.  Activity as tolerated.  Follow up with Dr.  In 4 months with PFT (Spirometry with [facility] ) and As needed

## 2021-12-20 NOTE — Assessment & Plan Note (Signed)
Healthy weight loss 

## 2021-12-20 NOTE — Patient Instructions (Addendum)
Continue on Oxygen 2l/m with activity .  Continue on CPAP At bedtime with Oxygen  Healthy weight loss Do not drive if sleepy  CPAP liners as needed.  Activity as tolerated.  Follow up with Dr. Vassie Loll  In 4 months with PFT (Spirometry with [facility] ) and As needed

## 2021-12-23 ENCOUNTER — Other Ambulatory Visit: Payer: Self-pay | Admitting: Cardiology

## 2021-12-23 DIAGNOSIS — E785 Hyperlipidemia, unspecified: Secondary | ICD-10-CM

## 2021-12-23 DIAGNOSIS — I2510 Atherosclerotic heart disease of native coronary artery without angina pectoris: Secondary | ICD-10-CM

## 2022-03-04 ENCOUNTER — Other Ambulatory Visit (HOSPITAL_BASED_OUTPATIENT_CLINIC_OR_DEPARTMENT_OTHER): Payer: Self-pay

## 2022-03-04 MED ORDER — COMIRNATY 30 MCG/0.3ML IM SUSY
PREFILLED_SYRINGE | INTRAMUSCULAR | 0 refills | Status: DC
  Filled 2022-03-04: qty 0.3, 1d supply, fill #0

## 2022-04-04 ENCOUNTER — Other Ambulatory Visit: Payer: Self-pay | Admitting: Cardiology

## 2022-04-04 DIAGNOSIS — I2510 Atherosclerotic heart disease of native coronary artery without angina pectoris: Secondary | ICD-10-CM

## 2022-04-04 DIAGNOSIS — E785 Hyperlipidemia, unspecified: Secondary | ICD-10-CM

## 2022-04-05 MED ORDER — ICOSAPENT ETHYL 1 G PO CAPS: 2.0000 g | ORAL_CAPSULE | Freq: Two times a day (BID) | ORAL | 0 refills | Status: DC

## 2022-04-06 ENCOUNTER — Encounter: Payer: Self-pay | Admitting: Cardiology

## 2022-04-14 ENCOUNTER — Other Ambulatory Visit: Payer: Self-pay | Admitting: Cardiology

## 2022-04-14 DIAGNOSIS — I2510 Atherosclerotic heart disease of native coronary artery without angina pectoris: Secondary | ICD-10-CM

## 2022-04-14 DIAGNOSIS — E785 Hyperlipidemia, unspecified: Secondary | ICD-10-CM

## 2022-04-21 ENCOUNTER — Ambulatory Visit (HOSPITAL_BASED_OUTPATIENT_CLINIC_OR_DEPARTMENT_OTHER): Payer: Medicare Other | Admitting: Pulmonary Disease

## 2022-04-21 ENCOUNTER — Encounter (HOSPITAL_BASED_OUTPATIENT_CLINIC_OR_DEPARTMENT_OTHER): Payer: Self-pay | Admitting: Pulmonary Disease

## 2022-04-21 VITALS — BP 118/66 | HR 101 | Temp 98.1°F | Ht 62.0 in | Wt 226.2 lb

## 2022-04-21 DIAGNOSIS — J9611 Chronic respiratory failure with hypoxia: Secondary | ICD-10-CM

## 2022-04-21 DIAGNOSIS — G4733 Obstructive sleep apnea (adult) (pediatric): Secondary | ICD-10-CM

## 2022-04-21 DIAGNOSIS — J849 Interstitial pulmonary disease, unspecified: Secondary | ICD-10-CM

## 2022-04-21 LAB — PULMONARY FUNCTION TEST
DL/VA % pred: 85 %
DL/VA: 3.55 ml/min/mmHg/L
DLCO cor % pred: 47 %
DLCO cor: 9.37 ml/min/mmHg
DLCO unc % pred: 47 %
DLCO unc: 9.37 ml/min/mmHg
FEF 25-75 Post: 2.18 L/sec
FEF 25-75 Pre: 2.34 L/sec
FEF2575-%Change-Post: -6 %
FEF2575-%Pred-Post: 130 %
FEF2575-%Pred-Pre: 140 %
FEV1-%Change-Post: 2 %
FEV1-%Pred-Post: 68 %
FEV1-%Pred-Pre: 66 %
FEV1-Post: 1.52 L
FEV1-Pre: 1.48 L
FEV1FVC-%Change-Post: 0 %
FEV1FVC-%Pred-Pre: 119 %
FEV6-%Change-Post: 1 %
FEV6-%Pred-Post: 60 %
FEV6-%Pred-Pre: 59 %
FEV6-Post: 1.72 L
FEV6-Pre: 1.69 L
FEV6FVC-%Change-Post: 0 %
FEV6FVC-%Pred-Post: 107 %
FEV6FVC-%Pred-Pre: 107 %
FVC-%Change-Post: 1 %
FVC-%Pred-Post: 55 %
FVC-%Pred-Pre: 54 %
FVC-Post: 1.73 L
FVC-Pre: 1.69 L
Post FEV1/FVC ratio: 88 %
Post FEV6/FVC ratio: 100 %
Pre FEV1/FVC ratio: 88 %
Pre FEV6/FVC Ratio: 100 %
RV % pred: 71 %
RV: 1.48 L
TLC % pred: 68 %
TLC: 3.71 L

## 2022-04-21 NOTE — Assessment & Plan Note (Addendum)
We assessed ambulatory saturation on 4 L POC -saturation dropped to 82% and took a few minutes to recover He is able to maintain saturations better with continuous oxygen, we will ask DME to provide.  Unfortunately this means that POC will have to be discontinued

## 2022-04-21 NOTE — Patient Instructions (Signed)
Full PFT Performed Today  

## 2022-04-21 NOTE — Assessment & Plan Note (Signed)
CPAP download was reviewed which shows excellent control of events on 14 cm He is very compliant and CPAP has definitely helped improve his daytime somnolence and fatigue  Weight loss encouraged, compliance with goal of at least 4-6 hrs every night is the expectation. Advised against medications with sedative side effects Cautioned against driving when sleepy - understanding that sleepiness will vary on a day to day basis

## 2022-04-21 NOTE — Patient Instructions (Signed)
X we will start paperwork to get to new medication pirfenidone.  Ambulatory saturation on 4 L POC

## 2022-04-21 NOTE — Progress Notes (Signed)
Full PFT Performed Today  

## 2022-04-21 NOTE — Assessment & Plan Note (Signed)
Lung function has dropped 10% since we stopped Ofev.  I wonder if his intolerance to Ofev after 2 years was related to his starting Vascepa.  Regardless he does not want to start this back again. We will start alternative medication, pirfenidone -I discussed side effects including abdominal issues and photosensitivity We will obtain high-resolution CT chest in February 2024

## 2022-04-21 NOTE — Progress Notes (Signed)
Subjective:    Patient ID: Gary Keller, male    DOB: 01-24-1951, 71 y.o.   MRN: [id number]  HPI  71 yo for FU of OSA and idiopathic pulmonary fibrosis with chronic respiratory failure   12/2018 started on OFEV due to drop in lung function -stopped 09/2021 due to diarrhea   06/2020 started on oxygen PFTs show a slight drop compared to 2018 and CT is worse when compared to 2008 indicating gradual progression  Chief Complaint  Patient presents with   Follow-up    Pt states that he has had a dry/ productive cough for a few months. Pt states he feels like his throat is swelling sometimes.    He was unable to tolerate Ofev due to diarrhea and abdominal pain, this was finally discontinued 09/2021 Vascepa was started about 8 months ago He reports his breathing is slightly worse, needs oxygen at 4 L when he walks Has occasional intermittent cough which comes and goes  He uses CPAP every night.  No problems with mask or pressure  Significant tests/ events reviewed PFT  04/2022 >> FVC 54%, DLCO 9.4/47% 02/2021 >>  FVC 63%, DLCO 12.8/64%   02/2020 >> stable, FVC 64% DLCO 78%   12/2018 >> FVC 66%, TLC 72%, DLCO 64% 02/2018 FVC 66%   05/2017 ratio 88, FVC 71%, DLCO 76%   01/06/17: FVC 2.29 L (67%) FEV1 2.03 L (81%) FEV1/FVC 0.88   DLCO corrected 75%   06/24/16: FVC 2.49 L (76%) FEV1 2.21 L (91%) FEV1/FVC 0.89  negative bronchodilator response TLC 3.77 L (69%)  DLCO uncorrected 66%   01/06/17:  Walked 412 meters / Baseline Sat 99% on RA / Nadir Sat 90% on RA @ end of test     PSG(06/07/16):  AHI 15.0 events/hour., nadir satn 78%   >> Optimal CPAP pressure 14 cm   HRCT 06/2021 unchanged fibrosis "alternative" HRCT 06/2020 UIP, unchanged compared to 2021, air trapping raises question of chronic hypersensitivity pneumonitis   HRCT 02/2019  c/w UIP , air trapping raises concern for HP, minimally worse compared to 2018   HRCT CHEST W/O 05/2016 UIP pattern.  Subcarinal and  precarinal lymph nodes measuring up to 1.1 & 1.2 cm in short axis. 1.1 CM oblong nodule within left lower lobe      CT CHEST W/ 04/2016 : 9 mm left lower lobe nodule. Diffuse interlobular septal thickening with architectural distortion inferiorly with honeycombing versus bronchiectasis and medial right lower lobe.     CT CHEST W/ 07/2007:. Irregular interstitial prominence in addition. Ill-defined cystic lucencies diffusely. Fracture of left 10th rib noted.  Review of Systems neg for any significant sore throat, dysphagia, itching, sneezing, nasal congestion or excess/ purulent secretions, fever, chills, sweats, unintended wt loss, pleuritic or exertional cp, hempoptysis, orthopnea pnd or change in chronic leg swelling. Also denies presyncope, palpitations, heartburn, abdominal pain, nausea, vomiting, diarrhea or change in bowel or urinary habits, dysuria,hematuria, rash, arthralgias, visual complaints, headache, numbness weakness or ataxia.     Objective:   Physical Exam  Gen. Pleasant, obese, in no distress ENT - no lesions, no post nasal drip Neck: No JVD, no thyromegaly, no carotid bruits Lungs: no use of accessory muscles, no dullness to percussion, bibasal dry rales no rhonchi  Cardiovascular: Rhythm regular, heart sounds  normal, no murmurs or gallops, no peripheral edema Musculoskeletal: No deformities, no cyanosis or clubbing , no tremors        Assessment & Plan:

## 2022-04-25 ENCOUNTER — Telehealth: Payer: Self-pay

## 2022-04-25 NOTE — Telephone Encounter (Signed)
Submitted a Prior Authorization request to [facility] for ESBRIET (brand name) via CoverMyMeds. Will update once we receive a response.  Key: [id number]  Chesley Mires, PharmD, MPH, BCPS, CPP Clinical Pharmacist (Rheumatology and Pulmonology)

## 2022-04-25 NOTE — Telephone Encounter (Signed)
Pt is transitioning from Ofev to Esbriet due to GI side effects.  Received New start paperwork for ESBRIET. Will update as we work through the benefits process.

## 2022-04-28 ENCOUNTER — Other Ambulatory Visit (HOSPITAL_COMMUNITY): Payer: Self-pay

## 2022-04-28 NOTE — Telephone Encounter (Signed)
Received notification from [facility] regarding a prior authorization for ESBRIET. Authorization has been APPROVED from 04/25/2022 to 05/16/2023. Approval letter sent to scan center.  Per test claim, copay for 30 days supply is $1854.30 Per test claim, cpay for 30 days of generic is $286.51  Patient can fill through [facility]: [phone]   Authorization # [id number] Phone # [phone]  Submitted Patient Assistance Application to [facility] for ESBRIET along with patient portion, provider portion, PA, med list, and insurance card copy. Will update patient when we receive a response.  Fax# [phone] Phone# [phone]  Chesley Mires, PharmD, MPH, BCPS, CPP Clinical Pharmacist (Rheumatology and Pulmonology)

## 2022-04-29 ENCOUNTER — Encounter (HOSPITAL_BASED_OUTPATIENT_CLINIC_OR_DEPARTMENT_OTHER): Payer: Self-pay | Admitting: Pulmonary Disease

## 2022-05-02 NOTE — Telephone Encounter (Signed)
Please review denial letter

## 2022-05-03 ENCOUNTER — Ambulatory Visit (HOSPITAL_COMMUNITY)
Admission: RE | Admit: 2022-05-03 | Discharge: 2022-05-03 | Disposition: A | Discharge status: Home / Self Care | Payer: Medicare Other | Source: Ambulatory Visit | Attending: Pulmonary Disease | Admitting: Pulmonary Disease

## 2022-05-03 DIAGNOSIS — J849 Interstitial pulmonary disease, unspecified: Secondary | ICD-10-CM | POA: Diagnosis present

## 2022-05-05 ENCOUNTER — Other Ambulatory Visit: Payer: Self-pay

## 2022-05-05 DIAGNOSIS — J9611 Chronic respiratory failure with hypoxia: Secondary | ICD-10-CM

## 2022-05-05 DIAGNOSIS — J849 Interstitial pulmonary disease, unspecified: Secondary | ICD-10-CM

## 2022-05-06 NOTE — Telephone Encounter (Signed)
Received a fax from  [place] regarding an approval for ESBRIET patient assistance from 05/05/2022 until patient discontinues treatment, has change in insurance or financial information that deems him ineligible for program. Approval letter sent to scan center.  He will have to first complete onboarding with [employer] prior to shipment of Esbriet being scheduled.  [employer] Phone number: [phone] [facility]: [phone]  I spoke with patient's son Maisie Fus and advised of approval for PAP. Provided him with [facility] phone number to complete onboarding process. Will f/u to ensure shipment is received.  Chesley Mires, PharmD, MPH, BCPS, CPP Clinical Pharmacist (Rheumatology and Pulmonology)

## 2022-06-07 ENCOUNTER — Encounter (HOSPITAL_BASED_OUTPATIENT_CLINIC_OR_DEPARTMENT_OTHER): Payer: Self-pay | Admitting: Family

## 2022-06-07 ENCOUNTER — Ambulatory Visit (HOSPITAL_BASED_OUTPATIENT_CLINIC_OR_DEPARTMENT_OTHER): Payer: Medicare Other | Admitting: Family

## 2022-06-07 VITALS — BP 136/82 | HR 98 | Ht 62.0 in | Wt 224.4 lb

## 2022-06-07 DIAGNOSIS — E785 Hyperlipidemia, unspecified: Secondary | ICD-10-CM | POA: Diagnosis not present

## 2022-06-07 DIAGNOSIS — G4733 Obstructive sleep apnea (adult) (pediatric): Secondary | ICD-10-CM | POA: Diagnosis not present

## 2022-06-07 DIAGNOSIS — I25118 Atherosclerotic heart disease of native coronary artery with other forms of angina pectoris: Secondary | ICD-10-CM | POA: Diagnosis not present

## 2022-06-07 DIAGNOSIS — I10 Essential (primary) hypertension: Secondary | ICD-10-CM

## 2022-06-07 NOTE — Progress Notes (Signed)
Office Visit    Patient Name: Gary Keller Date of Encounter: 06/07/2022  PCP:  Vicenta Aly, [facility]  Cardiologist:  Kirk Ruths, MD  Advanced Practice Provider:  No care team member to display Electrophysiologist:  None      Chief Complaint    Gary Keller is a 72 y.o. male presents today for follow up of CAD   Past Medical History    Past Medical History:  Diagnosis Date   CAD (coronary artery disease)    Depression    Diabetes mellitus ([place])    Borderline   Encounter for screening for malignant neoplasm of prostate    HTN (hypertension)    Hyperlipidemia    Non morbid obesity due to excess calories    Renal insufficiency    Rhinitis, allergic    Past Surgical History:  Procedure Laterality Date   APPENDECTOMY     COLONOSCOPY WITH ESOPHAGOGASTRODUODENOSCOPY (EGD)     LEFT HEART CATH  2006   Stent x3    Allergies  No Known Allergies  History of Present Illness    Gary Keller is a 72 y.o. male with a hx of CAD (2010 PCI posterior lateral and RCA and PCI of PDA), pulmonary fibrosis, ILD, OSA, HTN, HLD last seen 03/09/21 by Dr. Stanford Breed.  Coronary artery disease dates back to 2010 with PCI posterior lateral and RCA as well as PCI of PDA. These were all DES. Nuclear study 2015 LVEF 56^. Abdominal US 04/2016 no aneurysm. CT 06/2020 pulmonary fibrosis. Follows with pulmonology.   He presents today for follow up with his wife. He notes since last seen he enjoyed  a cruise to [place] with his wife. He notes only gets chest pain when his oxygen drops low. Wears oxygen routinely and hooks it up to his CPAP at night. His exertional dyspnea is stable.  No edema, orthopnea, PND. Reports no palpitations.    EKGs/Labs/Other Studies Reviewed:   The following studies were reviewed today:  EKG:  EKG is  ordered today.  The ekg ordered today demonstrates NSR 98 bpm with no acute ST/T wave changes.   Recent  Labs: No results found for requested labs within last 365 days.  Recent Lipid Panel    Component Value Date/Time   CHOL 147 03/18/2021 0921   TRIG 664 (HH) 03/18/2021 0921   HDL 19 (L) 03/18/2021 0921   CHOLHDL 7.7 (H) 03/18/2021 0921   CHOLHDL 5 12/02/2009 0000   VLDL 58.2 (H) 12/02/2009 0000   LDLCALC 36 03/18/2021 0921   LDLDIRECT 45.5 12/02/2009 0000   Home Medications   Current Meds  Medication Sig   acetaminophen (TYLENOL) 325 MG tablet Take by mouth.   albuterol (VENTOLIN HFA) 108 (90 Base) MCG/ACT inhaler INHALE 2 PUFFS INTO THE LUNGS EVERY 6 HOURS AS NEEDED FOR WHEEZING OR SHORTNESS OF BREATH   amLODipine (NORVASC) 5 MG tablet TAKE 1 TABLET(5 MG) BY MOUTH DAILY   atorvastatin (LIPITOR) 80 MG tablet TAKE 1 TABLET(80 MG) BY MOUTH DAILY   clopidogrel (PLAVIX) 75 MG tablet Take 75 mg by mouth daily.   COVID-19 mRNA vaccine 2023-2024 (COMIRNATY) syringe Inject into the muscle.   empagliflozin (JARDIANCE) 10 MG TABS tablet Take by mouth.   fenofibrate 160 MG tablet Take 160 mg by mouth daily.    fluconazole (DIFLUCAN) 150 MG tablet Take 1 dose weekly   glimepiride (AMARYL) 2 MG tablet Take by mouth.   icosapent  Ethyl (VASCEPA) 1 g capsule Take 2 capsules (2 g total) by mouth 2 (two) times daily. APPOINTMENT NEEDED FOR FUTURE REFILLS. 1ST ATTEMPT   losartan (COZAAR) 100 MG tablet Take 1 tablet (100 mg total) by mouth daily.   metFORMIN (GLUCOPHAGE-XR) 500 MG 24 hr tablet Take by mouth 2 (two) times daily.   metoprolol succinate (TOPROL XL) 25 MG 24 hr tablet Take 1 tablet (25 mg total) by mouth daily.   Pirfenidone (ESBRIET) 267 MG CAPS Take by mouth 2 (two) times daily. Take 3 tablets 2 times a day   venlafaxine XR (EFFEXOR-XR) 75 MG 24 hr capsule Take 75 mg by mouth daily with breakfast.      Review of Systems      All other systems reviewed and are otherwise negative except as noted above.  Physical Exam    VS:  BP 136/82   Pulse 98   Ht 5\' 2"  (1.575 m)   Wt 224 lb  6.4 oz (101.8 kg)   SpO2 94%   BMI 41.04 kg/m  , BMI Body mass index is 41.04 kg/m.  Wt Readings from Last 3 Encounters:  06/07/22 224 lb 6.4 oz (101.8 kg)  04/21/22 226 lb 3.2 oz (102.6 kg)  12/20/21 224 lb 12.8 oz (102 kg)     GEN: Well nourished, well developed, in no acute distress. HEENT: normal. Neck: Supple, no JVD, carotid bruits, or masses. Cardiac: RRR, no murmurs, rubs, or gallops. No clubbing, cyanosis, edema.  Radials/PT 2+ and equal bilaterally.  Respiratory:  Respirations regular and unlabored, clear to auscultation bilaterally. GI: Soft, nontender, nondistended. MS: No deformity or atrophy. Skin: Warm and dry, no rash. Neuro:  Strength and sensation are intact. Psych: Normal affect.  Assessment & Plan    CAD - Stable with no anginal symptoms. No indication for ischemic evaluation.  GDMT atorvastatin, plavix, jardiance, vascepa, metoprolol. Heart healthy diet and regular cardiovascular exercise encouraged.    HTN - BP reasonably well controlled. Continue Amlodipine 5mg  QD, Losartan 100mg  QD, Toprol 25mg  QD. Discussed to monitor BP at home at least 2 hours after medications and sitting for 5-10 minutes. He will report if BP consistently >130/80.   HLD - Continue Atorvastatin, Vascepa. Notes he has only been taking 1 tablet and will increase to 2 tabs BID as prescribed as triglycerides most recently 600.  Obesity - Weight loss via diet and exercise encouraged. Discussed the impact being overweight would have on cardiovascular risk. Discussed pulmonary rehab and he wishes to think about it.  ILD - On home o2. Follows with pulmonology. OSA - CPAP compliance encouraged.          Disposition: Follow up in 1 year(s) with Kirk Ruths, MD or APP.  Signed, Loel Dubonnet, NP 06/07/2022, 3:27 PM [place]

## 2022-06-07 NOTE — Patient Instructions (Signed)
Medication Instructions:  Your physician has recommended you make the following change in your medication:   Resume Vascepa 2 tablets twice daily  *If you need a refill on your cardiac medications before your next appointment, please call your pharmacy*  Follow-Up: At [facility], you and your health needs are our priority.  As part of our continuing mission to provide you with exceptional heart care, we have created designated Provider Care Teams.  These Care Teams include your primary Cardiologist (physician) and Advanced Practice Providers (APPs -  Physician Assistants and Nurse Practitioners) who all work together to provide you with the care you need, when you need it.  We recommend signing up for the patient portal called "MyChart".  Sign up information is provided on this After Visit Summary.  MyChart is used to connect with patients for Virtual Visits (Telemedicine).  Patients are able to view lab/test results, encounter notes, upcoming appointments, etc.  Non-urgent messages can be sent to your provider as well.   To learn more about what you can do with MyChart, go to [email or web address].    Your next appointment:   1 year(s)  Provider:   Kirk Ruths, MD     Other Instructions Let us know if you want to participate in PREP program at the [facility] or in pulmonary rehab.

## 2022-06-12 ENCOUNTER — Encounter (HOSPITAL_BASED_OUTPATIENT_CLINIC_OR_DEPARTMENT_OTHER): Payer: Self-pay | Admitting: Family

## 2022-06-16 ENCOUNTER — Ambulatory Visit (HOSPITAL_BASED_OUTPATIENT_CLINIC_OR_DEPARTMENT_OTHER): Payer: Medicare Other | Admitting: Family

## 2022-06-23 ENCOUNTER — Other Ambulatory Visit: Payer: Self-pay | Admitting: Pharmacist

## 2022-06-23 DIAGNOSIS — J849 Interstitial pulmonary disease, unspecified: Secondary | ICD-10-CM

## 2022-06-23 DIAGNOSIS — Z5181 Encounter for therapeutic drug level monitoring: Secondary | ICD-10-CM

## 2022-06-23 MED ORDER — PIRFENIDONE 267 MG PO CAPS: 801.0000 mg | ORAL_CAPSULE | Freq: Three times a day (TID) | ORAL | 0 refills | Status: DC

## 2022-06-23 NOTE — Telephone Encounter (Signed)
Refill sent for ESBRIET to [facility] ([facility]) for Esbriet: [phone]  Dose: 801 mg three times daily  Last OV: 04/21/2022 Provider: Dr. Elsworth Soho  Next OV: 07/26/2022  Due for LFT check at next [place], PharmD, MPH, BCPS Clinical Pharmacist (Rheumatology and Pulmonology)

## 2022-06-24 ENCOUNTER — Encounter (HOSPITAL_BASED_OUTPATIENT_CLINIC_OR_DEPARTMENT_OTHER): Payer: Self-pay | Admitting: Pulmonary Disease

## 2022-06-28 NOTE — Telephone Encounter (Signed)
Dr. Elsworth Soho, please advise on pts message regarding a wheelchair order. Thanks.

## 2022-07-07 ENCOUNTER — Other Ambulatory Visit: Payer: Self-pay | Admitting: Cardiology

## 2022-07-07 DIAGNOSIS — I2510 Atherosclerotic heart disease of native coronary artery without angina pectoris: Secondary | ICD-10-CM

## 2022-07-07 DIAGNOSIS — E785 Hyperlipidemia, unspecified: Secondary | ICD-10-CM

## 2022-07-07 MED ORDER — ICOSAPENT ETHYL 1 G PO CAPS: 2.0000 g | ORAL_CAPSULE | Freq: Two times a day (BID) | ORAL | 3 refills | Status: DC

## 2022-07-22 ENCOUNTER — Encounter (HOSPITAL_BASED_OUTPATIENT_CLINIC_OR_DEPARTMENT_OTHER): Payer: Self-pay | Admitting: Pulmonary Disease

## 2022-07-22 NOTE — Telephone Encounter (Signed)
Please advise 

## 2022-07-26 ENCOUNTER — Ambulatory Visit (HOSPITAL_BASED_OUTPATIENT_CLINIC_OR_DEPARTMENT_OTHER): Payer: Medicare Other | Admitting: Pulmonary Disease

## 2022-07-26 ENCOUNTER — Encounter (HOSPITAL_BASED_OUTPATIENT_CLINIC_OR_DEPARTMENT_OTHER): Payer: Self-pay | Admitting: Pulmonary Disease

## 2022-07-26 ENCOUNTER — Other Ambulatory Visit: Payer: Self-pay | Admitting: Pulmonary Disease

## 2022-07-26 VITALS — BP 130/64 | HR 86 | Temp 97.7°F | Ht 62.0 in | Wt 222.2 lb

## 2022-07-26 DIAGNOSIS — J849 Interstitial pulmonary disease, unspecified: Secondary | ICD-10-CM

## 2022-07-26 DIAGNOSIS — Z5181 Encounter for therapeutic drug level monitoring: Secondary | ICD-10-CM | POA: Diagnosis not present

## 2022-07-26 DIAGNOSIS — G4733 Obstructive sleep apnea (adult) (pediatric): Secondary | ICD-10-CM

## 2022-07-26 DIAGNOSIS — J9611 Chronic respiratory failure with hypoxia: Secondary | ICD-10-CM | POA: Diagnosis not present

## 2022-07-26 MED ORDER — PREDNISONE 10 MG PO TABS: ORAL_TABLET | ORAL | 0 refills | Status: DC

## 2022-07-26 NOTE — Assessment & Plan Note (Addendum)
Compliant with CPAP and this has certainly helped improve his daytime somnolence and fatigue CPAP download on 14 cm shows excellent control of events in great compliance without a single missed night, minimal leak  Weight loss encouraged, compliance with goal of at least 4-6 hrs every night is the expectation. Advised against medications with sedative side effects Cautioned against driving when sleepy - understanding that sleepiness will vary on a day to day basis

## 2022-07-26 NOTE — Assessment & Plan Note (Signed)
We will provide him with a snuffling tank so that he can use higher levels of oxygen as needed to maintain saturation at 90% Please daughter will try to get him an Oxymizer

## 2022-07-26 NOTE — Patient Instructions (Addendum)
  X Prednisone 10 mg tabs  Take 2 tabs daily with food x 7ds, then 1 tab daily with food x  3 weeks # 60   X LFTs , BMET  X self fill O2 machine [employer]

## 2022-07-26 NOTE — Progress Notes (Signed)
Subjective:    Patient ID: Gary Keller, male    DOB: [birth year], 72 y.o.   MRN: [id number]  HPI  72 yo for FU of OSA and idiopathic pulmonary fibrosis with chronic respiratory failure   12/2018 started on OFEV due to drop in lung function -stopped 09/2021 due to diarrhea   06/2020 started on oxygen PFTs show a slight drop compared to 2018 and CT is worse when compared to 2008 indicating gradual progression  Chief Complaint  Patient presents with   Follow-up    Pt states he feels like his breathing has become worse since last visit. States that his saturations have been dropping a lot quicker before with short walking distances. Also has an occasional cough.    14-monthfollow-up visit. He arrives on oxygen accompanied by his daughter [name] His breathing appears worse He has been requiring 4 L continuous oxygen and on walking his oxygen saturation is dropping further down to the 70s.  He has been increasing it to 5 L and  6L on occasion.  He asks [employer] [place] something to [employer] for the self fill that we can refill my tanks at home instead of having to take them there or having someone bring other tanks out and exchange.   He reports a sensation of choking when he first puts his CPAP on but then this settles down and is able to use his CPAP for the rest of the night.  He has oxygen blended into his CPAP machine. We reviewed HRCT chest. There is no pedal edema He is tolerating pirfenidone well and does not have nausea or abdominal pain   Significant tests/ events reviewed PFT   04/2022 >> FVC 54%, DLCO 9.4/47% 02/2021 >>  FVC 63%, DLCO 12.8/64%   02/2020 >> stable, FVC 64% DLCO 78%   12/2018 >> FVC 66%, TLC 72%, DLCO 64% 02/2018 FVC 66%   05/2017 ratio 88, FVC 71%, DLCO 76%   01/06/17: FVC 2.29 L (67%) FEV1 2.03 L (81%) FEV1/FVC 0.88   DLCO corrected 75%   06/24/16: FVC 2.49 L (76%) FEV1 2.21 L (91%) FEV1/FVC 0.89  negative bronchodilator response TLC 3.77 L (69%)   DLCO uncorrected 66%   6MWT 01/06/17:  Walked 412 meters / Baseline Sat 99% on RA / Nadir Sat 90% on RA @ end of test     PSG(06/07/16):  AHI 15.0 events/hour., nadir satn 78%   >> Optimal CPAP pressure 14 cm    HRCT 04/2022 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 No appreciable interval progression since 06/29/2021 CT. Mild progression since baseline 05/31/2016    HRCT 06/2021 unchanged fibrosis "alternative" HRCT 06/2020 UIP, unchanged compared to 2021, air trapping raises question of chronic hypersensitivity pneumonitis   HRCT 02/2019  c/w UIP , air trapping raises concern for HP, minimally worse compared to 2018   HRCT CHEST W/O 05/2016 UIP pattern.  Subcarinal and precarinal lymph nodes measuring up to 1.1 & 1.2 cm in short axis. 1.1 CM oblong nodule within left lower lobe      CT CHEST W/ 04/2016 : 9 mm left lower lobe nodule. Diffuse interlobular septal thickening with architectural distortion inferiorly with honeycombing versus bronchiectasis and medial right lower lobe.     CT CHEST W/ 07/2007:. Irregular interstitial prominence in addition. Ill-defined cystic lucencies diffusely. Fracture of left 10th rib noted.  Review of Systems neg for any significant sore throat, dysphagia, itching, sneezing, nasal congestion or excess/ purulent secretions, fever,  chills, sweats, unintended wt loss, pleuritic or exertional cp, hempoptysis, orthopnea pnd or change in chronic leg swelling. Also denies presyncope, palpitations, heartburn, abdominal pain, nausea, vomiting, diarrhea or change in bowel or urinary habits, dysuria,hematuria, rash, arthralgias, visual complaints, headache, numbness weakness or ataxia.     Objective:   Physical Exam  Gen. Pleasant, obese, in no distress ENT - no lesions, no post nasal drip Neck: No JVD, no thyromegaly, no carotid bruits Lungs: no use of accessory muscles, no dullness to percussion, bibasal rales no rhonchi   Cardiovascular: Rhythm regular, heart sounds  normal, no murmurs or gallops, no peripheral edema Musculoskeletal: No deformities, no cyanosis or clubbing , no tremors       Assessment & Plan:    Therapeutic drug monitoring -tolerating pirfenidone well. Check LFTs today

## 2022-07-26 NOTE — Assessment & Plan Note (Signed)
Does not appear to be worse on high-resolution CT chest in December compared to 2023 but mild progression since 2018. Although honeycombing is present, absence of apical basilar gradient and air presence of air trapping and minimal progression seems to favor chronic HP. Will undertake a trial of low-dose prednisone and see if he has a response since his breathing is worse. If he improves, then we may have to consider alternative steroid sparing medication such as Imuran or CellCept. Will start with prednisone 20 mg for a week and cut him down to 10 mg, discussed side effects including hypoglycemia and weight gain

## 2022-07-27 LAB — HEPATIC FUNCTION PANEL
ALT: 24 IU/L (ref 0–44)
AST: 22 IU/L (ref 0–40)
Albumin: 4.4 g/dL (ref 3.8–4.8)
Alkaline Phosphatase: 126 IU/L — ABNORMAL HIGH (ref 44–121)
Bilirubin Total: 0.4 mg/dL (ref 0.0–1.2)
Bilirubin, Direct: 0.2 mg/dL (ref 0.00–0.40)
Total Protein: 7.3 g/dL (ref 6.0–8.5)

## 2022-07-27 LAB — BASIC METABOLIC PANEL
BUN/Creatinine Ratio: 16 (ref 10–24)
BUN: 25 mg/dL (ref 8–27)
CO2: 23 mmol/L (ref 20–29)
Calcium: 9.6 mg/dL (ref 8.6–10.2)
Chloride: 99 mmol/L (ref 96–106)
Creatinine, Ser: 1.61 mg/dL — ABNORMAL HIGH (ref 0.76–1.27)
Glucose: 176 mg/dL — ABNORMAL HIGH (ref 70–99)
Potassium: 4.9 mmol/L (ref 3.5–5.2)
Sodium: 138 mmol/L (ref 134–144)
eGFR: 45 mL/min/{1.73_m2} — ABNORMAL LOW (ref >59)

## 2022-07-28 ENCOUNTER — Encounter (HOSPITAL_BASED_OUTPATIENT_CLINIC_OR_DEPARTMENT_OTHER): Payer: Self-pay | Admitting: Pulmonary Disease

## 2022-07-29 NOTE — Telephone Encounter (Signed)
Mychart message sent by pt:  Gary Keller  P [facility] Clinical Pool (supporting Kara Mead V, MD)13 hours ago (7:05 PM)    Can vascepa be what is causing me to cough more?    Dr. Elsworth Soho, please advise.

## 2022-08-01 ENCOUNTER — Encounter (HOSPITAL_BASED_OUTPATIENT_CLINIC_OR_DEPARTMENT_OTHER): Payer: Self-pay | Admitting: Pulmonary Disease

## 2022-08-01 NOTE — Telephone Encounter (Signed)
[place], [place] long hold time, will try again

## 2022-08-02 ENCOUNTER — Other Ambulatory Visit: Payer: Self-pay | Admitting: Pharmacist

## 2022-08-02 DIAGNOSIS — J849 Interstitial pulmonary disease, unspecified: Secondary | ICD-10-CM

## 2022-08-02 MED ORDER — PIRFENIDONE 267 MG PO CAPS: 801.0000 mg | ORAL_CAPSULE | Freq: Three times a day (TID) | ORAL | 1 refills | Status: DC

## 2022-08-02 NOTE — Telephone Encounter (Signed)
Rx for Esbriet sent to [facility]. Mychart message sent to patient  Knox Saliva, PharmD, MPH, BCPS, CPP Clinical Pharmacist (Rheumatology and Pulmonology)

## 2022-08-02 NOTE — Telephone Encounter (Signed)
Will forward to pharmacy team to help with Esbriet refill. Will send high priority as pt is out of med.

## 2022-08-12 ENCOUNTER — Other Ambulatory Visit: Payer: Self-pay | Admitting: Gastroenterology

## 2022-08-12 DIAGNOSIS — R1310 Dysphagia, unspecified: Secondary | ICD-10-CM

## 2022-08-22 ENCOUNTER — Other Ambulatory Visit: Payer: Self-pay | Admitting: Gastroenterology

## 2022-08-22 ENCOUNTER — Ambulatory Visit
Admission: RE | Admit: 2022-08-22 | Discharge: 2022-08-22 | Disposition: A | Discharge status: Home / Self Care | Payer: Medicare Other | Source: Ambulatory Visit | Attending: Gastroenterology | Admitting: Gastroenterology

## 2022-08-22 DIAGNOSIS — R1310 Dysphagia, unspecified: Secondary | ICD-10-CM

## 2022-08-25 ENCOUNTER — Ambulatory Visit (INDEPENDENT_AMBULATORY_CARE_PROVIDER_SITE_OTHER): Payer: Medicare Other

## 2022-08-25 ENCOUNTER — Encounter (HOSPITAL_BASED_OUTPATIENT_CLINIC_OR_DEPARTMENT_OTHER): Payer: Self-pay | Admitting: Pulmonary Disease

## 2022-08-25 ENCOUNTER — Ambulatory Visit (HOSPITAL_BASED_OUTPATIENT_CLINIC_OR_DEPARTMENT_OTHER): Payer: Medicare Other | Admitting: Pulmonary Disease

## 2022-08-25 VITALS — BP 126/68 | HR 90 | Temp 99.0°F | Ht 62.0 in | Wt 220.2 lb

## 2022-08-25 DIAGNOSIS — R059 Cough, unspecified: Secondary | ICD-10-CM

## 2022-08-25 DIAGNOSIS — J9611 Chronic respiratory failure with hypoxia: Secondary | ICD-10-CM | POA: Diagnosis not present

## 2022-08-25 DIAGNOSIS — G4733 Obstructive sleep apnea (adult) (pediatric): Secondary | ICD-10-CM | POA: Diagnosis not present

## 2022-08-25 DIAGNOSIS — J849 Interstitial pulmonary disease, unspecified: Secondary | ICD-10-CM

## 2022-08-25 MED ORDER — PREDNISONE 10 MG PO TABS: 10.0000 mg | ORAL_TABLET | Freq: Every day | ORAL | 2 refills | Status: DC

## 2022-08-25 NOTE — Assessment & Plan Note (Signed)
Continue using 4 L of oxygen at rest 5 to 6 L on walking. We will reassess at every visit to see if oxygen requirements change

## 2022-08-25 NOTE — Assessment & Plan Note (Addendum)
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  LFTs normal during last visit 07/2022. Most recent HRCT seems to suggest chronic HP due to air trapping.  He has had a remarkable response to prednisone and will continue at 10 mg.  We discussed side effects of long-term prednisone-he is willing to accept that since he is breathing better.   If response is sustained we may consider steroid sparing agent such as Imuran or CellCept.

## 2022-08-25 NOTE — Patient Instructions (Signed)
  X CXR today  X refill on prednisone  # 60 x 2

## 2022-08-25 NOTE — Assessment & Plan Note (Signed)
CPAP download shows excellent control of events on 14 cm.  He is very compliant minimal leak.  CPAP is only helped improve his daytime somnolence and fatigue  Weight loss encouraged, compliance with goal of at least 4-6 hrs every night is the expectation. Advised against medications with sedative side effects Cautioned against driving when sleepy - understanding that sleepiness will vary on a day to day basis

## 2022-08-25 NOTE — Progress Notes (Signed)
Subjective:    Patient ID: Gary Keller, male    DOB: 08-17-1950, 72 y.o.   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  Due to clinical progression and worsening hypoxia on his last office visit 07/2022 we undertook empiric trial of prednisone 20 mg for 1 week followed by 10 mg daily.  He feels remarkably improved.  Feels like he has more energy.  Oxygen requirements have slightly decreased he is able to tolerate 4 L at rest and only desaturates to 85% again on 4 L, using 5 to 6 L on walking. No significant pedal edema, sugars are controlled Weight is unchanged within the last month  Chest x-ray shows unchanged bilateral infiltrates  Significant tests/ events reviewed  Serology neg 06/2016 PFT   04/2022 >> FVC 54%, DLCO 9.4/47% 02/2021 >>  FVC 63%, DLCO 12.8/64%   02/2020 >> stable, FVC 64% DLCO 78%   12/2018 >> FVC 66%, TLC 72%, DLCO 64% 02/2018 FVC 66%   05/2017 ratio 88, FVC 71%, DLCO 76%   01/06/17: FVC 2.29 L (67%) FEV1 2.03 L (81%) FEV1/FVC 0.88   DLCO corrected 75%   06/24/16: FVC 2.49 L (76%) FEV1 2.21 L (91%) FEV1/FVC 0.89  negative bronchodilator response TLC 3.77 L (69%)  DLCO uncorrected 66%   01/06/17:  Walked 412 meters / Baseline Sat 99% on RA / Nadir Sat 90% on RA @ end of test     PSG(06/07/16):  AHI 15.0 events/hour., nadir satn 78%   >> Optimal CPAP pressure 14 cm      HRCT 04/2022 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 No appreciable interval progression since 06/29/2021 CT. Mild progression since baseline 05/31/2016      HRCT 06/2021 unchanged fibrosis  "alternative" HRCT 06/2020 UIP, unchanged compared to 2021, air trapping raises question of chronic hypersensitivity pneumonitis   HRCT 02/2019  c/w UIP , air trapping raises concern for HP, minimally worse compared to 2018   HRCT CHEST W/O 05/2016 UIP pattern.  Subcarinal and precarinal lymph nodes measuring up to 1.1 & 1.2 cm in short axis. 1.1 CM oblong nodule within left lower lobe      CT CHEST W/ 04/2016 : 9 mm left lower lobe nodule. Diffuse interlobular septal thickening with architectural distortion inferiorly with honeycombing versus bronchiectasis and medial right lower lobe.     CT CHEST W/ 07/2007:. Irregular interstitial prominence in addition. Ill-defined cystic lucencies diffusely. Fracture of left 10th rib noted.  Review of Systems neg for any significant sore throat, dysphagia, itching, sneezing, nasal congestion or excess/ purulent secretions, fever, chills, sweats, unintended wt loss, pleuritic or exertional cp, hempoptysis, orthopnea pnd or change in chronic leg swelling. Also denies presyncope, palpitations, heartburn, abdominal pain, nausea, vomiting, diarrhea or change in bowel or urinary habits, dysuria,hematuria, rash, arthralgias, visual complaints, headache, numbness weakness or ataxia.     Objective:   Physical Exam  Gen. Pleasant, obese, in no distress ENT - no lesions, no post nasal drip Neck: No JVD, no thyromegaly, no carotid bruits Lungs: no use of accessory muscles, no dullness to percussion, bibasal dry crackles  Cardiovascular: Rhythm regular, heart sounds  normal, no murmurs or gallops, no peripheral edema Musculoskeletal: No deformities, no cyanosis or clubbing , no tremors       Assessment & Plan:

## 2022-09-09 ENCOUNTER — Ambulatory Visit: Payer: Medicare Other | Admitting: Cardiology

## 2022-09-27 ENCOUNTER — Ambulatory Visit: Payer: Self-pay | Admitting: Internal Medicine

## 2022-09-30 NOTE — Progress Notes (Signed)
Interstitial Lung Disease Multidisciplinary Conference   Gary Keller    MRN [id number]    DOB 03/06/1951  Primary Care Physician:Anderson, Rosey Bath, FNP  Referring Physician: Dr. Cyril Mourning   Time of Conference: 7.00am- 8.00am Date of conference: 09/27/2022 Location of Conference: -  Virtual  Participating Pulmonary: Dr. Kalman Shan, Dr Chilton Greathouse, Dr. 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  Disease progression since 2018 now on 4 to 8 L oxygen  Good response to empiric trial of prednisone.  Did not tolerate Ofev and now on pirfenidone  -Review-serial HRCT.    PFT    Latest Ref Rng & Units 04/21/2022    1:02 PM 03/09/2021    1:38 PM 03/12/2020    9:30 AM 09/11/2019    8:41 AM 01/10/2019    9:21 AM 06/13/2017    1:52 PM 01/06/2017    8:33 AM  PFT Results  FVC-Pre L 1.69  1.97  2.04  2.08  2.22  2.41  2.29   FVC-Predicted Pre % 54  63  64  65  66  71  67   FVC-Post L 1.73    2.21  2.36     FVC-Predicted Post % 55    69  70     Pre FEV1/FVC % % 88  85  85  86  86  88  88   Post FEV1/FCV % % 88    85  84     FEV1-Pre L 1.48  1.67  1.75  1.79  1.90  2.13  2.03   FEV1-Predicted Pre % 66  73  76  77  77  85  81   FEV1-Post L 1.52    1.88  1.99     DLCO uncorrected ml/min/mmHg 9.37  12.82  15.69  13.44  13.36  17.43  17.35   DLCO UNC% % 47  64  78  67  64  76  75   DLCO corrected ml/min/mmHg 9.37  12.82  15.69    16.53  17.30   DLCO COR %Predicted % 47  64  78    72  75   DLVA Predicted % 85  110  107  97  97  108  119   TLC L 3.71    3.48  4.06     TLC % Predicted % 68    64  72     RV % Predicted % 71    46  83         MDD discussion of CT scan    - Date or time period of scan:  HRCT: 05/03/2022 HRCT: 06/29/2021 HRCT: 07/09/2020 HRCT: 03/08/2019 HRCT: 02/23/2018 HRCT: 05/31/2016  - Discussion synopsis:   2018 -> dec 2023: Now there is moderate to severe ILD. The fibrosis is subplerural and central that fits in with HP. there is moderate air trapping at the least.  No craniocaudal gradient. There is mild progression since 2018 CT.   - What is the final conclusion per 2018 ATS/Fleischner Criteria -  Features are now c/w ALTERNATIVE PATTERN of chronic HP  - Concordance with official report:  concordant report in dec 2023 . In 2018: they called it UIP with air trapping but since 2020, 2022 and 2023 read is HP. Now the air trapping is more prominent.  Overall concordant  reads  Pathology discussion of biopsy: n/a    MDD Impression/Recs: This is chronic HP. Slow progression. Managment recommendation a) with o2 need consider Right Heart Cath with intent to Rx with teprositinil. The Right Heart cath can be offered as standard of care or through [facility] research protocol (Hunsucker PI); b ) consider chronic standard of care maintenance with add on cellcept if progressing; c) if not pulmonary hypertension can also participate in trials of inhaled teprositinl   Time Spent in preparation and discussion:  > 30 min    SIGNATURE   Dr. Kalman Shan, M.D., F.C.C.P,  Pulmonary and Critical Care Medicine Staff Physician, [facility] Center Director - Interstitial Lung Disease  Program  Pulmonary Fibrosis [facility] at [facility] [place], [place], [address]  Pager: [phone], If no answer or between  15:00h - 7:00h: call [phone] Telephone: [phone]  12:41 PM 09/30/2022 ...................................................................................................................Marland Kitchen References: Diagnosis of Hypersensitivity Pneumonitis in Adults. An Official ATS/JRS/ALAT Clinical Practice Guideline. Ragu G et al, Am J Respir Crit Care Med. 2020 Aug 1;202(3):e36-e69.       Diagnosis of Idiopathic Pulmonary Fibrosis. An Official ATS/ERS/JRS/ALAT  Clinical Practice Guideline. Raghu G et al, Am J Respir Crit Care Med. 2018 Sep 1;198(5):e44-e68.   IPF Suspected   Histopath ology Pattern      UIP  Probable UIP  Indeterminate for  UIP  Alternative  diagnosis    UIP  IPF  IPF  IPF  Non-IPF dx   HRCT   Probabe UIP  IPF  IPF  IPF (Likely)**  Non-IPF dx  Pattern  Indeterminate for UIP  IPF  IPF (Likely)**  Indeterminate  for IPF**  Non-IPF dx    Alternative diagnosis  IPF (Likely)**/ non-IPF dx  Non-IPF dx  Non-IPF dx  Non-IPF dx     Idiopathic pulmonary fibrosis diagnosis based upon HRCT and Biopsy paterns.  ** IPF is the likely diagnosis when any of following features are present:  Moderate-to-severe traction bronchiectasis/bronchiolectasis (defined as mild traction bronchiectasis/bronchiolectasis in four or more lobes including the lingual as a lobe, or moderate to severe traction bronchiectasis in two or more lobes) in a man over age 12 years or in a woman over age 66 years Extensive (>30%) reticulation on HRCT and an age >70 years  Increased neutrophils and/or absence of lymphocytosis in BAL fluid  Multidisciplinary discussion reaches a confident diagnosis of IPF.   **Indeterminate for IPF  Without an adequate biopsy is unlikely to be IPF  With an adequate biopsy may be reclassified to a more specific diagnosis after multidisciplinary discussion and/or additional consultation.   dx = diagnosis; HRCT = high-resolution computed tomography; IPF = idiopathic pulmonary fibrosis; UIP = usual interstitial pneumonia.

## 2022-10-03 ENCOUNTER — Encounter (HOSPITAL_BASED_OUTPATIENT_CLINIC_OR_DEPARTMENT_OTHER): Payer: Self-pay | Admitting: Pulmonary Disease

## 2022-10-03 DIAGNOSIS — J849 Interstitial pulmonary disease, unspecified: Secondary | ICD-10-CM

## 2022-10-12 MED ORDER — PIRFENIDONE 267 MG PO CAPS: 801.0000 mg | ORAL_CAPSULE | Freq: Three times a day (TID) | ORAL | 1 refills | Status: DC

## 2022-10-17 ENCOUNTER — Ambulatory Visit (HOSPITAL_BASED_OUTPATIENT_CLINIC_OR_DEPARTMENT_OTHER): Payer: Medicare Other | Admitting: Pulmonary Disease

## 2022-10-20 NOTE — Telephone Encounter (Signed)
Fredric Mare, I was going to scheduled this but it looks like that day is not open yet.

## 2022-10-28 ENCOUNTER — Other Ambulatory Visit: Payer: Self-pay | Admitting: Cardiology

## 2022-10-28 ENCOUNTER — Other Ambulatory Visit: Payer: Self-pay | Admitting: Pulmonary Disease

## 2022-10-28 DIAGNOSIS — I2510 Atherosclerotic heart disease of native coronary artery without angina pectoris: Secondary | ICD-10-CM

## 2022-10-28 DIAGNOSIS — E785 Hyperlipidemia, unspecified: Secondary | ICD-10-CM

## 2022-11-07 ENCOUNTER — Encounter (HOSPITAL_BASED_OUTPATIENT_CLINIC_OR_DEPARTMENT_OTHER): Payer: Self-pay | Admitting: Pulmonary Disease

## 2022-11-07 ENCOUNTER — Other Ambulatory Visit: Payer: Self-pay | Admitting: Pulmonary Disease

## 2022-11-07 ENCOUNTER — Ambulatory Visit (HOSPITAL_BASED_OUTPATIENT_CLINIC_OR_DEPARTMENT_OTHER): Payer: Medicare Other | Admitting: Pulmonary Disease

## 2022-11-07 VITALS — BP 120/72 | HR 99 | Temp 98.1°F | Ht 62.0 in | Wt 223.2 lb

## 2022-11-07 DIAGNOSIS — G4733 Obstructive sleep apnea (adult) (pediatric): Secondary | ICD-10-CM

## 2022-11-07 DIAGNOSIS — J9611 Chronic respiratory failure with hypoxia: Secondary | ICD-10-CM

## 2022-11-07 DIAGNOSIS — R059 Cough, unspecified: Secondary | ICD-10-CM

## 2022-11-07 DIAGNOSIS — Z5181 Encounter for therapeutic drug level monitoring: Secondary | ICD-10-CM

## 2022-11-07 DIAGNOSIS — J849 Interstitial pulmonary disease, unspecified: Secondary | ICD-10-CM | POA: Diagnosis not present

## 2022-11-07 MED ORDER — PREDNISONE 10 MG PO TABS: 10.0000 mg | ORAL_TABLET | Freq: Every day | ORAL | 2 refills | Status: DC

## 2022-11-07 NOTE — Assessment & Plan Note (Signed)
CPAP download was reviewed which shows excellent control of events on 14 cm with minimal leak.  His compliance is excellent more than 9 hours every night.  CPAP is definitely helped improve his daytime somnolence and fatigue We will send in prescription for replacement CPAP at 14 cm Weight loss encouraged, compliance with goal of at least 4-6 hrs every night is the expectation. Advised against medications with sedative side effects Cautioned against driving when sleepy - understanding that sleepiness will vary on a day to day basis

## 2022-11-07 NOTE — Progress Notes (Signed)
Subjective:    Patient ID: Gary Keller, male    DOB: 06-Aug-1950, 72 y.o.   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    12/2018 started on OFEV due to drop in lung function -stopped 09/2021 due to diarrhea   06/2020 started on oxygen PFTs show a slight drop compared to 2018 and CT is worse when compared to 2008 indicating gradual progression   05/2022 >> started on pirfenidone HRCT 04/2022 suggested chronic HP.  Due to clinical progression and worsening hypoxia , OV 07/2022 >>empiric trial of prednisone 20 mg for 1 week followed by 10 mg daily.  He improved remarkably on prednisone and on our last office visit, we decided to continue. He remained stable on 5 L of oxygen.  He arrives with his daughter today.  He is tolerating prednisone very well, weight is stable, sugars have remained in the 180 range.  He is on metformin, glimepiride and Jardiance. He denies pedal edema  He is tolerating pirfenidone well, no nausea or photosensitivity. He reports error message on CPAP stating that it is at end-of-life    Significant tests/ events reviewed   Serology neg 06/2016 PFT   04/2022 >> FVC 54%, DLCO 9.4/47% 02/2021 >>  FVC 63%, DLCO 12.8/64%   02/2020 >> stable, FVC 64% DLCO 78%   12/2018 >> FVC 66%, TLC 72%, DLCO 64% 02/2018 FVC 66%   05/2017 ratio 88, FVC 71%, DLCO 76%   01/06/17: FVC 2.29 L (67%) FEV1 2.03 L (81%) FEV1/FVC 0.88   DLCO corrected 75%   06/24/16: FVC 2.49 L (76%) FEV1 2.21 L (91%) FEV1/FVC 0.89  negative bronchodilator response TLC 3.77 L (69%)  DLCO uncorrected 66%   01/06/17:  Walked 412 meters / Baseline Sat 99% on RA / Nadir Sat 90% on RA @ end of test     PSG(06/07/16):  AHI 15.0 events/hour., nadir satn 78%   >> Optimal CPAP pressure 14 cm      HRCT 04/2022 Findings are most compatible with chronic  hypersensitivity pneumonitis. No appreciable interval progression since 06/29/2021 CT. Mild progression since baseline 05/31/2016      HRCT 06/2021 unchanged fibrosis "alternative" HRCT 06/2020 UIP, unchanged compared to 2021, air trapping raises question of chronic hypersensitivity pneumonitis   HRCT 02/2019  c/w UIP , air trapping raises concern for HP, minimally worse compared to 2018   HRCT CHEST W/O 05/2016 UIP pattern.  Subcarinal and precarinal lymph nodes measuring up to 1.1 & 1.2 cm in short axis. 1.1 CM oblong nodule within left lower lobe      CT CHEST W/ 04/2016 : 9 mm left lower lobe nodule. Diffuse interlobular septal thickening with architectural distortion inferiorly with honeycombing versus bronchiectasis and medial right lower lobe.     CT CHEST W/ 07/2007:. Irregular interstitial prominence in addition. Ill-defined cystic lucencies diffusely. Fracture of left 10th rib noted.   Review of Systems neg for any significant sore throat, dysphagia, itching, sneezing, nasal congestion or excess/ purulent secretions, fever, chills, sweats, unintended wt loss, pleuritic or exertional cp, hempoptysis, orthopnea pnd or change in chronic leg swelling. Also denies presyncope, palpitations, heartburn, abdominal pain, nausea, vomiting, diarrhea or change in bowel or urinary habits, dysuria,hematuria, rash, arthralgias, visual complaints, headache, numbness weakness or ataxia.     Objective:   Physical Exam  Gen. Pleasant,  obese, in no distress ENT - no lesions, no post nasal drip Neck: No JVD, no thyromegaly, no carotid bruits Lungs: no use of accessory muscles, no dullness to percussion, bibasal dry crackles Cardiovascular: Rhythm regular, heart sounds  normal, no murmurs or gallops, no peripheral edema Musculoskeletal: No deformities, no cyanosis or clubbing , no tremors       Assessment & Plan:

## 2022-11-07 NOTE — Patient Instructions (Signed)
X CMET today  X Refill on prednisone - watch sugars  X Rx for  Replacement CPAP 14 cm

## 2022-11-07 NOTE — Assessment & Plan Note (Signed)
Check LFTs today. 

## 2022-11-07 NOTE — Assessment & Plan Note (Signed)
Continue oxygen at 5 L

## 2022-11-07 NOTE — Assessment & Plan Note (Signed)
Progressive phenotype Previously felt to be IPF with disease progression but some response to prednisone and air trapping on most recent CT suggesting hypersensitivity pneumonitis.  We had a risk-benefit discussion of long-term steroids -Options include continuing steroids at current dose -Attempting to decrease slightly -Use immunosuppressant as steroid sparing agent. He preferred to continue 10 mg of prednisone since he is tolerating well.  We discussed options should his sugars go higher We will reassess in 3 months and plan for high-resolution CT chest at the end of the year

## 2022-11-08 LAB — COMPREHENSIVE METABOLIC PANEL
ALT: 27 IU/L (ref 0–32)
AST: 17 IU/L (ref 0–40)
Albumin: 4.4 g/dL (ref 3.8–4.8)
Alkaline Phosphatase: 90 IU/L (ref 44–121)
BUN/Creatinine Ratio: 18 (ref 12–28)
BUN: 27 mg/dL (ref 8–27)
Bilirubin Total: 0.4 mg/dL (ref 0.0–1.2)
CO2: 25 mmol/L (ref 20–29)
Calcium: 9.2 mg/dL (ref 8.7–10.3)
Chloride: 99 mmol/L (ref 96–106)
Creatinine, Ser: 1.5 mg/dL — ABNORMAL HIGH (ref 0.57–1.00)
Globulin, Total: 2.7 g/dL (ref 1.5–4.5)
Glucose: 249 mg/dL — ABNORMAL HIGH (ref 70–99)
Potassium: 4.7 mmol/L (ref 3.5–5.2)
Sodium: 140 mmol/L (ref 134–144)
Total Protein: 7.1 g/dL (ref 6.0–8.5)
eGFR: 37 mL/min/{1.73_m2} — ABNORMAL LOW (ref >59)

## 2022-11-24 ENCOUNTER — Other Ambulatory Visit: Payer: Self-pay

## 2022-11-24 ENCOUNTER — Encounter (HOSPITAL_COMMUNITY): Payer: Self-pay | Admitting: Emergency Medicine

## 2022-11-24 ENCOUNTER — Emergency Department (HOSPITAL_COMMUNITY)
Admission: EM | Admit: 2022-11-24 | Discharge: 2022-11-24 | Disposition: A | Discharge status: Home / Self Care | Payer: Medicare Other | Attending: Emergency Medicine | Admitting: Emergency Medicine

## 2022-11-24 DIAGNOSIS — Z23 Encounter for immunization: Secondary | ICD-10-CM | POA: Insufficient documentation

## 2022-11-24 DIAGNOSIS — T310 Burns involving less than 10% of body surface: Secondary | ICD-10-CM | POA: Diagnosis not present

## 2022-11-24 DIAGNOSIS — T2024XA Burn of second degree of nose (septum), initial encounter: Secondary | ICD-10-CM | POA: Diagnosis not present

## 2022-11-24 DIAGNOSIS — T2020XA Burn of second degree of head, face, and neck, unspecified site, initial encounter: Secondary | ICD-10-CM

## 2022-11-24 DIAGNOSIS — Z79899 Other long term (current) drug therapy: Secondary | ICD-10-CM | POA: Diagnosis not present

## 2022-11-24 DIAGNOSIS — Z7902 Long term (current) use of antithrombotics/antiplatelets: Secondary | ICD-10-CM | POA: Diagnosis not present

## 2022-11-24 DIAGNOSIS — Z7984 Long term (current) use of oral hypoglycemic drugs: Secondary | ICD-10-CM | POA: Insufficient documentation

## 2022-11-24 DIAGNOSIS — E1122 Type 2 diabetes mellitus with diabetic chronic kidney disease: Secondary | ICD-10-CM | POA: Diagnosis not present

## 2022-11-24 DIAGNOSIS — I129 Hypertensive chronic kidney disease with stage 1 through stage 4 chronic kidney disease, or unspecified chronic kidney disease: Secondary | ICD-10-CM | POA: Insufficient documentation

## 2022-11-24 DIAGNOSIS — X088XXA Exposure to other specified smoke, fire and flames, initial encounter: Secondary | ICD-10-CM | POA: Diagnosis not present

## 2022-11-24 DIAGNOSIS — N189 Chronic kidney disease, unspecified: Secondary | ICD-10-CM | POA: Diagnosis not present

## 2022-11-24 DIAGNOSIS — T2000XA Burn of unspecified degree of head, face, and neck, unspecified site, initial encounter: Secondary | ICD-10-CM | POA: Diagnosis present

## 2022-11-24 MED ORDER — TETANUS-DIPHTH-ACELL PERTUSSIS 5-2.5-18.5 LF-MCG/0.5 IM SUSY
0.5000 mL | PREFILLED_SYRINGE | Freq: Once | INTRAMUSCULAR | Status: AC
  Administered 2022-11-24: 0.5 mL via INTRAMUSCULAR
  Filled 2022-11-24: qty 0.5

## 2022-11-24 MED ORDER — BACITRACIN ZINC 500 UNIT/GM EX OINT: 1.0000 | TOPICAL_OINTMENT | Freq: Two times a day (BID) | CUTANEOUS | 0 refills | Status: DC

## 2022-11-24 MED ORDER — BACITRACIN ZINC 500 UNIT/GM EX OINT
TOPICAL_OINTMENT | Freq: Two times a day (BID) | CUTANEOUS | Status: DC
  Administered 2022-11-24: 1 via TOPICAL
  Filled 2022-11-24: qty 0.9
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
## 2022-11-24 NOTE — ED Notes (Signed)
Pt nose cleaned with soap and water
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
## 2023-01-18 ENCOUNTER — Telehealth: Payer: Self-pay

## 2023-01-18 NOTE — Telephone Encounter (Signed)
Received fax from [place] stating that pt is APPROVED to continue receiving financial assistance through 2025. Approval letter sent to scan center for retention. Nothing further needed at this time.

## 2023-01-31 ENCOUNTER — Ambulatory Visit (HOSPITAL_BASED_OUTPATIENT_CLINIC_OR_DEPARTMENT_OTHER): Payer: Medicare Other | Admitting: Pulmonary Disease

## 2023-01-31 ENCOUNTER — Encounter (HOSPITAL_BASED_OUTPATIENT_CLINIC_OR_DEPARTMENT_OTHER): Payer: Self-pay | Admitting: Pulmonary Disease

## 2023-01-31 VITALS — BP 132/64 | HR 87 | Ht 62.0 in | Wt 203.0 lb

## 2023-01-31 DIAGNOSIS — Z23 Encounter for immunization: Secondary | ICD-10-CM | POA: Diagnosis not present

## 2023-01-31 DIAGNOSIS — G4733 Obstructive sleep apnea (adult) (pediatric): Secondary | ICD-10-CM

## 2023-01-31 DIAGNOSIS — J849 Interstitial pulmonary disease, unspecified: Secondary | ICD-10-CM

## 2023-01-31 NOTE — Assessment & Plan Note (Addendum)
He had clinical improvement with prednisone and so we have maintained him on 10 mg. Surprisingly he has lost 20 pounds and this has really helped his breathing, his sugars appear.  Better control.  Will attempt to drop prednisone slightly, alternating 10 mg with 5 mg for the next 3 months.  We will reassess with high-resolution CT chest in 3 months. Meanwhile he is also tolerating pirfenidone quite well and we will obtain LFTs for drug monitoring

## 2023-01-31 NOTE — Progress Notes (Signed)
Subjective:    Patient ID: Gary Keller, male    DOB: Nov 13, 1950, 72 y.o.   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    12/2018 started on OFEV due to drop in lung function -stopped 09/2021 due to diarrhea   06/2020 started on oxygen PFTs show a slight drop compared to 2018 and CT is worse when compared to 2008 indicating gradual progression   05/2022 >> started on pirfenidone HRCT 04/2022 suggested chronic HP.  Due to clinical progression and worsening hypoxia , OV 07/2022 >>empiric trial of prednisone 20 mg for 1 week followed by 10 mg daily.    27-month follow-up visit. He continues on 10 mg of prednisone which she seems to be tolerating well. He has lost weight from 223 to 203 pounds.  He has come off one of his diabetes pills.  Sugars are doing better.  His breathing is stable.  He remains on 5 L nasal cannula. He is tolerating pirfenidone. Last office visit we got him a new CPAP machine and this seems to be working well.  Denies any problems with mask or pressure  Significant tests/ events reviewed   Serology neg 06/2016 PFT   04/2022 >> FVC 54%, DLCO 9.4/47% 02/2021 >>  FVC 63%, DLCO 12.8/64%   02/2020 >> stable, FVC 64% DLCO 78%   12/2018 >> FVC 66%, TLC 72%, DLCO 64% 02/2018 FVC 66%   05/2017 ratio 88, FVC 71%, DLCO 76%   01/06/17: FVC 2.29 L (67%) FEV1 2.03 L (81%) FEV1/FVC 0.88   DLCO corrected 75%   06/24/16: FVC 2.49 L (76%) FEV1 2.21 L (91%) FEV1/FVC 0.89  negative bronchodilator response TLC 3.77 L (69%)  DLCO uncorrected 66%   01/06/17:  Walked 412 meters / Baseline Sat 99% on RA / Nadir Sat 90% on RA @ end of test     PSG(06/07/16):  AHI 15.0 events/hour., nadir satn 78%   >> Optimal CPAP pressure 14 cm      HRCT 04/2022 Findings are most  compatible with chronic hypersensitivity pneumonitis. No appreciable interval progression since 06/29/2021 CT. Mild progression since baseline 05/31/2016      HRCT 06/2021 unchanged fibrosis "alternative" HRCT 06/2020 UIP, unchanged compared to 2021, air trapping raises question of chronic hypersensitivity pneumonitis   HRCT 02/2019  c/w UIP , air trapping raises concern for HP, minimally worse compared to 2018   HRCT CHEST W/O 05/2016 UIP pattern.  Subcarinal and precarinal lymph nodes measuring up to 1.1 & 1.2 cm in short axis. 1.1 CM oblong nodule within left lower lobe      CT CHEST W/ 04/2016 : 9 mm left lower lobe nodule. Diffuse interlobular septal thickening with architectural distortion inferiorly with honeycombing versus bronchiectasis and medial right lower lobe.     CT CHEST W/ 07/2007:. Irregular interstitial prominence in addition. Ill-defined cystic lucencies diffusely. Fracture of left 10th rib noted.    Review of Systems neg for any significant sore throat, dysphagia, itching, sneezing, nasal congestion or excess/ purulent secretions, fever, chills, sweats, unintended wt loss, pleuritic or exertional cp, hempoptysis, orthopnea pnd or change in chronic leg swelling. Also denies presyncope, palpitations, heartburn, abdominal pain, nausea, vomiting, diarrhea or change in bowel or urinary habits, dysuria,hematuria, rash, arthralgias, visual complaints, headache, numbness weakness or ataxia.  Objective:   Physical Exam  Gen. Pleasant, obese, in no distress ENT - no lesions, no post nasal drip Neck: No JVD, no thyromegaly, no carotid bruits Lungs: no use of accessory muscles, no dullness to percussion, decreased without rales or rhonchi  Cardiovascular: Rhythm regular, heart sounds  normal, no murmurs or gallops, no peripheral edema Musculoskeletal: No deformities, no cyanosis or clubbing , no tremors       Assessment & Plan:

## 2023-01-31 NOTE — Assessment & Plan Note (Signed)
CPAP download was reviewed which shows excellent control of events on 14 cm and good compliance with minimal leak.  CPAP is only helped his daytime somnolence and fatigue Weight loss encouraged, compliance with goal of at least 4-6 hrs every night is the expectation. Advised against medications with sedative side effects Cautioned against driving when sleepy - understanding that sleepiness will vary on a day to day basis

## 2023-01-31 NOTE — Patient Instructions (Addendum)
x flu shot today, RSV recommended.  x check LFT today  x high-resolution CT chest in December  xRx for prednisone 5 mg tablets #60 x 1 refill Take 5 mg prednisone on Monday/Wednesday/Friday Take 10 mg all other days

## 2023-02-03 ENCOUNTER — Other Ambulatory Visit: Payer: Self-pay | Admitting: Cardiology

## 2023-02-03 ENCOUNTER — Encounter (HOSPITAL_BASED_OUTPATIENT_CLINIC_OR_DEPARTMENT_OTHER): Payer: Self-pay | Admitting: Pulmonary Disease

## 2023-02-03 DIAGNOSIS — E785 Hyperlipidemia, unspecified: Secondary | ICD-10-CM

## 2023-02-03 DIAGNOSIS — I2510 Atherosclerotic heart disease of native coronary artery without angina pectoris: Secondary | ICD-10-CM

## 2023-02-06 MED ORDER — PREDNISONE 5 MG PO TABS: ORAL_TABLET | ORAL | 1 refills | Status: DC

## 2023-04-18 ENCOUNTER — Encounter (HOSPITAL_BASED_OUTPATIENT_CLINIC_OR_DEPARTMENT_OTHER): Payer: Self-pay

## 2023-04-18 ENCOUNTER — Ambulatory Visit (HOSPITAL_BASED_OUTPATIENT_CLINIC_OR_DEPARTMENT_OTHER)
Admission: RE | Admit: 2023-04-18 | Discharge: 2023-04-18 | Disposition: A | Discharge status: Home / Self Care | Payer: Medicare Other | Source: Ambulatory Visit | Attending: Pulmonary Disease | Admitting: Pulmonary Disease

## 2023-04-18 DIAGNOSIS — J849 Interstitial pulmonary disease, unspecified: Secondary | ICD-10-CM | POA: Insufficient documentation

## 2023-04-24 ENCOUNTER — Other Ambulatory Visit: Payer: Self-pay | Admitting: Pharmacist

## 2023-04-24 ENCOUNTER — Encounter (HOSPITAL_BASED_OUTPATIENT_CLINIC_OR_DEPARTMENT_OTHER): Payer: Self-pay | Admitting: Pulmonary Disease

## 2023-04-24 DIAGNOSIS — J849 Interstitial pulmonary disease, unspecified: Secondary | ICD-10-CM

## 2023-04-24 MED ORDER — PIRFENIDONE 267 MG PO CAPS
801.0000 mg | ORAL_CAPSULE | Freq: Three times a day (TID) | ORAL | 1 refills | Status: DC
Start: 2023-04-24 — End: 2023-08-28

## 2023-04-27 ENCOUNTER — Encounter (HOSPITAL_BASED_OUTPATIENT_CLINIC_OR_DEPARTMENT_OTHER): Payer: Self-pay | Admitting: Pulmonary Disease

## 2023-05-01 ENCOUNTER — Other Ambulatory Visit: Payer: Self-pay | Admitting: Pulmonary Disease

## 2023-05-01 ENCOUNTER — Encounter (HOSPITAL_BASED_OUTPATIENT_CLINIC_OR_DEPARTMENT_OTHER): Payer: Self-pay | Admitting: Pulmonary Disease

## 2023-05-01 ENCOUNTER — Ambulatory Visit (HOSPITAL_BASED_OUTPATIENT_CLINIC_OR_DEPARTMENT_OTHER): Payer: Medicare Other | Admitting: Pulmonary Disease

## 2023-05-01 VITALS — BP 122/68 | HR 90 | Ht 62.0 in | Wt 184.0 lb

## 2023-05-01 DIAGNOSIS — Z7952 Long term (current) use of systemic steroids: Secondary | ICD-10-CM

## 2023-05-01 DIAGNOSIS — Z5181 Encounter for therapeutic drug level monitoring: Secondary | ICD-10-CM | POA: Diagnosis not present

## 2023-05-01 DIAGNOSIS — J8410 Pulmonary fibrosis, unspecified: Secondary | ICD-10-CM

## 2023-05-01 NOTE — Progress Notes (Signed)
Subjective:    Patient ID: Gary Keller, male    DOB: 06/27/1950, 72 y.o.   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    12/2018 started on OFEV due to drop in lung function -stopped 09/2021 due to diarrhea   06/2020 started on oxygen PFTs show a slight drop compared to 2018 and CT is worse when compared to 2008 indicating gradual progression   05/2022 >> started on pirfenidone HRCT 04/2022 suggested chronic HP.  Due to clinical progression and worsening hypoxia , OV 07/2022 >>empiric trial of prednisone 20 mg for 1 week followed by 10 mg daily.    72-month follow-up visit. He has gradually tapered down on prednisone to 5 mg for last few days  Discussed the use of AI scribe software for clinical note transcription with the patient, who gave verbal consent to proceed.  History of Present Illness   The patient, with a history of diabetes and pulm fibrosis, presents for a follow-up visit. He reports significant weight loss, which he attributes to the use of Ozempic and a decrease in appetite. The patient's diabetes is currently well-managed, and he is no longer on any diabetes medication. His HbA1c is reported to be 5.6.  Regarding his lung condition, the patient has been on prednisone, with a recent decrease in dosage. He reports no significant change in his breathing or coughing symptoms with the decrease in prednisone. The patient's recent scan showed mild worsening of his lung condition compared to previous scans, but the patient reports feeling better than before starting prednisone.  The patient also mentions a concern about potential side effects of long-term prednisone use, including bruising, worsening of blood sugar levels, and potential eye problems. However, he expresses  willingness to continue with the medication at a lower dose.        Significant tests/ events reviewed   Serology neg 06/2016 PFT   04/2022 >> FVC 54%, DLCO 9.4/47% 02/2021 >>  FVC 63%, DLCO 12.8/64%   02/2020 >> stable, FVC 64% DLCO 78%   12/2018 >> FVC 66%, TLC 72%, DLCO 64% 02/2018 FVC 66%   05/2017 ratio 88, FVC 71%, DLCO 76%   01/06/17: FVC 2.29 L (67%) FEV1 2.03 L (81%) FEV1/FVC 0.88   DLCO corrected 75%   06/24/16: FVC 2.49 L (76%) FEV1 2.21 L (91%) FEV1/FVC 0.89  negative bronchodilator response TLC 3.77 L (69%)  DLCO uncorrected 66%   01/06/17:  Walked 412 meters / Baseline Sat 99% on RA / Nadir Sat 90% on RA @ end of test     PSG(06/07/16):  AHI 15.0 events/hour., nadir satn 78%   >> Optimal CPAP pressure 14 cm     HRCT 04/2023 slightly progressive from 05/03/2022>> fibrotic HP   HRCT 04/2022 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 No appreciable interval progression since 06/29/2021 CT. Mild progression since baseline 05/31/2016      HRCT 06/2021 unchanged fibrosis "alternative" HRCT 06/2020 UIP, unchanged compared to 2021, air trapping raises question of chronic hypersensitivity pneumonitis   HRCT 02/2019  c/w UIP , air trapping raises concern for HP, minimally worse compared to 2018   HRCT CHEST W/O 05/2016 UIP pattern.  Subcarinal and precarinal lymph nodes measuring up to 1.1 & 1.2 cm in short axis. 1.1 CM oblong nodule  within left lower lobe      CT CHEST W/ 04/2016 : 9 mm left lower lobe nodule. Diffuse interlobular septal thickening with architectural distortion inferiorly with honeycombing versus bronchiectasis and medial right lower lobe.     CT CHEST W/ 07/2007:. Irregular interstitial prominence in addition. Ill-defined cystic lucencies diffusely. Fracture of left 10th rib noted.  Review of Systems neg for any significant sore throat, dysphagia, itching, sneezing, nasal congestion or excess/ purulent secretions,  fever, chills, sweats, unintended wt loss, pleuritic or exertional cp, hempoptysis, orthopnea pnd or change in chronic leg swelling. Also denies presyncope, palpitations, heartburn, abdominal pain, nausea, vomiting, diarrhea or change in bowel or urinary habits, dysuria,hematuria, rash, arthralgias, visual complaints, headache, numbness weakness or ataxia.     Objective:   Physical Exam  Gen. Pleasant, obese, in no distress ENT - no lesions, no post nasal drip Neck: No JVD, no thyromegaly, no carotid bruits Lungs: no use of accessory muscles, no dullness to percussion, bibasal rales no rhonchi  Cardiovascular: Rhythm regular, heart sounds  normal, no murmurs or gallops, no peripheral edema Musculoskeletal: No deformities, no cyanosis or clubbing , no tremors       Assessment & Plan:    Assessment and Plan    Pulmonary Fibrosis Favor fibrotic HP with scarring both upper &  lower lobes and some honeycombing. Condition is well-managed with current treatment of prednisone and pirfenidone. Discussed reducing prednisone to 5 mg daily due to minimal side effects at this dose compared to higher doses or alternative immunosuppressants like Cellcept and Imuran, which have significant side effects such as diarrhea, GI tract issues, and bone marrow suppression. Patient prefers to try reducing prednisone to 5 mg daily and monitor for any changes. - Reduce prednisone to 5 mg daily - Continue pirfenidone - Order liver function tests - Refill prednisone as needed - Follow-up in 3-4 months  Type 2 Diabetes Mellitus Type 2 diabetes mellitus well-controlled with HbA1c at 5.6. Patient has discontinued all diabetes medications following significant weight loss attributed to Ozempic and dietary changes. - Continue monitoring HbA1c  General Health Maintenance  - Continue current health maintenance regimen  Follow-up - Schedule follow-up appointment in 3-4 months.

## 2023-05-01 NOTE — Patient Instructions (Signed)
X LFTs today  Refills  on prednisone as needed

## 2023-05-02 LAB — HEPATIC FUNCTION PANEL
ALT: 28 [IU]/L (ref 0–32)
AST: 23 [IU]/L (ref 0–40)
Albumin: 4.7 g/dL (ref 3.8–4.8)
Alkaline Phosphatase: 83 [IU]/L (ref 44–121)
Bilirubin Total: 0.5 mg/dL (ref 0.0–1.2)
Bilirubin, Direct: 0.21 mg/dL (ref 0.00–0.40)
Total Protein: 7.4 g/dL (ref 6.0–8.5)

## 2023-05-08 NOTE — Progress Notes (Signed)
Patient has viewed results and provider notes on mychart.

## 2023-05-19 NOTE — Telephone Encounter (Signed)
 No orders have been placed since replacement cpap ordered on 11/07/22

## 2023-07-06 ENCOUNTER — Other Ambulatory Visit: Payer: Self-pay | Admitting: Family

## 2023-07-06 DIAGNOSIS — I2510 Atherosclerotic heart disease of native coronary artery without angina pectoris: Secondary | ICD-10-CM

## 2023-07-06 DIAGNOSIS — E785 Hyperlipidemia, unspecified: Secondary | ICD-10-CM

## 2023-07-25 ENCOUNTER — Encounter (HOSPITAL_BASED_OUTPATIENT_CLINIC_OR_DEPARTMENT_OTHER): Payer: Self-pay | Admitting: Pulmonary Disease

## 2023-07-25 ENCOUNTER — Ambulatory Visit (HOSPITAL_BASED_OUTPATIENT_CLINIC_OR_DEPARTMENT_OTHER): Payer: Medicare Other | Admitting: Pulmonary Disease

## 2023-07-25 VITALS — BP 122/84 | HR 82 | Ht 62.0 in | Wt 178.1 lb

## 2023-07-25 DIAGNOSIS — Z87891 Personal history of nicotine dependence: Secondary | ICD-10-CM

## 2023-07-25 DIAGNOSIS — J9611 Chronic respiratory failure with hypoxia: Secondary | ICD-10-CM

## 2023-07-25 DIAGNOSIS — J678 Hypersensitivity pneumonitis due to other organic dusts: Secondary | ICD-10-CM | POA: Diagnosis not present

## 2023-07-25 DIAGNOSIS — J849 Interstitial pulmonary disease, unspecified: Secondary | ICD-10-CM

## 2023-07-25 DIAGNOSIS — G4733 Obstructive sleep apnea (adult) (pediatric): Secondary | ICD-10-CM | POA: Diagnosis not present

## 2023-07-25 DIAGNOSIS — Z9981 Dependence on supplemental oxygen: Secondary | ICD-10-CM

## 2023-07-25 NOTE — Progress Notes (Signed)
 Subjective:    Patient ID: Gary Keller, male    DOB: 1950/06/24, 73 y.o.   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  -dates back to 2009   12/2018 started on OFEV due to drop in lung function -stopped 09/2021 due to diarrhea   06/2020 started on oxygen PFTs show a slight drop compared to 2018 and CT is worse when compared to 2008 indicating gradual progression   05/2022 >> started on pirfenidone HRCT 04/2022 suggested chronic HP.  Due to clinical progression and worsening hypoxia , OV 07/2022 >>empiric trial of prednisone 20 mg for 1 week followed by 10 mg daily.     The patient, with a history of ILD, favored to be fibrotic HP, and sleep apnea, presents for a three-month follow-up. He has been maintained on CPAP at fourteen centimeters and prednisone, which was reduced to five mg three months ago. The patient reports significant weight loss of approximately fifty-five pounds, which has led to improved breathing and mobility. He no longer needs to stop while walking due to breathlessness. The patient is also on oxygen therapy, currently at three liters, which he sometimes reduces. He does not currently have a Portable Oxygen Concentrator (POC) but is considering purchasing one. The patient has been on prednisone for almost a year and has managed the side effects well, including maintaining his weight and blood sugar levels. The patient also has sleep apnea, which is well managed with an auto CPAP machine, although he reports dry mouth as a side effect.  CPAP download shows excellent control of events on 14 cm, good compliance more than 9 hours per night without a single missed night.  CPAP is only helped improve his daytime somnolence and fatigue Significant tests/ events reviewed    Serology neg 06/2016 PFT   04/2022 >> FVC 54%, DLCO 9.4/47% 02/2021 >>  FVC 63%, DLCO 12.8/64%   02/2020 >> stable, FVC 64% DLCO 78%   12/2018 >> FVC 66%, TLC 72%, DLCO 64% 02/2018 FVC 66%   05/2017 ratio 88, FVC 71%, DLCO 76%   01/06/17: FVC 2.29 L (67%) FEV1 2.03 L (81%) FEV1/FVC 0.88   DLCO corrected 75%   06/24/16: FVC 2.49 L (76%) FEV1 2.21 L (91%) FEV1/FVC 0.89  negative bronchodilator response TLC 3.77 L (69%)  DLCO uncorrected 66%   01/06/17:  Walked 412 meters / Baseline Sat 99% on RA / Nadir Sat 90% on RA @ end of test     PSG(06/07/16):  AHI 15.0 events/hour., nadir satn 78%   >> Optimal CPAP pressure 14 cm      HRCT 04/2023 slightly progressive from 05/03/2022>> fibrotic HP   HRCT 04/2022 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 No appreciable interval progression since 06/29/2021 CT. Mild progression since baseline 05/31/2016      HRCT 06/2021 unchanged fibrosis "alternative" HRCT 06/2020 UIP, unchanged compared to 2021, air trapping raises question of chronic hypersensitivity pneumonitis   HRCT 02/2019  c/w UIP , air trapping raises concern for HP, minimally worse compared to 2018   HRCT CHEST W/O 05/2016 UIP pattern.  Subcarinal and precarinal lymph nodes measuring up to 1.1 & 1.2 cm in short axis. 1.1 CM oblong nodule within left lower lobe      CT CHEST W/ 04/2016 : 9  mm left lower lobe nodule. Diffuse interlobular septal thickening with architectural distortion inferiorly with honeycombing versus bronchiectasis and medial right lower lobe.     CT CHEST W/ 07/2007:. Irregular interstitial prominence in addition. Ill-defined cystic lucencies diffusely. Fracture of left 10th rib noted.  Review of Systems neg for any significant sore throat, dysphagia, itching, sneezing, nasal congestion or excess/ purulent secretions, fever, chills, sweats, unintended wt loss, pleuritic or exertional cp, hempoptysis, orthopnea pnd or change in  chronic leg swelling. Also denies presyncope, palpitations, heartburn, abdominal pain, nausea, vomiting, diarrhea or change in bowel or urinary habits, dysuria,hematuria, rash, arthralgias, visual complaints, headache, numbness weakness or ataxia.     Objective:   Physical Exam  Gen. 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 He is managing weight and blood sugar levels well, with a recent blood sugar of 96 and an A1c of 5. Long-term side effects of prednisone, including cataracts, glaucoma, and bone weakening, are lessened at the current dose. The plan is to further reduce prednisone to assess if he can maintain stability without it, based on the absence of disease flare-ups and the desire to minimize long-term side effects. - Reduce prednisone by taking two days off per week for the next three months - Monitor for worsening breathing or cough and revert to daily 5 mg prednisone if symptoms worsen - Check liver function tests (LFTs) for perfenidone  Chronic Hypoxic Respiratory Failure He is on 3 liters of oxygen and has experienced significant weight loss, improving his breathing and mobility. He lacks a portable oxygen concentrator (POC) due to previous insurance and equipment issues. The potential benefits of purchasing a POC outright were discussed, considering his improved condition and mobility needs. - Ambulate to assess oxygen saturation on POC >> was  able to maintain on 4L POC,  they will buy from [employer] out of pocket - Advise on purchasing a POC with both continuous and intermittent settings  Obstructive Sleep Apnea He uses CPAP at 14 cm pressure. With significant weight loss, the pressure might be too high, causing dry mouth. He has an auto CPAP machine, which can adjust the pressure automatically. Adjusting the CPAP settings is intended to improve comfort and adherence to therapy. - Change CPAP settings to auto mode with a range of 10 to 14 cm

## 2023-07-25 NOTE — Patient Instructions (Addendum)
 Decrease prednisone - refill 5 mg tabs #60 - Skip 2 days/week Monday/Friday  May - If tolerated well, skip 3 days/week -M/W/F  Change to autoCPAP 10-14 cm  Check LFTs today  Evaluate for POC

## 2023-07-26 ENCOUNTER — Encounter (HOSPITAL_BASED_OUTPATIENT_CLINIC_OR_DEPARTMENT_OTHER): Payer: Self-pay | Admitting: Pulmonary Disease

## 2023-07-26 LAB — HEPATIC FUNCTION PANEL
ALT: 19 IU/L (ref 0–44)
AST: 22 IU/L (ref 0–40)
Albumin: 4.5 g/dL (ref 3.8–4.8)
Alkaline Phosphatase: 104 IU/L (ref 44–121)
Bilirubin Total: 0.5 mg/dL (ref 0.0–1.2)
Bilirubin, Direct: 0.25 mg/dL (ref 0.00–0.40)
Total Protein: 6.9 g/dL (ref 6.0–8.5)

## 2023-07-28 ENCOUNTER — Telehealth: Payer: Self-pay | Admitting: Pulmonary Disease

## 2023-07-28 NOTE — Telephone Encounter (Signed)
 Gary Keller checking on fax sent for portable oxygen concentrator. Gary Keller phone number is [phone].

## 2023-07-28 NOTE — Telephone Encounter (Signed)
 Referral was placed on 3/11. Was this a fax sent by you for referral?

## 2023-07-28 NOTE — Telephone Encounter (Signed)
 Raven please check on this order.  Thanks!

## 2023-08-03 NOTE — Telephone Encounter (Signed)
 Gary Keller with [employer] calling again. They are needing the order information based on the fax they sent over for the Dr. To sign and date.   Fax [phone] Gary Keller [phone]  I will ask her to fax to [facility] fax if you need me to get it for you.

## 2023-08-03 NOTE — Telephone Encounter (Signed)
 I'll have this faxed over to [employer].

## 2023-08-04 NOTE — Telephone Encounter (Signed)
 NFN

## 2023-08-09 ENCOUNTER — Encounter: Payer: Self-pay | Admitting: Cardiology

## 2023-08-09 ENCOUNTER — Other Ambulatory Visit (HOSPITAL_COMMUNITY): Payer: Self-pay

## 2023-08-09 ENCOUNTER — Telehealth: Payer: Self-pay | Admitting: Pharmacy Technician

## 2023-08-09 DIAGNOSIS — I2510 Atherosclerotic heart disease of native coronary artery without angina pectoris: Secondary | ICD-10-CM

## 2023-08-09 DIAGNOSIS — E785 Hyperlipidemia, unspecified: Secondary | ICD-10-CM

## 2023-08-09 NOTE — Telephone Encounter (Signed)
 Ran test claim for VASCEPA. For a 30 day supply and the co-pay is 47.00 . PA is not needed at this time. Nothing saying this is a transition fill. This test claim was processed through [facility]- copay amounts may vary at other pharmacies due to pharmacy/plan contracts, or as the patient moves through the different stages of their insurance plan.      We received a note to do a prior authorization for icosapent ethyl 1 g capsule but his insurance said that Vascepa is the preferred drug, so I called [facility] and asked them to run. They were able to fill it for brand as well. I called the patient to make him aware

## 2023-08-15 ENCOUNTER — Telehealth (HOSPITAL_BASED_OUTPATIENT_CLINIC_OR_DEPARTMENT_OTHER): Payer: Self-pay | Admitting: Pulmonary Disease

## 2023-08-15 NOTE — Telephone Encounter (Signed)
 CMN received from [employer] forms have been signed and faxed. Nothing further needed

## 2023-08-16 MED ORDER — VASCEPA 1 G PO CAPS
2.0000 g | ORAL_CAPSULE | Freq: Two times a day (BID) | ORAL | 11 refills | Status: AC
Start: 2023-08-16 — End: ?

## 2023-08-16 NOTE — Telephone Encounter (Signed)
 Willette Cluster, CPhT to    08/16/23 12:44 PM Hi, so I called and they said he only has 60 capsules left remaining on that prescription that was sent in on 07/06/23 because it was written for 180 capsules with no refills. He got 120 capsules on 07/06/23, so left 60 capsules left. The pharmacist said the 60 capsules was ran on a discount card which gave an amount that would not be the amount on his insurance. Can someone send in a new prescription and then he said he would run it on his insurance and yes should be 47.00 for 30 days for brand vascepa on his insurance. Thank you!!    Noted  Rx sent to pharmacy

## 2023-08-25 ENCOUNTER — Encounter (HOSPITAL_BASED_OUTPATIENT_CLINIC_OR_DEPARTMENT_OTHER): Payer: Self-pay | Admitting: Pulmonary Disease

## 2023-08-25 NOTE — Telephone Encounter (Signed)
 Refill

## 2023-08-28 ENCOUNTER — Other Ambulatory Visit: Payer: Self-pay | Admitting: Pharmacist

## 2023-08-28 DIAGNOSIS — J849 Interstitial pulmonary disease, unspecified: Secondary | ICD-10-CM

## 2023-08-28 MED ORDER — PIRFENIDONE 267 MG PO CAPS
801.0000 mg | ORAL_CAPSULE | Freq: Three times a day (TID) | ORAL | 1 refills | Status: AC
Start: 2023-08-28 — End: ?

## 2023-08-28 NOTE — Telephone Encounter (Signed)
 Refill sent for ESBRIET to [facility] for Esbriet: [phone]  Dose: 810mg  three times daily  Last OV: 07/25/2023 Provider: Dr. Celene Coins Pertinent labs: LFTs on 07/25/2023 wnl  Next OV: 11/27/2023  Geraldene Kleine, PharmD, MPH, BCPS Clinical Pharmacist (Rheumatology and Pulmonology)

## 2023-09-01 ENCOUNTER — Encounter (HOSPITAL_BASED_OUTPATIENT_CLINIC_OR_DEPARTMENT_OTHER): Payer: Self-pay | Admitting: Pulmonary Disease

## 2023-09-01 DIAGNOSIS — J9611 Chronic respiratory failure with hypoxia: Secondary | ICD-10-CM

## 2023-09-01 DIAGNOSIS — J849 Interstitial pulmonary disease, unspecified: Secondary | ICD-10-CM

## 2023-09-04 ENCOUNTER — Telehealth: Payer: Self-pay

## 2023-09-04 NOTE — Telephone Encounter (Signed)
 Copied from CRM [id number]. Topic: Clinical - Prescription Issue >> Sep 04, 2023  1:13 PM Tyronne Galloway wrote: Reason for CRM: Larinda Plover from [facility] is requesting patient medical records regarding last visit notes or dates he is using oxygen faxed to [phone]. Christ stated the patient is buying a portable oxygen concentrator from [facility] today, but in order to complete purchase this is requested.

## 2023-09-04 NOTE — Telephone Encounter (Unsigned)
 Copied from CRM [id number]. Topic: Clinical - Prescription Issue >> Sep 04, 2023  1:13 PM Tyronne Galloway wrote: Reason for CRM: Larinda Plover from [facility] is requesting patient medical records regarding last visit notes or dates he is using oxygen faxed to [phone]. Christ stated the patient is buying a portable oxygen concentrator from [facility] today, but in order to complete purchase this is requested.

## 2023-09-05 NOTE — Telephone Encounter (Signed)
 Notes have been faxed based on previous patient message 09/01/23. Nothing further needed.

## 2023-09-10 ENCOUNTER — Other Ambulatory Visit: Payer: Self-pay

## 2023-09-10 ENCOUNTER — Encounter (HOSPITAL_BASED_OUTPATIENT_CLINIC_OR_DEPARTMENT_OTHER): Payer: Self-pay | Admitting: Emergency Medicine

## 2023-09-10 ENCOUNTER — Inpatient Hospital Stay (HOSPITAL_BASED_OUTPATIENT_CLINIC_OR_DEPARTMENT_OTHER)
Admission: EM | Admit: 2023-09-10 | Discharge: 2023-09-13 | DRG: 274 | Disposition: A | Discharge status: Home / Self Care | Attending: Internal Medicine | Admitting: Internal Medicine

## 2023-09-10 ENCOUNTER — Emergency Department (HOSPITAL_BASED_OUTPATIENT_CLINIC_OR_DEPARTMENT_OTHER)

## 2023-09-10 DIAGNOSIS — I129 Hypertensive chronic kidney disease with stage 1 through stage 4 chronic kidney disease, or unspecified chronic kidney disease: Secondary | ICD-10-CM | POA: Diagnosis present

## 2023-09-10 DIAGNOSIS — Z9861 Coronary angioplasty status: Secondary | ICD-10-CM

## 2023-09-10 DIAGNOSIS — J8410 Pulmonary fibrosis, unspecified: Secondary | ICD-10-CM | POA: Diagnosis present

## 2023-09-10 DIAGNOSIS — N1831 Chronic kidney disease, stage 3a: Secondary | ICD-10-CM | POA: Diagnosis present

## 2023-09-10 DIAGNOSIS — I444 Left anterior fascicular block: Secondary | ICD-10-CM | POA: Diagnosis present

## 2023-09-10 DIAGNOSIS — R000 Tachycardia, unspecified: Secondary | ICD-10-CM | POA: Diagnosis present

## 2023-09-10 DIAGNOSIS — Z9981 Dependence on supplemental oxygen: Secondary | ICD-10-CM

## 2023-09-10 DIAGNOSIS — Z823 Family history of stroke: Secondary | ICD-10-CM

## 2023-09-10 DIAGNOSIS — Z77098 Contact with and (suspected) exposure to other hazardous, chiefly nonmedicinal, chemicals: Secondary | ICD-10-CM | POA: Diagnosis present

## 2023-09-10 DIAGNOSIS — I2489 Other forms of acute ischemic heart disease: Secondary | ICD-10-CM | POA: Diagnosis present

## 2023-09-10 DIAGNOSIS — I483 Typical atrial flutter: Secondary | ICD-10-CM | POA: Diagnosis present

## 2023-09-10 DIAGNOSIS — I4891 Unspecified atrial fibrillation: Secondary | ICD-10-CM | POA: Diagnosis present

## 2023-09-10 DIAGNOSIS — I4892 Unspecified atrial flutter: Secondary | ICD-10-CM | POA: Diagnosis present

## 2023-09-10 DIAGNOSIS — Z7901 Long term (current) use of anticoagulants: Secondary | ICD-10-CM

## 2023-09-10 DIAGNOSIS — J849 Interstitial pulmonary disease, unspecified: Secondary | ICD-10-CM | POA: Diagnosis present

## 2023-09-10 DIAGNOSIS — E1122 Type 2 diabetes mellitus with diabetic chronic kidney disease: Secondary | ICD-10-CM | POA: Diagnosis present

## 2023-09-10 DIAGNOSIS — Z7952 Long term (current) use of systemic steroids: Secondary | ICD-10-CM

## 2023-09-10 DIAGNOSIS — Z833 Family history of diabetes mellitus: Secondary | ICD-10-CM

## 2023-09-10 DIAGNOSIS — Z87891 Personal history of nicotine dependence: Secondary | ICD-10-CM

## 2023-09-10 DIAGNOSIS — E119 Type 2 diabetes mellitus without complications: Secondary | ICD-10-CM

## 2023-09-10 DIAGNOSIS — Z7982 Long term (current) use of aspirin: Secondary | ICD-10-CM

## 2023-09-10 DIAGNOSIS — I9581 Postprocedural hypotension: Secondary | ICD-10-CM | POA: Diagnosis present

## 2023-09-10 DIAGNOSIS — Z79899 Other long term (current) drug therapy: Secondary | ICD-10-CM

## 2023-09-10 DIAGNOSIS — Z7902 Long term (current) use of antithrombotics/antiplatelets: Secondary | ICD-10-CM

## 2023-09-10 DIAGNOSIS — J9611 Chronic respiratory failure with hypoxia: Secondary | ICD-10-CM | POA: Diagnosis present

## 2023-09-10 DIAGNOSIS — I480 Paroxysmal atrial fibrillation: Secondary | ICD-10-CM | POA: Diagnosis not present

## 2023-09-10 DIAGNOSIS — Z7985 Long-term (current) use of injectable non-insulin antidiabetic drugs: Secondary | ICD-10-CM

## 2023-09-10 DIAGNOSIS — Z6833 Body mass index (BMI) 33.0-33.9, adult: Secondary | ICD-10-CM

## 2023-09-10 DIAGNOSIS — I340 Nonrheumatic mitral (valve) insufficiency: Secondary | ICD-10-CM | POA: Diagnosis present

## 2023-09-10 DIAGNOSIS — I10 Essential (primary) hypertension: Secondary | ICD-10-CM | POA: Diagnosis present

## 2023-09-10 DIAGNOSIS — G4730 Sleep apnea, unspecified: Secondary | ICD-10-CM | POA: Diagnosis present

## 2023-09-10 DIAGNOSIS — N1832 Chronic kidney disease, stage 3b: Secondary | ICD-10-CM | POA: Diagnosis present

## 2023-09-10 DIAGNOSIS — Z8249 Family history of ischemic heart disease and other diseases of the circulatory system: Secondary | ICD-10-CM

## 2023-09-10 DIAGNOSIS — E66811 Obesity, class 1: Secondary | ICD-10-CM | POA: Diagnosis present

## 2023-09-10 DIAGNOSIS — I2510 Atherosclerotic heart disease of native coronary artery without angina pectoris: Secondary | ICD-10-CM

## 2023-09-10 DIAGNOSIS — E785 Hyperlipidemia, unspecified: Secondary | ICD-10-CM | POA: Diagnosis present

## 2023-09-10 DIAGNOSIS — Z7984 Long term (current) use of oral hypoglycemic drugs: Secondary | ICD-10-CM

## 2023-09-10 DIAGNOSIS — G4733 Obstructive sleep apnea (adult) (pediatric): Secondary | ICD-10-CM | POA: Diagnosis present

## 2023-09-10 LAB — BASIC METABOLIC PANEL WITH GFR
Anion gap: 10 (ref 5–15)
BUN: 28 mg/dL — ABNORMAL HIGH (ref 8–23)
CO2: 27 mmol/L (ref 22–32)
Calcium: 9.3 mg/dL (ref 8.9–10.3)
Chloride: 103 mmol/L (ref 98–111)
Creatinine, Ser: 1.68 mg/dL — ABNORMAL HIGH (ref 0.61–1.24)
GFR, Estimated: 42 mL/min — ABNORMAL LOW (ref >60)
Glucose, Bld: 126 mg/dL — ABNORMAL HIGH (ref 70–99)
Potassium: 4.4 mmol/L (ref 3.5–5.1)
Sodium: 141 mmol/L (ref 135–145)

## 2023-09-10 LAB — CBC
HCT: 41.7 % (ref 39.0–52.0)
Hemoglobin: 14.7 g/dL (ref 13.0–17.0)
MCH: 31.8 pg (ref 26.0–34.0)
MCHC: 35.3 g/dL (ref 30.0–36.0)
MCV: 90.3 fL (ref 80.0–100.0)
Platelets: 247 10*3/uL (ref 150–400)
RBC: 4.62 MIL/uL (ref 4.22–5.81)
RDW: 12.2 % (ref 11.5–15.5)
WBC: 9.8 10*3/uL (ref 4.0–10.5)
nRBC: 0 % (ref 0.0–0.2)

## 2023-09-10 LAB — TROPONIN T, HIGH SENSITIVITY
Troponin T High Sensitivity: 127 ng/L (ref <19)
Troponin T High Sensitivity: 108 ng/L (ref <19)

## 2023-09-10 MED ORDER — DILTIAZEM HCL 25 MG/5ML IV SOLN
15.0000 mg | Freq: Once | INTRAVENOUS | Status: AC
  Administered 2023-09-10: 15 mg via INTRAVENOUS
  Filled 2023-09-10: qty 5

## 2023-09-10 MED ORDER — METOPROLOL TARTRATE 25 MG PO TABS
25.0000 mg | ORAL_TABLET | Freq: Once | ORAL | Status: AC
  Administered 2023-09-10: 25 mg via ORAL
  Filled 2023-09-10: qty 1

## 2023-09-10 MED ORDER — DILTIAZEM LOAD VIA INFUSION
15.0000 mg | Freq: Once | INTRAVENOUS | Status: AC
  Administered 2023-09-10: 15 mg via INTRAVENOUS
  Filled 2023-09-10: qty 15

## 2023-09-10 MED ORDER — DILTIAZEM HCL-DEXTROSE 125-5 MG/125ML-% IV SOLN (PREMIX)
5.0000 mg/h | INTRAVENOUS | Status: DC
  Administered 2023-09-10: 5 mg/h via INTRAVENOUS
  Administered 2023-09-11: 15 mg/h via INTRAVENOUS
  Administered 2023-09-11: 10 mg/h via INTRAVENOUS
  Administered 2023-09-12: 5 mg/h via INTRAVENOUS
  Filled 2023-09-10 (×4): qty 125

## 2023-09-10 MED ORDER — SODIUM CHLORIDE 0.9 % IV BOLUS
1000.0000 mL | Freq: Once | INTRAVENOUS | Status: AC
  Administered 2023-09-10: 1000 mL via INTRAVENOUS

## 2023-09-10 MED ORDER — SODIUM CHLORIDE 0.9 % IV BOLUS
500.0000 mL | Freq: Once | INTRAVENOUS | Status: AC
  Administered 2023-09-10: 500 mL via INTRAVENOUS

## 2023-09-10 NOTE — ED Notes (Signed)
 ED Provider at bedside.

## 2023-09-10 NOTE — ED Notes (Signed)
 Patient's heart rate remains at 168bpm. Dr Linder Revere aware. No new orders received

## 2023-09-10 NOTE — ED Notes (Signed)
 [facility] called for a hospitalist consult

## 2023-09-10 NOTE — ED Notes (Signed)
 Patients blood pressure 82/71 HR 168bpm. Dr Linder Revere made aware. New orders received for IVF

## 2023-09-10 NOTE — ED Notes (Signed)
 Dr. Linder Revere aware that troponin is 127.

## 2023-09-10 NOTE — ED Notes (Signed)
 Pt taken straight to room

## 2023-09-10 NOTE — ED Notes (Addendum)
ED Provider at bedside for eval.

## 2023-09-10 NOTE — ED Notes (Signed)
[facility] called for cardiology consult.

## 2023-09-10 NOTE — ED Triage Notes (Signed)
 Pt states that he began having chest pressure last night while sitting and checked his heart rate and it was 160's. Pt states that his chest pain has been intermittent but has continued throughout today, as well as his elevated heart rate. Denies SOB

## 2023-09-10 NOTE — ED Provider Notes (Signed)
 [employer] EMERGENCY DEPARTMENT AT [facility] Provider Note   CSN: [id number] Arrival date & time: 09/10/23  1926     History  Chief Complaint  Patient presents with   Chest Pain    Gary Keller is a 73 y.o. male.  This is a 73 year old male with chronic hypoxic respiratory failure on 3 L nasal cannula CAD presented to the emergency department for rapid heart rate.  Had some chest discomfort yesterday evening and noticed that his heart rate was elevated.  Chest pain resolved, but has had persistent tachycardia.  He is asymptomatic and states he feels his normal self other than feeling his heart is beating fast.  Denies headache, vision changes, lightheadedness, near-syncope, no current chest pain, baseline shortness of breath, no nausea vomiting, no abdominal pain.  Denies history of atrial fibrillation or atrial flutter   Chest Pain      Home Medications Prior to Admission medications   Medication Sig Start Date End Date Taking? Authorizing Provider  acetaminophen (TYLENOL) 325 MG tablet Take by mouth.    [provider]  albuterol  (VENTOLIN  HFA) 108 (90 Base) MCG/ACT inhaler INHALE 2 PUFFS INTO THE LUNGS EVERY 6 HOURS AS NEEDED FOR WHEEZING OR SHORTNESS OF BREATH 10/28/22   Lind Repine, MD  amLODipine  (NORVASC ) 5 MG tablet TAKE 1 TABLET(5 MG) BY MOUTH DAILY 02/22/21   Lenise Quince, MD  atorvastatin  (LIPITOR) 80 MG tablet TAKE 1 TABLET(80 MG) BY MOUTH DAILY 02/03/23   Lenise Quince, MD  clopidogrel (PLAVIX) 75 MG tablet Take 75 mg by mouth daily.    [provider]  famotidine (PEPCID) 40 MG tablet Take 40 mg by mouth 2 (two) times daily. 04/29/23   [provider]  fenofibrate 160 MG tablet Take 160 mg by mouth daily.  01/11/13   [provider]  losartan  (COZAAR ) 100 MG tablet Take 1 tablet (100 mg total) by mouth daily. 03/23/21   Lenise Quince, MD  metoprolol  succinate (TOPROL -XL) 50 MG 24 hr tablet Take 50 mg  by mouth daily.    [provider]  MOUNJARO 10 MG/0.5ML Pen Inject 10 mg into the skin once a week.    [provider]  omeprazole (PRILOSEC) 40 MG capsule Take 40 mg by mouth daily. 04/29/23   [provider]  Pirfenidone  (ESBRIET ) 267 MG CAPS Take 801 mg by mouth in the morning, at noon, and at bedtime. 08/28/23   Alva, Rakesh V, MD  predniSONE  (DELTASONE ) 10 MG tablet Take 10 mg by mouth. Take Monday, Wednesday and Friday 11/29/22   [provider]  predniSONE  (DELTASONE ) 5 MG tablet Take Monday, Wednesday and friday 02/06/23   Lind Repine, MD  VASCEPA  1 g capsule Take 2 capsules (2 g total) by mouth 2 (two) times daily. 08/16/23   Walker, Caitlin S, NP  venlafaxine XR (EFFEXOR-XR) 75 MG 24 hr capsule Take 75 mg by mouth daily with breakfast.  09/03/09   [provider]      Allergies    Patient has no known allergies.    Review of Systems   Review of Systems  Cardiovascular:  Positive for chest pain.    Physical Exam Updated Vital Signs BP (!) 93/59   Pulse (!) 108   Temp 98.4 F (36.9 C) (Oral)   Resp (!) 24   SpO2 91%  Physical Exam Vitals and nursing note reviewed.  Constitutional:      General: He is not in acute  distress.    Appearance: He is not toxic-appearing.  HENT:     Head: Normocephalic.  Cardiovascular:     Rate and Rhythm: Normal rate and regular rhythm.     Pulses:          Radial pulses are 2+ on the right side and 2+ on the left side.     Heart sounds: Normal heart sounds.  Pulmonary:     Breath sounds: Normal breath sounds.     Comments: Coarse breath sounds Musculoskeletal:     Right lower leg: No edema.     Left lower leg: No edema.  Skin:    General: Skin is warm and dry.     Capillary Refill: Capillary refill takes less than 2 seconds.  Neurological:     General: No focal deficit present.     Mental Status: He is alert.  Psychiatric:        Mood and Affect: Mood normal.        Behavior: Behavior  normal.     ED Results / Procedures / Treatments   Labs (all labs ordered are listed, but only abnormal results are displayed) Labs Reviewed  BASIC METABOLIC PANEL WITH GFR - Abnormal; Notable for the following components:      Result Value   Glucose, Bld 126 (*)    BUN 28 (*)    Creatinine, Ser 1.68 (*)    GFR, Estimated 42 (*)    All other components within normal limits  TROPONIN T, HIGH SENSITIVITY - Abnormal; Notable for the following components:   Troponin T High Sensitivity 127 (*)    All other components within normal limits  TROPONIN T, HIGH SENSITIVITY - Abnormal; Notable for the following components:   Troponin T High Sensitivity 108 (*)    All other components within normal limits  CBC    EKG EKG Interpretation Date/Time:  Sunday September 10 2023 21:10:35 EDT Ventricular Rate:  86 PR Interval:    QRS Duration:  119 QT Interval:  422 QTC Calculation: 505 R Axis:   -45  Text Interpretation: Atrial fibrillation Incomplete RBBB and LAFB Nonspecific T abnormalities, lateral leads Confirmed by Elise Guile [phone]) on 09/10/2023 10:09:39 PM  Radiology DG Chest Port 1 View Result Date: 09/10/2023 CLINICAL DATA:  [id number] Chest pain [id number] EXAM: PORTABLE CHEST - 1 VIEW COMPARISON:  08/25/2022. FINDINGS: The heart size and mediastinal contours are within normal limits. There is diffuse pulmonary interstitial prominence which could be seen with atypical infection, edema or interstitial lung disease. There is no focal consolidation. No pneumothorax or pleural effusion. There are thoracic degenerative changes. IMPRESSION: Nonspecific interstitial prominence consistent with atypical infection, edema or interstitial lung disease. No focal consolidation. Electronically Signed   By: Sydell Eva M.D.   On: 09/10/2023 20:49    Procedures .Critical Care  Performed by: Rolinda Climes, DO Authorized by: Rolinda Climes, DO   Critical care provider statement:    Critical care  time (minutes):  75   Critical care time was exclusive of:  Separately billable procedures and treating other patients   Critical care was necessary to treat or prevent imminent or life-threatening deterioration of the following conditions:  Cardiac failure and circulatory failure   Critical care was time spent personally by me on the following activities:  Development of treatment plan with patient or surrogate, discussions with consultants, evaluation of patient's response to treatment, examination of patient, ordering and review of laboratory studies, ordering and review of radiographic studies,  ordering and performing treatments and interventions, pulse oximetry, re-evaluation of patient's condition and review of old charts   I assumed direction of critical care for this patient from another provider in my specialty: no     Care discussed with: admitting provider     Care discussed with comment:  And with cardiology     Medications Ordered in ED Medications  diltiazem (CARDIZEM) 1 mg/mL load via infusion 15 mg (15 mg Intravenous Bolus from Bag 09/10/23 2059)    And  diltiazem (CARDIZEM) 125 mg in dextrose 5% 125 mL (1 mg/mL) infusion (15 mg/hr Intravenous Rate/Dose Change 09/10/23 2200)  sodium chloride 0.9 % bolus 1,000 mL (0 mLs Intravenous Stopped 09/10/23 2120)  diltiazem (CARDIZEM) injection 15 mg (15 mg Intravenous Given 09/10/23 2023)  sodium chloride 0.9 % bolus 1,000 mL (1,000 mLs Intravenous New Bag/Given 09/10/23 2208)  sodium chloride 0.9 % bolus 500 mL (0 mLs Intravenous Stopped 09/10/23 2244)  diltiazem (CARDIZEM) injection 15 mg (15 mg Intravenous Given 09/10/23 2235)  metoprolol  tartrate (LOPRESSOR ) tablet 25 mg (25 mg Oral Given 09/10/23 2234)    ED Course/ Medical Decision Making/ A&P Clinical Course as of 09/10/23 2332  Sun Sep 10, 2023  2031 Troponin T High Sensitivity(!!): 127 Likely demand [TY]  2032 Basic metabolic panel(!) Baseline function  [TY]  2032 WBC: 9.8 [TY]   2227 [facility]; admit to medicine. Cont dilt drip; repeat dilt push and add 25mg  metop. Cardiovert if BP drops further.  [TY]  2302 Dr. Achilles Holes with cards accepts to [facility] cone.  [TY]    Clinical Course User Index [TY] Rolinda Climes, DO                                 Medical Decision Making This is a 73 year old male presenting the emergency department with rapid heart rate.  SVT versus A-fib with RVR on initial EKG.  Patient asymptomatic has had some soft blood pressures, but hemodynamically stable.  He is asymptomatic not having chest pain not having shortness of breath lightheadedness.  He is on his baseline oxygen requirements.  Does have a mildly elevated troponin at 127 that down trended to 108.  I suspect secondary to his rate as the symptoms have been ongoing for at least 24 hours now.  He has no significant metabolic derangements.  Mildly elevated creatinine, compared to prior.  Appears his baseline is around 1.5.  He is 1.68 currently.  He has no markers of infection.  His chest x-ray appears to be at baseline.  He is low risk for PE based on Wells criteria.  Given diltiazem bolus with no improvement, given repeat bolus and started on drip with improvement of heart rate, then lost control with increased heart rate.  Case discussed with cardiology; see ED course.  After third bolus of diltiazem and oral metoprolol  patient's rate is better controlled, but is at 15 mg diltiazem drip.  Admit to hospitalist with cardiology consult.  Case discussed with Dr. Achilles Holes for admission.  Amount and/or Complexity of Data Reviewed Independent Historian:     Details: Family members note he complained of some chest discomfort last night External Data Reviewed:     Details: Does not appear to have prior history of atrial fibrillation and is not on Eliquis.  No prior echo on file and no documented history of CHF. Labs: ordered. Decision-making details documented in ED Course. Radiology:  ordered and  independent interpretation performed.    Details: Chest x-ray appears similar to prior.  No obvious pneumothorax ECG/medicine tests: ordered and independent interpretation performed.    Details: Initial EKG SVT versus atrial fibrillation, I do not appreciate ST segment changes to indicate ischemia.  After diltiazem repeat EKG with sawtooth pattern consistent with atrial flutter.  Again, no ischemic changes noted Discussion of management or test interpretation with external provider(s): Cardiology, hospitalist  Risk Prescription drug management. Decision regarding hospitalization. Diagnosis or treatment significantly limited by social determinants of health. Risk Details: Poor health literacy          Final Clinical Impression(s) / ED Diagnoses Final diagnoses:  Paroxysmal atrial fibrillation [facility])    Rx / DC Orders ED Discharge Orders     None         Rolinda Climes, DO 09/10/23 2332

## 2023-09-11 ENCOUNTER — Telehealth (HOSPITAL_COMMUNITY): Payer: Self-pay | Admitting: Pharmacy Technician

## 2023-09-11 ENCOUNTER — Other Ambulatory Visit: Payer: Self-pay

## 2023-09-11 ENCOUNTER — Encounter (HOSPITAL_COMMUNITY): Payer: Self-pay | Admitting: Family Medicine

## 2023-09-11 ENCOUNTER — Inpatient Hospital Stay (HOSPITAL_COMMUNITY)

## 2023-09-11 ENCOUNTER — Other Ambulatory Visit (HOSPITAL_COMMUNITY): Payer: Self-pay

## 2023-09-11 DIAGNOSIS — I2510 Atherosclerotic heart disease of native coronary artery without angina pectoris: Secondary | ICD-10-CM | POA: Diagnosis present

## 2023-09-11 DIAGNOSIS — E785 Hyperlipidemia, unspecified: Secondary | ICD-10-CM | POA: Diagnosis present

## 2023-09-11 DIAGNOSIS — I4892 Unspecified atrial flutter: Secondary | ICD-10-CM | POA: Diagnosis not present

## 2023-09-11 DIAGNOSIS — E1122 Type 2 diabetes mellitus with diabetic chronic kidney disease: Secondary | ICD-10-CM | POA: Diagnosis present

## 2023-09-11 DIAGNOSIS — J8410 Pulmonary fibrosis, unspecified: Secondary | ICD-10-CM | POA: Diagnosis present

## 2023-09-11 DIAGNOSIS — Z87891 Personal history of nicotine dependence: Secondary | ICD-10-CM | POA: Diagnosis not present

## 2023-09-11 DIAGNOSIS — I2489 Other forms of acute ischemic heart disease: Secondary | ICD-10-CM | POA: Diagnosis present

## 2023-09-11 DIAGNOSIS — J9611 Chronic respiratory failure with hypoxia: Secondary | ICD-10-CM | POA: Diagnosis present

## 2023-09-11 DIAGNOSIS — I483 Typical atrial flutter: Secondary | ICD-10-CM | POA: Diagnosis present

## 2023-09-11 DIAGNOSIS — E119 Type 2 diabetes mellitus without complications: Secondary | ICD-10-CM

## 2023-09-11 DIAGNOSIS — Z6833 Body mass index (BMI) 33.0-33.9, adult: Secondary | ICD-10-CM | POA: Diagnosis not present

## 2023-09-11 DIAGNOSIS — I4891 Unspecified atrial fibrillation: Secondary | ICD-10-CM

## 2023-09-11 DIAGNOSIS — Z77098 Contact with and (suspected) exposure to other hazardous, chiefly nonmedicinal, chemicals: Secondary | ICD-10-CM | POA: Diagnosis present

## 2023-09-11 DIAGNOSIS — Z7984 Long term (current) use of oral hypoglycemic drugs: Secondary | ICD-10-CM | POA: Diagnosis not present

## 2023-09-11 DIAGNOSIS — Z7902 Long term (current) use of antithrombotics/antiplatelets: Secondary | ICD-10-CM | POA: Diagnosis not present

## 2023-09-11 DIAGNOSIS — Z9861 Coronary angioplasty status: Secondary | ICD-10-CM | POA: Diagnosis not present

## 2023-09-11 DIAGNOSIS — Z7901 Long term (current) use of anticoagulants: Secondary | ICD-10-CM | POA: Diagnosis not present

## 2023-09-11 DIAGNOSIS — Z79899 Other long term (current) drug therapy: Secondary | ICD-10-CM | POA: Diagnosis not present

## 2023-09-11 DIAGNOSIS — N1831 Chronic kidney disease, stage 3a: Secondary | ICD-10-CM | POA: Diagnosis present

## 2023-09-11 DIAGNOSIS — I340 Nonrheumatic mitral (valve) insufficiency: Secondary | ICD-10-CM | POA: Diagnosis present

## 2023-09-11 DIAGNOSIS — I129 Hypertensive chronic kidney disease with stage 1 through stage 4 chronic kidney disease, or unspecified chronic kidney disease: Secondary | ICD-10-CM | POA: Diagnosis present

## 2023-09-11 DIAGNOSIS — I959 Hypotension, unspecified: Secondary | ICD-10-CM | POA: Diagnosis not present

## 2023-09-11 DIAGNOSIS — G4730 Sleep apnea, unspecified: Secondary | ICD-10-CM | POA: Diagnosis present

## 2023-09-11 DIAGNOSIS — I10 Essential (primary) hypertension: Secondary | ICD-10-CM | POA: Diagnosis not present

## 2023-09-11 DIAGNOSIS — E66811 Obesity, class 1: Secondary | ICD-10-CM | POA: Diagnosis present

## 2023-09-11 DIAGNOSIS — G4733 Obstructive sleep apnea (adult) (pediatric): Secondary | ICD-10-CM | POA: Diagnosis present

## 2023-09-11 DIAGNOSIS — Z7952 Long term (current) use of systemic steroids: Secondary | ICD-10-CM | POA: Diagnosis not present

## 2023-09-11 DIAGNOSIS — Z9981 Dependence on supplemental oxygen: Secondary | ICD-10-CM | POA: Diagnosis not present

## 2023-09-11 DIAGNOSIS — I480 Paroxysmal atrial fibrillation: Secondary | ICD-10-CM | POA: Diagnosis present

## 2023-09-11 LAB — SURGICAL PCR SCREEN
MRSA, PCR: NEGATIVE
Staphylococcus aureus: NEGATIVE

## 2023-09-11 LAB — BASIC METABOLIC PANEL WITH GFR
Anion gap: 6 (ref 5–15)
BUN: 20 mg/dL (ref 8–23)
CO2: 23 mmol/L (ref 22–32)
Calcium: 8.4 mg/dL — ABNORMAL LOW (ref 8.9–10.3)
Chloride: 110 mmol/L (ref 98–111)
Creatinine, Ser: 1.22 mg/dL (ref 0.61–1.24)
GFR, Estimated: >60 mL/min (ref >60)
Glucose, Bld: 94 mg/dL (ref 70–99)
Potassium: 3.7 mmol/L (ref 3.5–5.1)
Sodium: 139 mmol/L (ref 135–145)

## 2023-09-11 LAB — HEPATIC FUNCTION PANEL
ALT: 16 U/L (ref 0–44)
AST: 20 U/L (ref 15–41)
Albumin: 3.2 g/dL — ABNORMAL LOW (ref 3.5–5.0)
Alkaline Phosphatase: 71 U/L (ref 38–126)
Bilirubin, Direct: 0.2 mg/dL (ref 0.0–0.2)
Indirect Bilirubin: 0.5 mg/dL (ref 0.3–0.9)
Total Bilirubin: 0.7 mg/dL (ref 0.0–1.2)
Total Protein: 5.8 g/dL — ABNORMAL LOW (ref 6.5–8.1)

## 2023-09-11 LAB — CBC WITH DIFFERENTIAL/PLATELET
Abs Immature Granulocytes: 0.02 10*3/uL (ref 0.00–0.07)
Basophils Absolute: 0.1 10*3/uL (ref 0.0–0.1)
Basophils Relative: 1 %
Eosinophils Absolute: 0.1 10*3/uL (ref 0.0–0.5)
Eosinophils Relative: 1 %
HCT: 38.8 % — ABNORMAL LOW (ref 39.0–52.0)
Hemoglobin: 13.5 g/dL (ref 13.0–17.0)
Immature Granulocytes: 0 %
Lymphocytes Relative: 31 %
Lymphs Abs: 2.9 10*3/uL (ref 0.7–4.0)
MCH: 31.6 pg (ref 26.0–34.0)
MCHC: 34.8 g/dL (ref 30.0–36.0)
MCV: 90.9 fL (ref 80.0–100.0)
Monocytes Absolute: 0.7 10*3/uL (ref 0.1–1.0)
Monocytes Relative: 7 %
Neutro Abs: 5.8 10*3/uL (ref 1.7–7.7)
Neutrophils Relative %: 60 %
Platelets: 205 10*3/uL (ref 150–400)
RBC: 4.27 MIL/uL (ref 4.22–5.81)
RDW: 12.5 % (ref 11.5–15.5)
WBC: 9.6 10*3/uL (ref 4.0–10.5)
nRBC: 0 % (ref 0.0–0.2)

## 2023-09-11 LAB — ECHOCARDIOGRAM COMPLETE
AR max vel: 1.85 cm2
AV Area VTI: 1.85 cm2
AV Area mean vel: 1.75 cm2
AV Mean grad: 2 mmHg
AV Peak grad: 3.8 mmHg
Ao pk vel: 0.98 m/s
Area-P 1/2: 5.56 cm2
Height: 62 in
S' Lateral: 2.5 cm
Weight: 2913.6 [oz_av]

## 2023-09-11 LAB — GLUCOSE, CAPILLARY
Glucose-Capillary: 91 mg/dL (ref 70–99)
Glucose-Capillary: 143 mg/dL — ABNORMAL HIGH (ref 70–99)
Glucose-Capillary: 110 mg/dL — ABNORMAL HIGH (ref 70–99)
Glucose-Capillary: 106 mg/dL — ABNORMAL HIGH (ref 70–99)

## 2023-09-11 LAB — TSH: TSH: 3.253 u[IU]/mL (ref 0.350–4.500)

## 2023-09-11 MED ORDER — HEPARIN BOLUS VIA INFUSION
4000.0000 [IU] | Freq: Once | INTRAVENOUS | Status: AC
Start: 2023-09-11 — End: 2023-09-11
  Administered 2023-09-11: 4000 [IU] via INTRAVENOUS
  Filled 2023-09-11: qty 4000

## 2023-09-11 MED ORDER — PIRFENIDONE 267 MG PO TABS
801.0000 mg | ORAL_TABLET | Freq: Three times a day (TID) | ORAL | Status: DC
  Administered 2023-09-11 – 2023-09-13 (×6): 801 mg via ORAL
  Filled 2023-09-11 (×11): qty 3

## 2023-09-11 MED ORDER — FENOFIBRATE 160 MG PO TABS
160.0000 mg | ORAL_TABLET | Freq: Every day | ORAL | Status: DC
  Administered 2023-09-11 – 2023-09-13 (×2): 160 mg via ORAL
  Filled 2023-09-11 (×3): qty 1

## 2023-09-11 MED ORDER — HEPARIN (PORCINE) 25000 UT/250ML-% IV SOLN
1050.0000 [IU]/h | INTRAVENOUS | Status: DC
  Administered 2023-09-11: 1050 [IU]/h via INTRAVENOUS
  Filled 2023-09-11: qty 250

## 2023-09-11 MED ORDER — PIRFENIDONE 267 MG PO CAPS
801.0000 mg | ORAL_CAPSULE | Freq: Three times a day (TID) | ORAL | Status: DC
  Filled 2023-09-11 (×3): qty 3

## 2023-09-11 MED ORDER — APIXABAN 5 MG PO TABS
5.0000 mg | ORAL_TABLET | Freq: Two times a day (BID) | ORAL | Status: DC
  Administered 2023-09-11 – 2023-09-13 (×5): 5 mg via ORAL
  Filled 2023-09-11 (×5): qty 1

## 2023-09-11 MED ORDER — LOSARTAN POTASSIUM 50 MG PO TABS
100.0000 mg | ORAL_TABLET | Freq: Every day | ORAL | Status: DC
  Filled 2023-09-11: qty 2

## 2023-09-11 MED ORDER — PREDNISONE 5 MG PO TABS: 5.0000 mg | ORAL_TABLET | ORAL | Status: DC

## 2023-09-11 MED ORDER — INSULIN ASPART 100 UNIT/ML IJ SOLN
0.0000 [IU] | Freq: Three times a day (TID) | INTRAMUSCULAR | Status: DC
  Administered 2023-09-11 – 2023-09-12 (×2): 1 [IU] via SUBCUTANEOUS
  Administered 2023-09-13: 2 [IU] via SUBCUTANEOUS

## 2023-09-11 MED ORDER — PREDNISONE 10 MG PO TABS
5.0000 mg | ORAL_TABLET | ORAL | Status: DC
Start: 2023-09-12 — End: 2023-09-11

## 2023-09-11 MED ORDER — PREDNISONE 10 MG PO TABS: 5.0000 mg | ORAL_TABLET | ORAL | Status: DC

## 2023-09-11 MED ORDER — PIRFENIDONE 267 MG PO CAPS: 801.0000 mg | ORAL_CAPSULE | Freq: Every day | ORAL | Status: DC

## 2023-09-11 MED ORDER — PREDNISONE 10 MG PO TABS: 10.0000 mg | ORAL_TABLET | ORAL | Status: DC

## 2023-09-11 MED ORDER — VENLAFAXINE HCL ER 75 MG PO CP24
75.0000 mg | ORAL_CAPSULE | Freq: Every day | ORAL | Status: DC
  Administered 2023-09-11 – 2023-09-13 (×2): 75 mg via ORAL
  Filled 2023-09-11 (×3): qty 1

## 2023-09-11 MED ORDER — METOPROLOL SUCCINATE ER 50 MG PO TB24
50.0000 mg | ORAL_TABLET | Freq: Every day | ORAL | Status: DC
  Administered 2023-09-11 – 2023-09-13 (×2): 50 mg via ORAL
  Filled 2023-09-11 (×2): qty 1

## 2023-09-11 MED ORDER — ATORVASTATIN CALCIUM 80 MG PO TABS
80.0000 mg | ORAL_TABLET | Freq: Every day | ORAL | Status: DC
  Administered 2023-09-11 – 2023-09-13 (×2): 80 mg via ORAL
  Filled 2023-09-11 (×2): qty 1

## 2023-09-11 MED ORDER — PANTOPRAZOLE SODIUM 40 MG PO TBEC
40.0000 mg | DELAYED_RELEASE_TABLET | Freq: Every day | ORAL | Status: DC
  Administered 2023-09-11 – 2023-09-13 (×2): 40 mg via ORAL
  Filled 2023-09-11 (×2): qty 1

## 2023-09-11 MED ORDER — ICOSAPENT ETHYL 1 G PO CAPS
2.0000 g | ORAL_CAPSULE | Freq: Two times a day (BID) | ORAL | Status: DC
  Administered 2023-09-11 – 2023-09-13 (×4): 2 g via ORAL
  Filled 2023-09-11 (×5): qty 2

## 2023-09-11 NOTE — Progress Notes (Signed)
*  PRELIMINARY RESULTS* Echocardiogram 2D Echocardiogram has been performed.  Gary Keller 09/11/2023, 3:30 PM

## 2023-09-11 NOTE — Telephone Encounter (Signed)
 Patient Product/process development scientist completed.    The patient is insured through [facility]. Patient has Medicare and is not eligible for a copay card, but may be able to apply for patient assistance or Medicare RX Payment Plan (Patient Must reach out to their plan, if eligible for payment plan), if available.    Ran test claim for Eliquis 5 mg and the current 30 day co-pay is $47.00.   This test claim was processed through [facility]- copay amounts may vary at other pharmacies due to pharmacy/plan contracts, or as the patient moves through the different stages of their insurance plan.     Gary Keller, CPHT Pharmacy Technician III Certified Patient Advocate [facility] Patient Advocate Team Direct Number: [phone]  Fax: [phone]

## 2023-09-11 NOTE — Progress Notes (Signed)
   09/11/23 0245  Assess: MEWS Score  Temp (!) 97.5 F (36.4 C)  BP (!) 115/56  MAP (mmHg) 74  Pulse Rate (!) 125  ECG Heart Rate (!) 125  Resp 20  Level of Consciousness Alert  SpO2 94 %  O2 Device Nasal Cannula  O2 Flow Rate (L/min) 3 L/min  Assess: MEWS Score  MEWS Temp 0  MEWS Systolic 0  MEWS Pulse 2  MEWS RR 0  MEWS LOC 0  MEWS Score 2  MEWS Score Color Yellow  Assess: if the MEWS score is Yellow or Red  Were vital signs accurate and taken at a resting state? Yes  Does the patient meet 2 or more of the SIRS criteria? No  MEWS guidelines implemented  Yes, yellow  Treat  MEWS Interventions Considered administering scheduled or prn medications/treatments as ordered  Take Vital Signs  Increase Vital Sign Frequency  Yellow: Q2hr x1, continue Q4hrs until patient remains green for 12hrs  Escalate  MEWS: Escalate Yellow: Discuss with charge nurse and consider notifying provider and/or RRT  Notify: Charge Nurse/RN  Name of Charge Nurse/RN Notified Tanya, RN  Assess: SIRS CRITERIA  SIRS Temperature  0  SIRS Respirations  0  SIRS Pulse 1  SIRS WBC 0  SIRS Score Sum  1

## 2023-09-11 NOTE — Progress Notes (Signed)
   09/11/23 2246  Vitals  Temp 97.8 F (36.6 C)  Temp Source Oral  BP (!) 103/58  MAP (mmHg) 71  BP Location Left Arm  BP Method Automatic  Patient Position (if appropriate) Lying  Pulse Rate 83  Pulse Rate Source Monitor  ECG Heart Rate (!) 131  Resp 20  MEWS COLOR  MEWS Score Color Yellow  Oxygen Therapy  SpO2 93 %  O2 Device CPAP  MEWS Score  MEWS Temp 0  MEWS Systolic 0  MEWS Pulse 3  MEWS RR 0  MEWS LOC 0  MEWS Score 3   Cardiology MD notified of aflutter rate going ranging up to 170 after cardizem drip had been maxed at 15mg /hr. Patient asymptomatic. No new orders unless patient becomes asymptomatic or BP begins dropping further. Patient scheduled for TEE with cardioversion tomorrow.

## 2023-09-11 NOTE — ED Notes (Signed)
 [facility] notified that bed is ready.

## 2023-09-11 NOTE — Progress Notes (Signed)
 Home medication Pirfenidone  (Esbriet ) taken to pharmacy. Verification sheet placed in patients chart.

## 2023-09-11 NOTE — H&P (Signed)
 . History and Physical    Gary Keller ZOX:[id number] DOB: November 26, 1950 DOA: 09/10/2023  Patient coming from: Home.  Chief Complaint: Palpitations.  HPI: Gary Keller is a 74 y.o. male with history of CAD status post PCI, interstitial lung disease, sleep apnea, hypertension, chronic kidney disease stage III, diabetes mellitus type 2, depression presents to the ER after patient has been having persistent palpitation for the last 48 hours.  Patient states that his symptoms were present even at rest.  He has been having some chest pressure along with it.  Denies any change in medications.  Denies any nausea vomiting abdominal pain diarrhea shortness of breath.  ED Course: In the ER patient is found to be in atrial flutter with RVR.  Was started on Cardizem infusion.  Labs show creatinine 1.6 hemoglobin 14.7.  Troponins are 127 and 108.  Cardiology on-call was consulted.  Patient admitted for new onset atrial flutter with RVR.  Review of Systems: As per HPI, rest all negative.   Past Medical History:  Diagnosis Date   CAD (coronary artery disease)    Depression    Diabetes mellitus (HCC)    Borderline   Encounter for screening for malignant neoplasm of prostate    HTN (hypertension)    Hyperlipidemia    Non morbid obesity due to excess calories    Renal insufficiency    Rhinitis, allergic     Past Surgical History:  Procedure Laterality Date   APPENDECTOMY     COLONOSCOPY WITH ESOPHAGOGASTRODUODENOSCOPY (EGD)     LEFT HEART CATH  2006   Stent x3     reports that he quit smoking about 22 years ago. His smoking use included cigarettes. He started smoking about 65 years ago. He has a 43 pack-year smoking history. He has never used smokeless tobacco. He reports current alcohol use. He reports that he does not use drugs.  No Known Allergies  Family History  Problem Relation Age of Onset   Diabetes Brother    Heart disease Brother    Heart disease Father    Hypertension  Brother    Stroke Mother    Stroke Brother    Lung disease Neg Hx    Rheumatologic disease Neg Hx     Prior to Admission medications   Medication Sig Start Date End Date Taking? Authorizing Provider  acetaminophen (TYLENOL) 325 MG tablet Take by mouth.    [provider]  albuterol  (VENTOLIN  HFA) 108 (90 Base) MCG/ACT inhaler INHALE 2 PUFFS INTO THE LUNGS EVERY 6 HOURS AS NEEDED FOR WHEEZING OR SHORTNESS OF BREATH 10/28/22   Lind Repine, MD  amLODipine  (NORVASC ) 5 MG tablet TAKE 1 TABLET(5 MG) BY MOUTH DAILY 02/22/21   Lenise Quince, MD  atorvastatin  (LIPITOR) 80 MG tablet TAKE 1 TABLET(80 MG) BY MOUTH DAILY 02/03/23   Lenise Quince, MD  clopidogrel (PLAVIX) 75 MG tablet Take 75 mg by mouth daily.    [provider]  famotidine (PEPCID) 40 MG tablet Take 40 mg by mouth 2 (two) times daily. 04/29/23   [provider]  fenofibrate 160 MG tablet Take 160 mg by mouth daily.  01/11/13   [provider]  losartan  (COZAAR ) 100 MG tablet Take 1 tablet (100 mg total) by mouth daily. 03/23/21   Lenise Quince, MD  metoprolol  succinate (TOPROL -XL) 50 MG 24 hr tablet Take 50 mg by mouth daily.    [provider]  MOUNJARO 10 MG/0.5ML Pen Inject 10  mg into the skin once a week.    [provider]  omeprazole (PRILOSEC) 40 MG capsule Take 40 mg by mouth daily. 04/29/23   [provider]  Pirfenidone  (ESBRIET ) 267 MG CAPS Take 801 mg by mouth in the morning, at noon, and at bedtime. 08/28/23   Alva, Rakesh V, MD  predniSONE  (DELTASONE ) 10 MG tablet Take 10 mg by mouth. Take Monday, Wednesday and Friday 11/29/22   [provider]  predniSONE  (DELTASONE ) 5 MG tablet Take Monday, Wednesday and friday 02/06/23   Lind Repine, MD  VASCEPA  1 g capsule Take 2 capsules (2 g total) by mouth 2 (two) times daily. 08/16/23   Walker, Caitlin S, NP  venlafaxine XR (EFFEXOR-XR) 75 MG 24 hr capsule Take 75 mg by mouth daily with breakfast.   09/03/09   [provider]    Physical Exam: Constitutional: Moderately built and nourished. Vitals:   09/11/23 0100 09/11/23 0200 09/11/23 0245 09/11/23 0500  BP: 102/67 99/62 (!) 115/56 108/63  Pulse: 86 87 (!) 125 85  Resp: 16 (!) 22 20 18   Temp:  98.7 F (37.1 C) (!) 97.5 F (36.4 C) 97.7 F (36.5 C)  TempSrc:  Oral Oral Oral  SpO2: 90% 93% 94% 96%  Weight:   82.6 kg   Height:   5\' 2"  (1.575 m)    Eyes: Anicteric no pallor. ENMT: No discharge from the ears/nose or mouth. Neck: No mass felt.  No neck rigidity. Respiratory: No rhonchi or crepitations. Cardiovascular: S1-S2 heard. Abdomen: Soft nontender bowel sound present. Musculoskeletal: No edema. Skin: No rash. Neurologic: Alert awake oriented to time place and person.  Moves all extremities. Psychiatric: Appears normal.  Normal affect.   Labs on Admission: I have personally reviewed following labs and imaging studies  CBC: Recent Labs  Lab 09/10/23 1936  WBC 9.8  HGB 14.7  HCT 41.7  MCV 90.3  PLT 247   Basic Metabolic Panel: Recent Labs  Lab 09/10/23 1936  NA 141  K 4.4  CL 103  CO2 27  GLUCOSE 126*  BUN 28*  CREATININE 1.68*  CALCIUM  9.3   GFR: Estimated Creatinine Clearance: 35.9 mL/min (A) (by C-G formula based on SCr of 1.68 mg/dL (H)). Liver Function Tests: No results for input(s): "AST", "ALT", "ALKPHOS", "BILITOT", "PROT", "ALBUMIN" in the last 168 hours. No results for input(s): "LIPASE", "AMYLASE" in the last 168 hours. No results for input(s): "AMMONIA" in the last 168 hours. Coagulation Profile: No results for input(s): "INR", "PROTIME" in the last 168 hours. Cardiac Enzymes: No results for input(s): "CKTOTAL", "CKMB", "CKMBINDEX", "TROPONINI" in the last 168 hours. BNP (last 3 results) No results for input(s): "PROBNP" in the last 8760 hours. HbA1C: No results for input(s): "HGBA1C" in the last 72 hours. CBG: No results for input(s): "GLUCAP" in the last 168  hours. Lipid Profile: No results for input(s): "CHOL", "HDL", "LDLCALC", "TRIG", "CHOLHDL", "LDLDIRECT" in the last 72 hours. Thyroid Function Tests: No results for input(s): "TSH", "T4TOTAL", "FREET4", "T3FREE", "THYROIDAB" in the last 72 hours. Anemia Panel: No results for input(s): "VITAMINB12", "FOLATE", "FERRITIN", "TIBC", "IRON", "RETICCTPCT" in the last 72 hours. Urine analysis: No results found for: "COLORURINE", "APPEARANCEUR", "LABSPEC", "PHURINE", "GLUCOSEU", "HGBUR", "BILIRUBINUR", "KETONESUR", "PROTEINUR", "UROBILINOGEN", "NITRITE", "LEUKOCYTESUR" Sepsis Labs: @LABRCNTIP (procalcitonin:4,lacticidven:4) ) Recent Results (from the past 240 hours)  Surgical pcr screen     Status: None   Collection Time: 09/11/23  2:53 AM   Specimen: Nasal Mucosa; Nasal Swab  Result Value Ref Range Status  MRSA, PCR NEGATIVE NEGATIVE Final   Staphylococcus aureus NEGATIVE NEGATIVE Final    Comment: (NOTE) The Xpert SA Assay (FDA approved for NASAL specimens in patients 41 years of age and older), is one component of a comprehensive surveillance program. It is not intended to diagnose infection nor to guide or monitor treatment. Performed at [facility], [address], [place], [place] [address]      Radiological Exams on Admission: DG Chest Port 1 View Result Date: 09/10/2023 CLINICAL DATA:  [id number] Chest pain [id number] EXAM: PORTABLE CHEST - 1 VIEW COMPARISON:  08/25/2022. FINDINGS: The heart size and mediastinal contours are within normal limits. There is diffuse pulmonary interstitial prominence which could be seen with atypical infection, edema or interstitial lung disease. There is no focal consolidation. No pneumothorax or pleural effusion. There are thoracic degenerative changes. IMPRESSION: Nonspecific interstitial prominence consistent with atypical infection, edema or interstitial lung disease. No focal consolidation. Electronically Signed   By: Sydell Eva M.D.   On:  09/10/2023 20:49    EKG: Independently reviewed.  Atrial flutter with RVR.  Assessment/Plan Principal Problem:   Atrial flutter with rapid ventricular response (HCC) Active Problems:   Essential hypertension   CAD S/P PCI 2010   Non-insulin dependent type 2 diabetes mellitus (HCC)   HTN (hypertension)   Dyslipidemia, goal LDL below 70   ILD (interstitial lung disease) (HCC)   OSA (obstructive sleep apnea)   Chronic kidney disease (CKD) stage G3b/A2, moderately decreased glomerular filtration rate (GFR) between 30-44 mL/min/1.73 square meter and albuminuria creatinine ratio between 30-299 mg/g (HCC)   Atrial fibrillation with rapid ventricular response (HCC)    Atrial flutter with RVR new onset heart rate has improved with Cardizem infusion.  Cardiology has been consulted.  CHADS2 Vasc score of at least 4.  Will keep patient on heparin infusion for now.  Check TSH 2D echo. CAD status post PCI in 2010.  Denies any chest pain at this time.  Has had some chest pain in the last 2 days.  Will continue with heparin infusion statins beta-blockers aspirin.  Troponins are flat. Diabetes mellitus type 2 patient states he takes Mounjaro.  Last hemoglobin A1c was 5.2 as per the notes in epic. Hypertension presently Cardizem infusion and takes beta-blockers.  Will hold losartan  for now since blood pressures in the low normal with systolic in the 100s. Interstitial lung disease on pirfenidone  and prednisone . OSA on CPAP at bedtime. Chronic kidney disease stage III creatinine at around baseline.  Since patient has a new onset atrial flutter with RVR will need close monitoring further workup and more than 2 midnight stay.   DVT prophylaxis: Heparin infusion. Code Status: Full code. Family Communication: Discussed with patient. Disposition Plan: Progressive care. Consults called: Cardiology. Admission status: Inpatient.

## 2023-09-11 NOTE — Hospital Course (Addendum)
 Same day note  Gary Keller is a 73 y.o. male with history of CAD status post PCI, interstitial lung disease, sleep apnea, hypertension, chronic kidney disease stage III, diabetes mellitus type 2, depression presented hospital with persistent palpitation for 48 hours even at rest with some chest pressure.  In the ED patient was noted to be in atrial flutter with RVR and was started on Cardizem infusion.  Labs showed creatinine elevation at 1.6.  Troponins were 127 followed by 108.  Cardiology was consulted and patient was admitted hospital for further evaluation and treatment.   Patient was admitted to the hospital for atrial flutter with RVR  At the time of my evaluation, patient complains of  Physical examination reveals  Laboratory data and imaging was reviewed  Assessment and Plan.  Atrial flutter with RVR  Heart rate improved with Cardizem drip but blood pressure marginally low.  Heparin drip will be changed to Eliquis.  CHADS2 Vasc score of at least 4.   Check TSH, 2D echo.  At this time plan for DC cardioversion 09/12/2023.  CAD status post PCI in 2010.   Troponins flat continue heparin drip, beta-blockers and aspirin.  Cardiology has been consulted.  Check 2D echocardiogram  Diabetes mellitus type 2 patient states he takes Mounjaro.  Last hemoglobin A1c was 5.2 as per the notes in epic.  Hypertension on beta-blockers and losartan  at home.  Currently on hold.  Continue Cardizem drip with holding parameters.  Interstitial lung disease on pirfenidone  and prednisone .  Follows up with Dr. Villa Greaser pulmonary.  Chronically on 5 mg of prednisone .  Takes 3 L of oxygen at baseline.  OSA on CPAP at bedtime.  Chronic kidney disease stage III creatinine at around baseline.    No Charge  Signed,  Lindwood Rhody, MD [facility]

## 2023-09-11 NOTE — Progress Notes (Signed)
 Plan of Care Note for accepted transfer   Patient: Gary Keller MRN: [id number]   DOA: 09/10/2023  Facility requesting transfer: [facility] Requesting Provider: Rolinda Climes, DO Reason for transfer: New onset atrial flutter with RVR Facility course: Gary Keller is a 73 y.o. male, with ILD, chronic hypoxic respiratory failure on 3 L nasal cannula CAD, essential hypertension, coronary artery disease status post 2 PTA, OSA, who presented to the emergency department for rapid heart rate.  Had some chest discomfort yesterday evening and noticed that his heart rate was elevated.  Chest pain resolved, but has had persistent tachycardia.  He is asymptomatic and states he feels his normal self other than feeling his heart is beating fast.  Denies headache, vision changes, lightheadedness, near-syncope, no current chest pain, baseline shortness of breath, no nausea vomiting, no abdominal pain.  Denies history of atrial fibrillation or atrial flutter.  Upon presentation to the ER, oximetry was 98% on 3 L of O2 via nasal cannula, heart rate was 166 with otherwise normal vital signs.  Labs reveal a BUN of 28 and creatinine 1.68 above previous level.  CBC was within normal.  EKG showed atrial flutter with 2-1 AV conduction with a rate of 165 with left anterior fascicular block.   Portable chest x-ray showed the following: Nonspecific interstitial prominence consistent with atypical infection, edema or interstitial lung disease. No focal consolidation.   The patient was given IV Cardizem bolus followed by drip.  Dr. Leandrew Proctor with cardiology was contacted and is likely planning for cardioversion in a.m.  He can be notified upon the patient's arrival.  Plan of care: The patient is accepted for admission to Progressive unit, at [facility]..  The patient will be under the care and responsibility of the ED physician until arrival to [facility].  Author: Virgene Griffin, MD 09/11/2023  Check  [email or web address] for on-call coverage.  Nursing staff, Please call [facility] Admits & Consults System-Wide number on Amion as soon as patient's arrival, so appropriate admitting provider can evaluate the pt.

## 2023-09-11 NOTE — Progress Notes (Signed)
 Patient arrived to 2C10 via carelink.  VSS, NAD.  Assumed care.  [facility] called and notified of patient arrival to floor.  Patient oriented to room, no questions or concerns voiced at this time.

## 2023-09-11 NOTE — Progress Notes (Signed)
 PHARMACY - ANTICOAGULATION CONSULT NOTE  Pharmacy Consult for heparin Indication: atrial fibrillation  No Known Allergies  Patient Measurements: Height: 5\' 2"  (157.5 cm) Weight: 82.6 kg (182 lb 1.6 oz) IBW/kg (Calculated) : 54.6 HEPARIN DW (KG): 72.6  Vital Signs: Temp: 97.7 F (36.5 C) (04/28 0500) Temp Source: Oral (04/28 0500) BP: 108/63 (04/28 0500) Pulse Rate: 85 (04/28 0500)  Labs: Recent Labs    09/10/23 1936  HGB 14.7  HCT 41.7  PLT 247  CREATININE 1.68*    Estimated Creatinine Clearance: 35.9 mL/min (A) (by C-G formula based on SCr of 1.68 mg/dL (H)).   Medical History: Past Medical History:  Diagnosis Date   CAD (coronary artery disease)    Depression    Diabetes mellitus (HCC)    Borderline   Encounter for screening for malignant neoplasm of prostate    HTN (hypertension)    Hyperlipidemia    Non morbid obesity due to excess calories    Renal insufficiency    Rhinitis, allergic    Assessment: 11 yoM presented to [facility] ED with new onset atrial fibrillation with RVR. Pharmacy consulted to dose heparin.  -CBC stable -No PTA anticoagulation   Goal of Therapy:  Heparin level 0.3-0.7 units/ml Monitor platelets by anticoagulation protocol: Yes   Plan:  Heparin 4000 units IV bolus x1 Start heparin infusion at 1050 units/hr 8h heparin level CBC and heparin level daily  Young Hensen, PharmD. Clinical Pharmacist 09/11/2023 5:38 AM

## 2023-09-11 NOTE — Consult Note (Addendum)
 Cardiology Consultation   As above, patient seen and examined.  Briefly he is a 73 year old male with past medical history of coronary artery disease, pulmonary fibrosis on chronic home oxygen, hypertension, hyperlipidemia, obstructive sleep apnea for evaluation of atrial flutter.  Patient is active when he is on his oxygen and he denies significant dyspnea on exertion, chest pain or syncope.  Approximately 48 hours ago and patient's heart rate was noted to be elevated on home monitoring.  He also had brief palpitations and mild chest pressure unlike his previous pain with coronary artery disease.  Heart rate remained elevated and he went to the emergency room was found to be in atrial flutter.  He was therefore admitted and cardiology asked to evaluate.  Note his heart rate did improve initially with a combination of metoprolol  and Cardizem but his blood pressure decreased into the 80s and IV Cardizem was reduced.  His heart rate is now elevated again.  Creatinine 1.68, hemoglobin 13.5.  Electrocardiogram shows typical atrial flutter with rapid ventricular response and left axis deviation.   1 new onset atrial flutter-patient remains in atrial flutter.  His blood pressure is borderline with continued elevation in heart rate.  Will continue metoprolol  and Cardizem as tolerated.  CHA2DS2-VASc is 3.  Discontinue heparin and begin apixaban 5 mg twice daily.  Will schedule echocardiogram to assess LV function.  Plan to proceed with TEE guided cardioversion tomorrow.  Will then follow-up with electrophysiology as an outpatient for consideration of atrial flutter ablation.  He will be at increased risk for recurrent atrial arrhythmias given baseline pulmonary disease.  2 coronary artery disease-discontinue aspirin given need for anticoagulation.  Continue statin.  He denies chest pain similar to the symptoms he had prior to his PCI.  3 hypertension-hold losartan .  Will treat blood pressure with metoprolol  and  Cardizem.  4 hyperlipidemia-continue statin.  5 pulmonary disease-follow-up pulmonary.  Continue preadmission medications.  6 mildly elevated troponin-no clear trend and not consistent with acute coronary syndrome.  7 obstructive sleep apnea-continue CPAP.  Alexandria Angel, MD  Patient ID: Gary Keller MRN: [id number]; DOB: 1950-11-06  Admit date: 09/10/2023 Date of Consult: 09/11/2023  PCP:  Medicine, [facility]   [facility] Providers Cardiologist:  Alexandria Angel, MD        Patient Profile:   Gary Keller is a 73 y.o. male with a hx of CAD, IPF on 3 L O2 on chronic prednisone  followed by Dr. Villa Greaser, HTN, HLD and OSA who is being seen 09/11/2023 for the evaluation of new atrial flutter at the request of Dr. Efrain Grant.  History of Present Illness:   Mr. Yerby is a pleasant 73 year old male with past medical history of CAD, IPF on 3 L O2 on chronic prednisone  followed by Dr. Villa Greaser, HTN, HLD and OSA.  Patient had PCI DES of posterior lateral and RCA in 2010 and PCI of PDA.  Myoview in 2015 showed EF 56%.  Abdominal ultrasound in December 2017 showed no aneurysm.  CT in February 2022 showed pulmonary fibrosis.  He has been followed by Dr. Villa Greaser of pulmonology service.  He has been taking 5 mg daily of prednisone  for more than a year.  Family noted elevated heart rate in the 160s on pulse oximeter at home Saturday.  Saturday night, he had substernal chest pressure as well.  This is the only episode of chest pressure he had recently.  It feels different from his previous angina.  He does  occasionally feel some palpitation as well.  His heart rate remained elevated on Sunday.  Family ultimately sent him to the hospital when his blood pressure dropped into the 90s.  He has been on 50 mg daily of metoprolol  succinate.  EKG confirmed atrial flutter with RVR.  He was placed on IV Cardizem 15 mg/h, heart rate dropped down to 87 bpm on the high dose Cardizem.   However, this morning, systolic blood pressure dropped into the 80s, IV Cardizem initially held however later restarted at 5 mg/h.  Last dose of his home metoprolol  succinate was last night.  During the interview, his heart rate was persistently elevated in the 120s to 130s.  Initial blood work showed a creatinine of 1.68 which is near his baseline.  Normal hemoglobin.  Normal white blood cell count.  Chest x-ray showed nonspecific interstitial prominence, no focal consolidation.    Past Medical History:  Diagnosis Date   CAD (coronary artery disease)    Depression    Diabetes mellitus (HCC)    Borderline   Encounter for screening for malignant neoplasm of prostate    HTN (hypertension)    Hyperlipidemia    Non morbid obesity due to excess calories    Renal insufficiency    Rhinitis, allergic     Past Surgical History:  Procedure Laterality Date   APPENDECTOMY     COLONOSCOPY WITH ESOPHAGOGASTRODUODENOSCOPY (EGD)     LEFT HEART CATH  2006   Stent x3     Home Medications:  Prior to Admission medications   Medication Sig Start Date End Date Taking? Authorizing Provider  acetaminophen (TYLENOL) 325 MG tablet Take by mouth.    [provider]  albuterol  (VENTOLIN  HFA) 108 (90 Base) MCG/ACT inhaler INHALE 2 PUFFS INTO THE LUNGS EVERY 6 HOURS AS NEEDED FOR WHEEZING OR SHORTNESS OF BREATH 10/28/22   Lind Repine, MD  amLODipine  (NORVASC ) 5 MG tablet TAKE 1 TABLET(5 MG) BY MOUTH DAILY 02/22/21   Lenise Quince, MD  atorvastatin  (LIPITOR) 80 MG tablet TAKE 1 TABLET(80 MG) BY MOUTH DAILY 02/03/23   Lenise Quince, MD  clopidogrel (PLAVIX) 75 MG tablet Take 75 mg by mouth daily.    [provider]  famotidine (PEPCID) 40 MG tablet Take 40 mg by mouth 2 (two) times daily. 04/29/23   [provider]  fenofibrate 160 MG tablet Take 160 mg by mouth daily.  01/11/13   [provider]  losartan  (COZAAR ) 100 MG tablet Take 1 tablet (100 mg total) by mouth  daily. 03/23/21   Lenise Quince, MD  metoprolol  succinate (TOPROL -XL) 50 MG 24 hr tablet Take 50 mg by mouth daily.    [provider]  MOUNJARO 10 MG/0.5ML Pen Inject 10 mg into the skin once a week.    [provider]  omeprazole (PRILOSEC) 40 MG capsule Take 40 mg by mouth daily. 04/29/23   [provider]  Pirfenidone  (ESBRIET ) 267 MG CAPS Take 801 mg by mouth in the morning, at noon, and at bedtime. 08/28/23   Lind Repine, MD  predniSONE  (DELTASONE ) 10 MG tablet Take 10 mg by mouth. Take Monday, Wednesday and Friday 11/29/22   [provider]  predniSONE  (DELTASONE ) 5 MG tablet Take Monday, Wednesday and friday 02/06/23   Lind Repine, MD  VASCEPA  1 g capsule Take 2 capsules (2 g total) by mouth 2 (two) times daily. 08/16/23   Walker, Caitlin S, NP  venlafaxine XR (EFFEXOR-XR) 75 MG 24 hr  capsule Take 75 mg by mouth daily with breakfast.  09/03/09   [provider]    Inpatient Medications: Scheduled Meds:  atorvastatin   80 mg Oral Daily   fenofibrate  160 mg Oral Daily   icosapent  Ethyl  2 g Oral BID   insulin aspart  0-9 Units Subcutaneous TID WC   losartan   100 mg Oral Daily   metoprolol  succinate  50 mg Oral Daily   pantoprazole  40 mg Oral Daily   Pirfenidone   801 mg Oral Daily   [START ON 09/12/2023] predniSONE   5 mg Oral Once per day on Sunday Tuesday Wednesday Thursday Saturday   Followed by   Cecily Cohen ON 09/14/2023] predniSONE   5 mg Oral Once per day on Sunday Tuesday Thursday Saturday   venlafaxine XR  75 mg Oral Q breakfast   Continuous Infusions:  diltiazem (CARDIZEM) infusion 5 mg/hr (09/11/23 0924)   heparin 1,050 Units/hr (09/11/23 0924)   PRN Meds:   Allergies:   No Known Allergies  Social History:   Social History   Socioeconomic History   Marital status: Married    Spouse name: Not on file   Number of children: 3   Years of education: Not on file   Highest education level: Not on file  Occupational History     Comment: Retired  Tobacco Use   Smoking status: Former    Current packs/day: 0.00    Average packs/day: 1 pack/day for 43.0 years (43.0 ttl pk-yrs)    Types: Cigarettes    Start date: 30    Quit date: 05/16/2001    Years since quitting: 22.3   Smokeless tobacco: Never  Vaping Use   Vaping status: Never Used  Substance and Sexual Activity   Alcohol use: Yes    Alcohol/week: 0.0 standard drinks of alcohol    Comment: Occasional   Drug use: No   Sexual activity: Not on file  Other Topics Concern   Not on file  Social History Narrative   [facility] (05/27/16):   Originally from [facility] [place]. Has always lived in [place]. 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 Does have exposure to chemicals from spraying. No mold exposure. Remote cockatiel exposure in a previous home. No hot tub exposure. Enjoys watching his grandchildren and hunting.    Social Drivers of Corporate investment banker Strain: Low Risk  (06/27/2023)   Received from [facility]   Overall Financial Resource Strain (CARDIA)    Difficulty of Paying Living Expenses: Not hard at all  Food Insecurity: No Food Insecurity (09/11/2023)   Hunger Vital Sign    Worried About Running Out of Food in the Last Year: Never true    Ran Out of Food in the Last Year: Never true  Transportation Needs: No Transportation Needs (09/11/2023)   PRAPARE - Administrator, Civil Service (Medical): No    Lack of Transportation (Non-Medical): No  Physical Activity: Unknown (11/29/2022)   Received from [facility]   Exercise Vital Sign    Days of Exercise per Week: 0 days    Minutes of Exercise per Session: Not on file  Stress: No Stress Concern Present (11/29/2022)   Received from [facility] - Occupational Stress Questionnaire    Feeling of Stress : Not at all  Social Connections: Unknown (09/11/2023)   Social Connection and Isolation Panel [NHANES]    Frequency  of Communication with Friends and Family: More  than three times a week    Frequency of Social Gatherings with Friends and Family: More than three times a week    Attends Religious Services: Patient declined    Active Member of Clubs or Organizations: No    Attends Banker Meetings: Patient declined    Marital Status: Married  Catering manager Violence: Not At Risk (09/11/2023)   Humiliation, Afraid, Rape, and Kick questionnaire    Fear of Current or Ex-Partner: No    Emotionally Abused: No    Physically Abused: No    Sexually Abused: No    Family History:    Family History  Problem Relation Age of Onset   Diabetes Brother    Heart disease Brother    Heart disease Father    Hypertension Brother    Stroke Mother    Stroke Brother    Lung disease Neg Hx    Rheumatologic disease Neg Hx      ROS:  Please see the history of present illness.   All other ROS reviewed and negative.     Physical Exam/Data:   Vitals:   09/11/23 0700 09/11/23 0720 09/11/23 0800 09/11/23 0900  BP: (!) 94/56 (!) 89/69 (!) 82/53 94/72  Pulse: 86 85 85 87  Resp: 16 16 (!) 22 17  Temp:  97.8 F (36.6 C)    TempSrc:  Oral  Oral  SpO2: 94% 96% 94% 95%  Weight:      Height:        Intake/Output Summary (Last 24 hours) at 09/11/2023 0953 Last data filed at 09/11/2023 0924 Gross per 24 hour  Intake 2743.34 ml  Output 2100 ml  Net 643.34 ml      09/11/2023    2:45 AM 07/25/2023    3:53 PM 05/01/2023    3:45 PM  Last 3 Weights  Weight (lbs) 182 lb 1.6 oz 178 lb 1.6 oz 184 lb  Weight (kg) 82.6 kg 80.786 kg 83.462 kg     Body mass index is 33.31 kg/m.  General:  Well nourished, well developed, in no acute distress HEENT: normal Neck: no JVD Vascular: No carotid bruits; Distal pulses 2+ bilaterally Cardiac: irregular; no murmur  Lungs: Fine right basilar crackles Abd: soft, nontender, no hepatomegaly  Ext: no edema Musculoskeletal:  No deformities, BUE and BLE strength normal and  equal Skin: warm and dry  Neuro:  CNs 2-12 intact, no focal abnormalities noted Psych:  Normal affect   EKG:  The EKG was personally reviewed and demonstrates: Atrial flutter with RVR Telemetry:  Telemetry was personally reviewed and demonstrates: Atrial flutter with RVR, heart rate initially improved to around 87 bpm, however has since came up to 130s.  Relevant CV Studies:  N/A  Laboratory Data:  High Sensitivity Troponin:  No results for input(s): "TROPONINIHS" in the last 720 hours.   Chemistry Recent Labs  Lab 09/10/23 1936  NA 141  K 4.4  CL 103  CO2 27  GLUCOSE 126*  BUN 28*  CREATININE 1.68*  CALCIUM  9.3  GFRNONAA 42*  ANIONGAP 10    No results for input(s): "PROT", "ALBUMIN", "AST", "ALT", "ALKPHOS", "BILITOT" in the last 168 hours. Lipids No results for input(s): "CHOL", "TRIG", "HDL", "LABVLDL", "LDLCALC", "CHOLHDL" in the last 168 hours.  Hematology Recent Labs  Lab 09/10/23 1936 09/11/23 0826  WBC 9.8 9.6  RBC 4.62 4.27  HGB 14.7 13.5  HCT 41.7 38.8*  MCV 90.3 90.9  MCH 31.8 31.6  MCHC 35.3 34.8  RDW  12.2 12.5  PLT 247 205   Thyroid No results for input(s): "TSH", "FREET4" in the last 168 hours.  BNPNo results for input(s): "BNP", "PROBNP" in the last 168 hours.  DDimer No results for input(s): "DDIMER" in the last 168 hours.   Radiology/Studies:  DG Chest Port 1 View Result Date: 09/10/2023 CLINICAL DATA:  [id number] Chest pain [id number] EXAM: PORTABLE CHEST - 1 VIEW COMPARISON:  08/25/2022. FINDINGS: The heart size and mediastinal contours are within normal limits. There is diffuse pulmonary interstitial prominence which could be seen with atypical infection, edema or interstitial lung disease. There is no focal consolidation. No pneumothorax or pleural effusion. There are thoracic degenerative changes. IMPRESSION: Nonspecific interstitial prominence consistent with atypical infection, edema or interstitial lung disease. No focal consolidation.  Electronically Signed   By: Sydell Eva M.D.   On: 09/10/2023 20:49     Assessment and Plan:   Newly diagnosed atrial flutter with RVR  - Family noted patient's heart rate was elevated 2 days ago, he had intermittent palpitation however does not have great cardiac awareness.  At the current time, his heart rate is in the 120s and that he no longer feel any palpitation.  - Patient is on 15 mg daily of metoprolol  succinate at home, he was started on IV Cardizem and uptitrated to 15 mg/h, however systolic blood pressure dropped down to the 80s and IV Cardizem was held.  Now his blood pressure is up and the heart rate went up to 130s, IV Cardizem has been restarted at 5 mg/h.  - Will convert IV heparin to Eliquis 5 mg twice a day and placed the patient on TEE DCCV scheduled for tomorrow.  Patient is not a great candidate for most of antiarrhythmic therapy given history of CAD and interstitial lung disease (with exception of Tikosyn or multaq in the future if needed).  May consider ablation in the future if have frequent recurrence.  Elevated troponin  - Serial troponin 127-->108.  - Patient complained of a solitary episode of substernal chest pressure on Saturday night, this feels different from the previous angina, this is in the setting of atrial flutter with RVR.  - Most likely demand ischemia, pending echocardiogram.  CAD: Last PCI was in 2010.  IPF on 3 L nasal cannula  - Followed by Dr. Villa Greaser of pulmonology service as outpatient.  On chronic 5 mg daily of prednisone .  Hypertension: Blood pressure soft on rate control medication.  Hyperlipidemia OSA on CPAP   Risk Assessment/Risk Scores:      CHA2DS2-VASc Score = 3   This indicates a 3.2% annual risk of stroke. The patient's score is based upon: CHF History: 0 HTN History: 1 Diabetes History: 0 Stroke History: 0 Vascular Disease History: 1 Age Score: 1 Gender Score: 0         For questions or updates, please contact  [facility] Please consult [email or web address] for contact info under    Mikael Albright, [place]  09/11/2023 9:53 AM

## 2023-09-11 NOTE — Progress Notes (Signed)
 Same day note  Per Notaro Sr Gary Keller is a 73 y.o. male with history of CAD status post PCI, interstitial lung disease, sleep apnea, hypertension, chronic kidney disease stage III, diabetes mellitus type 2, depression presented hospital with persistent palpitation for 48 hours even at rest with some chest pressure.  In the ED patient was noted to be in atrial flutter with RVR and was started on Cardizem infusion.  Labs showed creatinine elevation at 1.6.  Troponins were 127 followed by 108.  Cardiology was consulted and patient was admitted hospital for further evaluation and treatment.   Patient was admitted to the hospital for atrial flutter with RVR  At the time of my evaluation, patient complains of cough but denies any dyspnea or chest pain.  Denies any nausea vomiting fever or chills.  Patient's family at bedside.  Nursing staff reported low BP on Cardizem drip.  Physical examination reveals obese built male, not in obvious distress, irregular rhythm.  Laboratory data and imaging was reviewed  Assessment and Plan.  Atrial flutter with RVR  Heart rate improved with Cardizem drip but blood pressure marginally low.  Heparin drip will be changed to Eliquis.  CHADS2 Vasc score of at least 4.   Check TSH, 2D echo.  At this time plan for DC cardioversion 09/12/2023.  Has been started on metoprolol .  CAD status post PCI in 2010.   Troponins flat continue heparin drip, beta-blockers and aspirin.  Cardiology has been consulted.  Check 2D echocardiogram  Diabetes mellitus type 2 patient states he takes Mounjaro.  Last hemoglobin A1c was 5.2 as per the notes in epic.  Hypertension on beta-blockers and losartan  at home.  Currently on hold.  Continue Cardizem drip with holding parameters.  Interstitial lung disease on pirfenidone  and prednisone .  Follows up with Dr. Villa Greaser pulmonary.  Chronically on 5 mg of prednisone .  Takes 3 L of oxygen at baseline.  OSA on CPAP at bedtime.  Class I obesity.Body  mass index is 33.31 kg/m.  Would benefit from lifestyle modification and ongoing weight loss as outpatient.  Chronic kidney disease stage IIIa creatinine at around baseline.   Spoke with patient's family at bedside.  No Charge  Signed,  Lindwood Rhody, MD [facility]

## 2023-09-11 NOTE — Plan of Care (Signed)
  Problem: Education: Goal: Knowledge of General Education information will improve Description: Including pain rating scale, medication(s)/side effects and non-pharmacologic comfort measures Outcome: Progressing   Problem: Clinical Measurements: Goal: Ability to maintain clinical measurements within normal limits will improve Outcome: Progressing Goal: Will remain free from infection Outcome: Progressing Goal: Diagnostic test results will improve Outcome: Progressing Goal: Respiratory complications will improve Outcome: Progressing   Problem: Activity: Goal: Risk for activity intolerance will decrease Outcome: Progressing   Problem: Nutrition: Goal: Adequate nutrition will be maintained Outcome: Progressing   Problem: Elimination: Goal: Will not experience complications related to bowel motility Outcome: Progressing Goal: Will not experience complications related to urinary retention Outcome: Progressing   Problem: Pain Managment: Goal: General experience of comfort will improve and/or be controlled Outcome: Progressing   Problem: Education: Goal: Knowledge of disease or condition will improve Outcome: Progressing   Problem: Activity: Goal: Ability to tolerate increased activity will improve Outcome: Progressing   Problem: Health Behavior/Discharge Planning: Goal: Ability to safely manage health-related needs after discharge will improve Outcome: Progressing   Problem: Fluid Volume: Goal: Ability to maintain a balanced intake and output will improve Outcome: Progressing   Problem: Metabolic: Goal: Ability to maintain appropriate glucose levels will improve Outcome: Progressing

## 2023-09-11 NOTE — Discharge Instructions (Signed)

## 2023-09-12 ENCOUNTER — Encounter (HOSPITAL_COMMUNITY): Payer: Self-pay | Admitting: Internal Medicine

## 2023-09-12 ENCOUNTER — Inpatient Hospital Stay (HOSPITAL_COMMUNITY): Admitting: Anesthesiology

## 2023-09-12 ENCOUNTER — Inpatient Hospital Stay (HOSPITAL_COMMUNITY)

## 2023-09-12 ENCOUNTER — Encounter (HOSPITAL_COMMUNITY)
Admission: EM | Disposition: A | Discharge status: Home / Self Care | Payer: Self-pay | Source: Home / Self Care | Attending: Family Medicine

## 2023-09-12 DIAGNOSIS — I10 Essential (primary) hypertension: Secondary | ICD-10-CM

## 2023-09-12 DIAGNOSIS — I2510 Atherosclerotic heart disease of native coronary artery without angina pectoris: Secondary | ICD-10-CM

## 2023-09-12 DIAGNOSIS — I4891 Unspecified atrial fibrillation: Secondary | ICD-10-CM

## 2023-09-12 DIAGNOSIS — I4892 Unspecified atrial flutter: Secondary | ICD-10-CM | POA: Diagnosis not present

## 2023-09-12 DIAGNOSIS — I959 Hypotension, unspecified: Secondary | ICD-10-CM

## 2023-09-12 DIAGNOSIS — G4733 Obstructive sleep apnea (adult) (pediatric): Secondary | ICD-10-CM

## 2023-09-12 HISTORY — PX: TRANSESOPHAGEAL ECHOCARDIOGRAM (CATH LAB): EP1270

## 2023-09-12 HISTORY — PX: CARDIOVERSION: EP1203

## 2023-09-12 LAB — CBC
HCT: 39 % (ref 39.0–52.0)
Hemoglobin: 13.5 g/dL (ref 13.0–17.0)
MCH: 31.5 pg (ref 26.0–34.0)
MCHC: 34.6 g/dL (ref 30.0–36.0)
MCV: 90.9 fL (ref 80.0–100.0)
Platelets: 218 10*3/uL (ref 150–400)
RBC: 4.29 MIL/uL (ref 4.22–5.81)
RDW: 12.4 % (ref 11.5–15.5)
WBC: 8.7 10*3/uL (ref 4.0–10.5)
nRBC: 0 % (ref 0.0–0.2)

## 2023-09-12 LAB — GLUCOSE, CAPILLARY
Glucose-Capillary: 93 mg/dL (ref 70–99)
Glucose-Capillary: 116 mg/dL — ABNORMAL HIGH (ref 70–99)
Glucose-Capillary: 125 mg/dL — ABNORMAL HIGH (ref 70–99)
Glucose-Capillary: 93 mg/dL (ref 70–99)

## 2023-09-12 LAB — BASIC METABOLIC PANEL WITH GFR
Anion gap: 10 (ref 5–15)
BUN: 20 mg/dL (ref 8–23)
CO2: 24 mmol/L (ref 22–32)
Calcium: 8.6 mg/dL — ABNORMAL LOW (ref 8.9–10.3)
Chloride: 105 mmol/L (ref 98–111)
Creatinine, Ser: 1.38 mg/dL — ABNORMAL HIGH (ref 0.61–1.24)
GFR, Estimated: 54 mL/min — ABNORMAL LOW (ref >60)
Glucose, Bld: 109 mg/dL — ABNORMAL HIGH (ref 70–99)
Potassium: 3.7 mmol/L (ref 3.5–5.1)
Sodium: 139 mmol/L (ref 135–145)

## 2023-09-12 LAB — MRSA NEXT GEN BY PCR, NASAL: MRSA by PCR Next Gen: NOT DETECTED

## 2023-09-12 LAB — ECHO TEE

## 2023-09-12 LAB — MAGNESIUM: Magnesium: 1.8 mg/dL (ref 1.7–2.4)

## 2023-09-12 SURGERY — TRANSESOPHAGEAL ECHOCARDIOGRAM (TEE) (CATHLAB)
Anesthesia: Monitor Anesthesia Care

## 2023-09-12 MED ORDER — SODIUM CHLORIDE 0.9% FLUSH: 3.0000 mL | Freq: Two times a day (BID) | INTRAVENOUS | Status: DC

## 2023-09-12 MED ORDER — SODIUM CHLORIDE 0.9 % IV BOLUS
500.0000 mL | Freq: Once | INTRAVENOUS | Status: AC
  Administered 2023-09-12: 500 mL via INTRAVENOUS

## 2023-09-12 MED ORDER — POTASSIUM CHLORIDE CRYS ER 20 MEQ PO TBCR
20.0000 meq | EXTENDED_RELEASE_TABLET | Freq: Once | ORAL | Status: AC
  Administered 2023-09-12: 20 meq via ORAL
  Filled 2023-09-12: qty 1

## 2023-09-12 MED ORDER — PHENYLEPHRINE HCL-NACL 20-0.9 MG/250ML-% IV SOLN: 0.0000 ug/min | INTRAVENOUS | Status: DC

## 2023-09-12 MED ORDER — PHENYLEPHRINE HCL-NACL 20-0.9 MG/250ML-% IV SOLN: 25.0000 ug/min | INTRAVENOUS | Status: DC

## 2023-09-12 MED ORDER — SODIUM CHLORIDE 0.9% FLUSH: 3.0000 mL | INTRAVENOUS | Status: DC | PRN

## 2023-09-12 MED ORDER — MAGNESIUM SULFATE 2 GM/50ML IV SOLN
2.0000 g | Freq: Once | INTRAVENOUS | Status: AC
  Administered 2023-09-12: 2 g via INTRAVENOUS
  Filled 2023-09-12: qty 50

## 2023-09-12 MED ORDER — SODIUM CHLORIDE 0.9 % IV SOLN
250.0000 mL | INTRAVENOUS | Status: DC
  Administered 2023-09-12: 250 mL via INTRAVENOUS

## 2023-09-12 MED ORDER — LACTATED RINGERS IV BOLUS
1000.0000 mL | Freq: Once | INTRAVENOUS | Status: AC
Start: 2023-09-12 — End: 2023-09-12

## 2023-09-12 MED ORDER — PHENYLEPHRINE HCL-NACL 20-0.9 MG/250ML-% IV SOLN
INTRAVENOUS | Status: DC | PRN
  Administered 2023-09-12: 50 ug/min via INTRAVENOUS

## 2023-09-12 MED ORDER — CHLORHEXIDINE GLUCONATE CLOTH 2 % EX PADS
6.0000 | MEDICATED_PAD | Freq: Every day | CUTANEOUS | Status: DC
Start: 2023-09-12 — End: 2023-09-13
  Administered 2023-09-12 – 2023-09-13 (×2): 6 via TOPICAL

## 2023-09-12 MED ORDER — PHENYLEPHRINE 80 MCG/ML (10ML) SYRINGE FOR IV PUSH (FOR BLOOD PRESSURE SUPPORT)
PREFILLED_SYRINGE | INTRAVENOUS | Status: DC | PRN
  Administered 2023-09-12: 80 ug via INTRAVENOUS
  Administered 2023-09-12: 160 ug via INTRAVENOUS
  Administered 2023-09-12: 240 ug via INTRAVENOUS
  Administered 2023-09-12: 80 ug via INTRAVENOUS
  Administered 2023-09-12 (×3): 160 ug via INTRAVENOUS
  Administered 2023-09-12 (×2): 80 ug via INTRAVENOUS
  Administered 2023-09-12: 160 ug via INTRAVENOUS

## 2023-09-12 MED ORDER — MIDODRINE HCL 5 MG PO TABS
10.0000 mg | ORAL_TABLET | Freq: Once | ORAL | Status: AC
  Administered 2023-09-12: 10 mg via ORAL
  Filled 2023-09-12: qty 2

## 2023-09-12 MED ORDER — EPHEDRINE SULFATE (PRESSORS) 50 MG/ML IJ SOLN
INTRAMUSCULAR | Status: DC | PRN
  Administered 2023-09-12 (×5): 5 mg via INTRAVENOUS

## 2023-09-12 MED ORDER — PROPOFOL 10 MG/ML IV BOLUS
INTRAVENOUS | Status: DC | PRN
  Administered 2023-09-12: 20 mg via INTRAVENOUS
  Administered 2023-09-12: 50 mg via INTRAVENOUS
  Administered 2023-09-12 (×4): 20 mg via INTRAVENOUS

## 2023-09-12 MED ORDER — ALBUMIN HUMAN 5 % IV SOLN: INTRAVENOUS | Status: DC | PRN

## 2023-09-12 SURGICAL SUPPLY — 1 items: PAD DEFIB RADIO PHYSIO CONN (PAD) ×1 IMPLANT

## 2023-09-12 NOTE — CV Procedure (Signed)
 Brief TEE Note:  LVEF 65% RV systolic function mildly reduced No significant valvular abnormalities No LAA/LA thrombus or masses.  For additional details see full report.    Electrical Cardioversion Procedure Note Hence Kosten [id number] 1950-06-14  Procedure: Electrical Cardioversion Indications:  Atrial Fibrillation  Procedure Details Consent: Risks of procedure as well as the alternatives and risks of each were explained to the (patient/caregiver).  Consent for procedure obtained. Time Out: Verified patient identification, verified procedure, site/side was marked, verified correct patient position, special equipment/implants available, medications/allergies/relevent history reviewed, required imaging and test results available.  Performed  Patient placed on cardiac monitor, pulse oximetry, supplemental oxygen as necessary.  Sedation given:  propofol Pacer pads placed anterior and posterior chest.  Cardioverted 1 time(s).  Cardioverted at 200J.  Evaluation Findings: Post procedure EKG shows: NSR Complications: None Patient did tolerate procedure well.   Maudine Sos, MD 09/12/2023, 10:04 AM

## 2023-09-12 NOTE — Plan of Care (Signed)
  Problem: Education: Goal: Knowledge of General Education information will improve Description: Including pain rating scale, medication(s)/side effects and non-pharmacologic comfort measures Outcome: Progressing   Problem: Health Behavior/Discharge Planning: Goal: Ability to manage health-related needs will improve Outcome: Progressing   Problem: Clinical Measurements: Goal: Ability to maintain clinical measurements within normal limits will improve Outcome: Progressing Goal: Will remain free from infection Outcome: Progressing Goal: Diagnostic test results will improve Outcome: Progressing Goal: Respiratory complications will improve Outcome: Progressing Goal: Cardiovascular complication will be avoided Outcome: Progressing   Problem: Activity: Goal: Risk for activity intolerance will decrease Outcome: Progressing   Problem: Nutrition: Goal: Adequate nutrition will be maintained Outcome: Progressing   Problem: Coping: Goal: Level of anxiety will decrease Outcome: Progressing   Problem: Elimination: Goal: Will not experience complications related to bowel motility Outcome: Progressing Goal: Will not experience complications related to urinary retention Outcome: Progressing   Problem: Pain Managment: Goal: General experience of comfort will improve and/or be controlled Outcome: Progressing   Problem: Safety: Goal: Ability to remain free from injury will improve Outcome: Progressing   Problem: Skin Integrity: Goal: Risk for impaired skin integrity will decrease Outcome: Progressing   Problem: Education: Goal: Knowledge of disease or condition will improve Outcome: Progressing Goal: Understanding of medication regimen will improve Outcome: Progressing Goal: Individualized Educational Video(s) Outcome: Progressing   Problem: Activity: Goal: Ability to tolerate increased activity will improve Outcome: Progressing   Problem: Cardiac: Goal: Ability to achieve  and maintain adequate cardiopulmonary perfusion will improve Outcome: Progressing   Problem: Health Behavior/Discharge Planning: Goal: Ability to safely manage health-related needs after discharge will improve Outcome: Progressing   Problem: Education: Goal: Ability to describe self-care measures that may prevent or decrease complications (Diabetes Survival Skills Education) will improve Outcome: Progressing Goal: Individualized Educational Video(s) Outcome: Progressing   Problem: Coping: Goal: Ability to adjust to condition or change in health will improve Outcome: Progressing   Problem: Fluid Volume: Goal: Ability to maintain a balanced intake and output will improve Outcome: Progressing   Problem: Health Behavior/Discharge Planning: Goal: Ability to identify and utilize available resources and services will improve Outcome: Progressing Goal: Ability to manage health-related needs will improve Outcome: Progressing   Problem: Metabolic: Goal: Ability to maintain appropriate glucose levels will improve Outcome: Progressing   Problem: Nutritional: Goal: Maintenance of adequate nutrition will improve Outcome: Progressing Goal: Progress toward achieving an optimal weight will improve Outcome: Progressing   Problem: Skin Integrity: Goal: Risk for impaired skin integrity will decrease Outcome: Progressing   Problem: Tissue Perfusion: Goal: Adequacy of tissue perfusion will improve Outcome: Progressing

## 2023-09-12 NOTE — H&P (View-Only) (Signed)
 Rounding Note    Patient Name: Gary Keller Date of Encounter: 09/12/2023  [facility] Cardiologist: Alexandria Angel, MD   Subjective   No CP or dyspnea  Inpatient Medications    Scheduled Meds:  apixaban  5 mg Oral BID   atorvastatin   80 mg Oral Daily   fenofibrate  160 mg Oral Daily   icosapent  Ethyl  2 g Oral BID   insulin aspart  0-9 Units Subcutaneous TID WC   metoprolol  succinate  50 mg Oral Daily   pantoprazole  40 mg Oral Daily   Pirfenidone   801 mg Oral TID   predniSONE   5 mg Oral Once per day on Sunday Tuesday Thursday Saturday   sodium chloride flush  3-10 mL Intravenous Q12H   venlafaxine XR  75 mg Oral Q breakfast   Continuous Infusions:  diltiazem (CARDIZEM) infusion 10 mg/hr (09/12/23 0449)   PRN Meds: sodium chloride flush   Vital Signs    Vitals:   09/12/23 0029 09/12/23 0331 09/12/23 0431 09/12/23 0652  BP: 90/62 (!) 81/71 (!) 110/90 104/85  Pulse: 95 (!) 170 (!) 166 (!) 166  Resp: 20 20 19 20   Temp:  97.6 F (36.4 C)  97.7 F (36.5 C)  TempSrc:  Axillary  Oral  SpO2: 94% 95% 96% 93%  Weight:      Height:        Intake/Output Summary (Last 24 hours) at 09/12/2023 0803 Last data filed at 09/12/2023 4098 Gross per 24 hour  Intake 527.1 ml  Output 1000 ml  Net -472.9 ml      09/11/2023    2:45 AM 07/25/2023    3:53 PM 05/01/2023    3:45 PM  Last 3 Weights  Weight (lbs) 182 lb 1.6 oz 178 lb 1.6 oz 184 lb  Weight (kg) 82.6 kg 80.786 kg 83.462 kg      Telemetry    Atrial flutter with rapid ventricular response - Personally Reviewed  Physical Exam   GEN: No acute distress.   Neck: No JVD Cardiac: Regular and tachycardic Respiratory: Clear to auscultation bilaterally. GI: Soft, nontender, non-distended  MS: No edema Neuro:  Nonfocal  Psych: Normal affect   Labs     Chemistry Recent Labs  Lab 09/10/23 1936 09/11/23 0826 09/12/23 0241  NA 141 139 139  K 4.4 3.7 3.7  CL 103 110 105  CO2 27 23 24    GLUCOSE 126* 94 109*  BUN 28* 20 20  CREATININE 1.68* 1.22 1.38*  CALCIUM  9.3 8.4* 8.6*  MG  --   --  1.8  PROT  --  5.8*  --   ALBUMIN  --  3.2*  --   AST  --  20  --   ALT  --  16  --   ALKPHOS  --  71  --   BILITOT  --  0.7  --   GFRNONAA 42* >60 54*  ANIONGAP 10 6 10      Hematology Recent Labs  Lab 09/10/23 1936 09/11/23 0826 09/12/23 0241  WBC 9.8 9.6 8.7  RBC 4.62 4.27 4.29  HGB 14.7 13.5 13.5  HCT 41.7 38.8* 39.0  MCV 90.3 90.9 90.9  MCH 31.8 31.6 31.5  MCHC 35.3 34.8 34.6  RDW 12.2 12.5 12.4  PLT 247 205 218   Thyroid  Recent Labs  Lab 09/11/23 0826  TSH 3.253     Radiology    ECHOCARDIOGRAM COMPLETE Result Date: 09/11/2023    ECHOCARDIOGRAM REPORT  Patient Name:   Gary Keller Date of Exam: 09/11/2023 Medical Rec #:  [id number]           Height:       62.0 in Accession #:    [id number]          Weight:       182.1 lb Date of Birth:  1950-06-13            BSA:          1.837 m Patient Age:    73 years            BP:           82/53 mmHg Patient Gender: M                   HR:           95 bpm. Exam Location:  Inpatient Procedure: 2D Echo, Cardiac Doppler and Color Doppler (Both Spectral and Color            Flow Doppler were utilized during procedure). Indications:    A-fib with RVR  History:        Patient has no prior history of Echocardiogram examinations.                 CKD, Arrythmias:Atrial Fibrillation; Risk Factors:Former Smoker,                 Sleep Apnea, Hypertension, Dyslipidemia and Diabetes.  Sonographer:    Adelia Homestead RVT RCS Referring Phys: [id number] ARSHAD N [facility] IMPRESSIONS  1. Left ventricular ejection fraction, by estimation, is 60 to 65%. The left ventricle has normal function. The left ventricle has no regional wall motion abnormalities. Left ventricular diastolic function could not be evaluated due to atrial flutter.  2. Right ventricular systolic function is mildly reduced. The right ventricular size is normal. Visually right  ventricular wall thickness is above normal limits. There is normal pulmonary artery systolic pressure. The estimated right ventricular systolic pressure is 32.4 mmHg.  3. The mitral valve is normal in structure. Mild to moderate mitral valve regurgitation. No evidence of mitral stenosis.  4. The aortic valve is tricuspid. Aortic valve regurgitation is not visualized. Aortic valve sclerosis is present, with no evidence of aortic valve stenosis. Comparison(s): No prior Echocardiogram. FINDINGS  Left Ventricle: Left ventricular ejection fraction, by estimation, is 60 to 65%. The left ventricle has normal function. The left ventricle has no regional wall motion abnormalities. The left ventricular internal cavity size was normal in size. There is  borderline left ventricular hypertrophy. Left ventricular diastolic function could not be evaluated due to atrial flutter. Right Ventricle: The right ventricular size is normal. Visually right ventricular wall thickness is above normal limits. Right ventricular systolic function is mildly reduced. There is normal pulmonary artery systolic pressure. The tricuspid regurgitant velocity is 2.71 m/s, and with an assumed right atrial pressure of 3 mmHg, the estimated right ventricular systolic pressure is 32.4 mmHg. Left Atrium: Left atrial size was normal in size. Right Atrium: Right atrial size was normal in size. Pericardium: There is no evidence of pericardial effusion. Mitral Valve: The mitral valve is normal in structure. Mild to moderate mitral valve regurgitation. No evidence of mitral valve stenosis. Tricuspid Valve: The tricuspid valve is normal in structure. Tricuspid valve regurgitation is mild . No evidence of tricuspid stenosis. Aortic Valve: The aortic valve is tricuspid. Aortic valve regurgitation is not visualized. Aortic valve sclerosis is present, with  no evidence of aortic valve stenosis. Aortic valve mean gradient measures 2.0 mmHg. Aortic valve peak gradient  measures 3.8  mmHg. Aortic valve area, by VTI measures 1.85 cm. Pulmonic Valve: The pulmonic valve was normal in structure. Pulmonic valve regurgitation is mild. No evidence of pulmonic stenosis. Aorta: The aortic root and ascending aorta are structurally normal, with no evidence of dilitation. IAS/Shunts: The atrial septum is grossly normal.  LEFT VENTRICLE PLAX 2D LVIDd:         4.00 cm   Diastology LVIDs:         2.50 cm   LV e' medial:    10.20 cm/s LV PW:         1.20 cm   LV E/e' medial:  12.2 LV IVS:        1.10 cm   LV e' lateral:   12.20 cm/s LVOT diam:     1.60 cm   LV E/e' lateral: 10.2 LV SV:         30 LV SV Index:   16 LVOT Area:     2.01 cm  RIGHT VENTRICLE            IVC RV Basal diam:  3.00 cm    IVC diam: 1.50 cm RV Mid diam:    2.70 cm RV S prime:     8.19 cm/s LEFT ATRIUM             Index        RIGHT ATRIUM           Index LA diam:        4.10 cm 2.23 cm/m   RA Area:     12.90 cm LA Vol (A2C):   41.7 ml 22.70 ml/m  RA Volume:   28.80 ml  15.68 ml/m LA Vol (A4C):   30.8 ml 16.77 ml/m LA Biplane Vol: 38.0 ml 20.69 ml/m  AORTIC VALVE                    PULMONIC VALVE AV Area (Vmax):    1.85 cm     PV Vmax:       0.63 m/s AV Area (Vmean):   1.75 cm     PV Peak grad:  1.6 mmHg AV Area (VTI):     1.85 cm AV Vmax:           97.70 cm/s AV Vmean:          65.600 cm/s AV VTI:            0.161 m AV Peak Grad:      3.8 mmHg AV Mean Grad:      2.0 mmHg LVOT Vmax:         90.10 cm/s LVOT Vmean:        57.000 cm/s LVOT VTI:          0.148 m LVOT/AV VTI ratio: 0.92  AORTA Ao Root diam: 3.20 cm Ao Asc diam:  3.20 cm MITRAL VALVE                TRICUSPID VALVE MV Area (PHT): 5.56 cm     TR Peak grad:   29.4 mmHg MV Decel Time: 137 msec     TR Vmax:        271.00 cm/s MV E velocity: 124.00 cm/s MV A velocity: 40.80 cm/s   SHUNTS MV E/A ratio:  3.04         Systemic VTI:  0.15 m  Systemic Diam: 1.60 cm Sunit Tolia Electronically signed by Olinda Bertrand Signature Date/Time:  09/11/2023/4:14:22 PM    Final    DG Chest Port 1 View Result Date: 09/10/2023 CLINICAL DATA:  [id number] Chest pain [id number] EXAM: PORTABLE CHEST - 1 VIEW COMPARISON:  08/25/2022. FINDINGS: The heart size and mediastinal contours are within normal limits. There is diffuse pulmonary interstitial prominence which could be seen with atypical infection, edema or interstitial lung disease. There is no focal consolidation. No pneumothorax or pleural effusion. There are thoracic degenerative changes. IMPRESSION: Nonspecific interstitial prominence consistent with atypical infection, edema or interstitial lung disease. No focal consolidation. Electronically Signed   By: Sydell Eva M.D.   On: 09/10/2023 20:49     Patient Profile     73 y.o. male with past medical history of coronary artery disease, pulmonary fibrosis on chronic home oxygen, hypertension, hyperlipidemia, obstructive sleep apnea with atrial flutter.  Echocardiogram this admission shows ejection fraction 60 to 65%, mild RV dysfunction, mild to moderate mitral regurgitation.  Assessment & Plan    1 new onset atrial flutter-patient remains in atrial flutter this morning.  His heart rate is elevated and blood pressure is borderline.  Continue Cardizem and metoprolol  as tolerated by blood pressure.  He will receive his third dose of Eliquis this morning.  Proceed with TEE guided cardioversion today.  Plan outpatient follow-up with electrophysiology for consideration of atrial flutter ablation following discharge.  I think he will be at increased risk for recurrent atrial arrhythmias given baseline pulmonary disease.     2 coronary artery disease-Continue statin.  He denies chest pain similar to the symptoms he had prior to his PCI.   3 hypertension-losartan  is on hold.  Continue Cardizem and metoprolol  and adjust regimen based on follow-up blood pressure readings.   4 hyperlipidemia-continue statin.   5 pulmonary disease-follow-up pulmonary.   Continue preadmission medications.   6 mildly elevated troponin-no clear trend and not consistent with acute coronary syndrome.   7 obstructive sleep apnea-continue CPAP.  For questions or updates, please contact [facility] Please consult [email or web address] for contact info under        Signed, Alexandria Angel, MD  09/12/2023, 8:03 AM

## 2023-09-12 NOTE — CV Procedure (Signed)
 Procedure addendum:   Mr. Weiher was successfully cardioverted.  However, after the procedure he was persistently hypotensive in the 70s systolic.  He was asymptomatic.  He was given IV fluid, albumin, and boluses of neo synephrine without improvement.  He was then given midodrine 10mg  and started on IV neo.  Given that he cannot return to [facility] on an infusion of pressors, he will be transferred to the ICU.  Kassity Woodson C. Theodis Fiscal, MD, [facility] 09/12/2023 11:14 AM

## 2023-09-12 NOTE — Anesthesia Preprocedure Evaluation (Signed)
 Anesthesia Evaluation  Patient identified by MRN, date of birth, ID band Patient awake    Reviewed: Allergy & Precautions, NPO status , Patient's Chart, lab work & pertinent test results  History of Anesthesia Complications Negative for: history of anesthetic complications  Airway Mallampati: IV       Dental  (+) Dental Advisory Given, Missing,    Pulmonary sleep apnea and Continuous Positive Airway Pressure Ventilation , neg COPD, neg recent URI, former smoker   breath sounds clear to auscultation       Cardiovascular hypertension, Pt. on medications and Pt. on home beta blockers + CAD and + Cardiac Stents  + dysrhythmias Atrial Fibrillation  Rhythm:Regular Rate:Tachycardia  1. Left ventricular ejection fraction, by estimation, is 60 to 65%. The  left ventricle has normal function. The left ventricle has no regional  wall motion abnormalities. Left ventricular diastolic function could not  be evaluated due to atrial flutter.   2. Right ventricular systolic function is mildly reduced. The right  ventricular size is normal. Visually right ventricular wall thickness is  above normal limits. There is normal pulmonary artery systolic pressure.  The estimated right ventricular  systolic pressure is 32.4 mmHg.   3. The mitral valve is normal in structure. Mild to moderate mitral valve  regurgitation. No evidence of mitral stenosis.   4. The aortic valve is tricuspid. Aortic valve regurgitation is not  visualized. Aortic valve sclerosis is present, with no evidence of aortic  valve stenosis.     Neuro/Psych  PSYCHIATRIC DISORDERS  Depression    negative neurological ROS     GI/Hepatic Neg liver ROS,GERD  ,,  Endo/Other  diabetes    Renal/GU Renal diseaseLab Results      Component                Value               Date                      NA                       139                 09/12/2023                K                         3.7                 09/12/2023                CO2                      24                  09/12/2023                GLUCOSE                  109 (H)             09/12/2023                BUN                      20  09/12/2023                CREATININE               1.38 (H)            09/12/2023                CALCIUM                   8.6 (L)             09/12/2023                GFR                      47.21 (L)           03/18/2020                EGFR                     37 (L)              11/07/2022                GFRNONAA                 54 (L)              09/12/2023                Musculoskeletal negative musculoskeletal ROS (+)    Abdominal   Peds  Hematology Lab Results      Component                Value               Date                      WBC                      8.7                 09/12/2023                HGB                      13.5                09/12/2023                HCT                      39.0                09/12/2023                MCV                      90.9                09/12/2023                PLT                      218                 09/12/2023  Anesthesia Other Findings   Reproductive/Obstetrics                              Anesthesia Physical Anesthesia Plan  ASA: 3  Anesthesia Plan: MAC and General   Post-op Pain Management: Minimal or no pain anticipated   Induction: Intravenous  PONV Risk Score and Plan: 2 and Propofol infusion, TIVA and Treatment may vary due to age or medical condition  Airway Management Planned: Nasal Cannula, Natural Airway and Simple Face Mask  Additional Equipment: None  Intra-op Plan:   Post-operative Plan:   Informed Consent: I have reviewed the patients History and Physical, chart, labs and discussed the procedure including the risks, benefits and alternatives for the proposed anesthesia with the patient or authorized  representative who has indicated his/her understanding and acceptance.     Dental advisory given  Plan Discussed with: CRNA  Anesthesia Plan Comments:          Anesthesia Quick Evaluation

## 2023-09-12 NOTE — Hospital Course (Signed)
 Procedure addendum:   Mr. Weiher was successfully cardioverted.  However, after the procedure he was persistently hypotensive in the 70s systolic.  He was asymptomatic.  He was given IV fluid, albumin, and boluses of neo synephrine without improvement.  He was then given midodrine 10mg  and started on IV neo.  Given that he cannot return to [facility] on an infusion of pressors, he will be transferred to the ICU.  Kassity Woodson C. Theodis Fiscal, MD, [facility] 09/12/2023 11:14 AM

## 2023-09-12 NOTE — Consult Note (Signed)
 Advanced Heart Failure Team Consult Note   Primary Physician: Medicine, [facility] Cardiologist:  Alexandria Angel, MD  Reason for Consultation: Hypotension (persistent hypotension post TEE/DCCV requiring vasopressor support)  HPI:    Gary Keller is seen today after being transferred to CCU for persistent hypotension post TEE/DCCV requiring vasopressor support. Seen at the request of Dr. Theodis [facility].   73 y/o male w/ h/o CAD, pulmonary fibrosis followed by Dr. Villa Greaser, chronic hypoxic respiratory failure on home O2 (2L/min baseline), OSA on CPAP, HTN and HLD, who was admitted for new onset atrial flutter w/ RVR. EKG showed typical aflutter. TSH nl. Hs trop 127>>108. Echo showed normal EF 60-65%, RV mildly reduced, mild-mod MR. Failed attempts at rate control w/ IV Cardizem and metoprolol . C/w AFL w/ RVR, c/b soft BP. Required discontinuation of losartan  and metoprolol  to allow room for AV nodal blockers.   Earlier today, he underwent TEE/DCCV w/ prompt conversion to NSR. Post procedure, he was persistently hypotensive w/ SBPs in the 70s, though asymptomatic.  He was given IV fluid, albumin, and boluses of neo synephrine without improvement. He was then given midodrine 10mg  and started on IV neo. Transferred to CCU for further management.   He is now off NEO. BP 93/59 (69). Maintaining NSR. HR 90s. A&Ox3. No complaints. Denies fatigue. No dizziness. Denies dyspnea. No CP. Reports full nightly compliance w/ CPAP.    Home Medications Prior to Admission medications   Medication Sig Start Date End Date Taking? Authorizing Provider  amLODipine  (NORVASC ) 5 MG tablet TAKE 1 TABLET(5 MG) BY MOUTH DAILY 02/22/21  Yes Lenise Quince, MD  atorvastatin  (LIPITOR) 80 MG tablet TAKE 1 TABLET(80 MG) BY MOUTH DAILY 02/03/23  Yes Lenise Quince, MD  clopidogrel (PLAVIX) 75 MG tablet Take 75 mg by mouth daily.   Yes [provider]  fenofibrate 160 MG tablet Take  160 mg by mouth daily.  01/11/13  Yes [provider]  losartan  (COZAAR ) 100 MG tablet Take 1 tablet (100 mg total) by mouth daily. 03/23/21  Yes Lenise Quince, MD  metoprolol  succinate (TOPROL -XL) 50 MG 24 hr tablet Take 50 mg by mouth daily.   Yes [provider]  MOUNJARO 10 MG/0.5ML Pen Inject 10 mg into the skin once a week.   Yes [provider]  omeprazole (PRILOSEC) 40 MG capsule Take 40 mg by mouth daily. 04/29/23  Yes [provider]  Pirfenidone  (ESBRIET ) 267 MG CAPS Take 801 mg by mouth in the morning, at noon, and at bedtime. 08/28/23  Yes Lind Repine, MD  predniSONE  (DELTASONE ) 5 MG tablet Take Monday, Wednesday and friday Patient taking differently: Takes TTSS - skip MWF 02/06/23  Yes Lind Repine, MD  VASCEPA  1 g capsule Take 2 capsules (2 g total) by mouth 2 (two) times daily. 08/16/23  Yes Clearnce Curia, NP  venlafaxine XR (EFFEXOR-XR) 75 MG 24 hr capsule Take 75 mg by mouth daily with breakfast.  09/03/09  Yes [provider]  acetaminophen (TYLENOL) 325 MG tablet Take by mouth. Patient not taking: Reported on 09/11/2023    [provider]  albuterol  (VENTOLIN  HFA) 108 (90 Base) MCG/ACT inhaler INHALE 2 PUFFS INTO THE LUNGS EVERY 6 HOURS AS NEEDED FOR WHEEZING OR SHORTNESS OF BREATH Patient not taking: Reported on 09/11/2023 10/28/22   Lind Repine, MD  famotidine (PEPCID) 40 MG tablet Take 40 mg by mouth 2 (two) times daily. Patient not taking: Reported on  09/11/2023 04/29/23   [provider]    Past Medical History: Past Medical History:  Diagnosis Date   CAD (coronary artery disease)    Depression    Diabetes mellitus (HCC)    Borderline   Encounter for screening for malignant neoplasm of prostate    HTN (hypertension)    Hyperlipidemia    Non morbid obesity due to excess calories    Renal insufficiency    Rhinitis, allergic     Past Surgical History: Past Surgical History:  Procedure  Laterality Date   APPENDECTOMY     COLONOSCOPY WITH ESOPHAGOGASTRODUODENOSCOPY (EGD)     LEFT HEART CATH  2006   Stent x3    Family History: Family History  Problem Relation Age of Onset   Diabetes Brother    Heart disease Brother    Heart disease Father    Hypertension Brother    Stroke Mother    Stroke Brother    Lung disease Neg Hx    Rheumatologic disease Neg Hx     Social History: Social History   Socioeconomic History   Marital status: Married    Spouse name: Not on file   Number of children: 3   Years of education: Not on file   Highest education level: Not on file  Occupational History    Comment: Retired  Tobacco Use   Smoking status: Former    Current packs/day: 0.00    Average packs/day: 1 pack/day for 43.0 years (43.0 ttl pk-yrs)    Types: Cigarettes    Start date: 73    Quit date: 05/16/2001    Years since quitting: 22.3   Smokeless tobacco: Never  Vaping Use   Vaping status: Never Used  Substance and Sexual Activity   Alcohol use: Yes    Alcohol/week: 0.0 standard drinks of alcohol    Comment: Occasional   Drug use: No   Sexual activity: Not on file  Other Topics Concern   Not on file  Social History Narrative   [facility] (05/27/16):   Originally from [facility]. Has always lived in [place]. 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 Does have exposure to chemicals from spraying. No mold exposure. Remote cockatiel exposure in a previous home. No hot tub exposure. Enjoys watching his grandchildren and hunting.    Social Drivers of Corporate investment banker Strain: Low Risk  (06/27/2023)   Received from [facility]   Overall Financial Resource Strain (CARDIA)    Difficulty of Paying Living Expenses: Not hard at all  Food Insecurity: No Food Insecurity (09/11/2023)   Hunger Vital Sign    Worried About Running Out of Food in the Last Year: Never true    Ran Out of Food in the Last Year: Never true  Transportation Needs:  No Transportation Needs (09/11/2023)   PRAPARE - Administrator, Civil Service (Medical): No    Lack of Transportation (Non-Medical): No  Physical Activity: Unknown (11/29/2022)   Received from [facility]   Exercise Vital Sign    Days of Exercise per Week: 0 days    Minutes of Exercise per Session: Not on file  Stress: No Stress Concern Present (11/29/2022)   Received from [facility] - Occupational Stress Questionnaire    Feeling of Stress : Not at all  Social Connections: Unknown (09/11/2023)   Social Connection and Isolation Panel [NHANES]    Frequency of Communication with Friends  and Family: More than three times a week    Frequency of Social Gatherings with Friends and Family: More than three times a week    Attends Religious Services: Patient declined    Database administrator or Organizations: No    Attends Engineer, structural: Patient declined    Marital Status: Married    Allergies:  No Known Allergies  Objective:    Vital Signs:   Temp:  [97.6 F (36.4 C)-98.2 F (36.8 C)] 97.7 F (36.5 C) (04/29 1118) Pulse Rate:  [75-170] 84 (04/29 1215) Resp:  [11-30] 22 (04/29 1215) BP: (81-110)/(51-90) 99/64 (04/29 1215) SpO2:  [88 %-96 %] 96 % (04/29 1215) Last BM Date : 09/10/23  Weight change: Filed Weights   09/11/23 0245  Weight: 82.6 kg    Intake/Output:   Intake/Output Summary (Last 24 hours) at 09/12/2023 1321 Last data filed at 09/12/2023 1246 Gross per 24 hour  Intake 1037.28 ml  Output 1300 ml  Net -262.72 ml      Physical Exam    General:  Well appearing, obese. No resp difficulty HEENT: normal Neck: supple. Thick neck, JVD not well visualized . Carotids 2+ bilat; no bruits. No lymphadenopathy or thyromegaly appreciated. Cor: PMI nondisplaced. Regular rate & rhythm. No rubs, gallops or murmurs. Lungs: clear Abdomen: soft, nontender, nondistended. No hepatosplenomegaly. No bruits or  masses. Good bowel sounds. Extremities: no cyanosis, clubbing, rash, edema Neuro: alert & orientedx3, cranial nerves grossly intact. moves all 4 extremities w/o difficulty. Affect pleasant   Telemetry   NSR 90s, personally reviewed   EKG    NSR 78 bpm (post cardioversion)   Labs   Basic Metabolic Panel: Recent Labs  Lab 09/10/23 1936 09/11/23 0826 09/12/23 0241  NA 141 139 139  K 4.4 3.7 3.7  CL 103 110 105  CO2 27 23 24   GLUCOSE 126* 94 109*  BUN 28* 20 20  CREATININE 1.68* 1.22 1.38*  CALCIUM  9.3 8.4* 8.6*  MG  --   --  1.8    Liver Function Tests: Recent Labs  Lab 09/11/23 0826  AST 20  ALT 16  ALKPHOS 71  BILITOT 0.7  PROT 5.8*  ALBUMIN 3.2*   No results for input(s): "LIPASE", "AMYLASE" in the last 168 hours. No results for input(s): "AMMONIA" in the last 168 hours.  CBC: Recent Labs  Lab 09/10/23 1936 09/11/23 0826 09/12/23 0241  WBC 9.8 9.6 8.7  NEUTROABS  --  5.8  --   HGB 14.7 13.5 13.5  HCT 41.7 38.8* 39.0  MCV 90.3 90.9 90.9  PLT 247 205 218    Cardiac Enzymes: No results for input(s): "CKTOTAL", "CKMB", "CKMBINDEX", "TROPONINI" in the last 168 hours.  BNP: BNP (last 3 results) No results for input(s): "BNP" in the last 8760 hours.  ProBNP (last 3 results) No results for input(s): "PROBNP" in the last 8760 hours.   CBG: Recent Labs  Lab 09/11/23 1212 09/11/23 1614 09/11/23 2058 09/12/23 0553 09/12/23 1103  GLUCAP 143* 110* 106* 93 116*    Coagulation Studies: No results for input(s): "LABPROT", "INR" in the last 72 hours.   Imaging   ECHO TEE Result Date: 09/12/2023    TRANSESOPHOGEAL ECHO REPORT   Patient Name:   Gary Keller Date of Exam: 09/12/2023 Medical Rec #:  [id number]           Height:       62.0 in Accession #:    [id number]  Weight:       182.1 lb Date of Birth:  09/05/1950            BSA:          1.837 m Patient Age:    73 years            BP:           106/90 mmHg Patient Gender: M                    HR:           165 bpm. Exam Location:  Inpatient Procedure: Transesophageal Echo, Color Doppler and Cardiac Doppler (Both            Spectral and Color Flow Doppler were utilized during procedure). Indications:     Cardioversion  History:         Patient has prior history of Echocardiogram examinations, most                  recent 09/11/2023.  Sonographer:     Hersey Lorenzo RDCS Referring Phys:  [id number] HAO MENG Diagnosing Phys: Maudine Sos MD PROCEDURE: The transesophogeal probe was passed without difficulty through the esophogus of the patient. Sedation performed by different physician. Image quality was good. The patient's vital signs; including heart rate, blood pressure, and oxygen saturation; remained stable throughout the procedure. The patient developed hypotension during the procedure. A successful direct current cardioversion was performed at 200 joules with 1 attempt.  IMPRESSIONS  1. Patient was tachycardic in the 160s throughout the procedure.  2. Left ventricular ejection fraction, by estimation, is 55 to 60%. The left ventricle has normal function. The left ventricle has no regional wall motion abnormalities.  3. Right ventricular systolic function is mildly reduced. The right ventricular size is normal.  4. No left atrial/left atrial appendage thrombus was detected. The LAA emptying velocity was 102 cm/s.  5. The mitral valve is normal in structure. Trivial mitral valve regurgitation. No evidence of mitral stenosis.  6. The aortic valve is tricuspid. Aortic valve regurgitation is not visualized. No aortic stenosis is present.  7. The inferior vena cava is normal in size with greater than 50% respiratory variability, suggesting right atrial pressure of 3 mmHg. Conclusion(s)/Recommendation(s): No LA/LAA thrombus identified. Successful cardioversion performed with restoration of normal sinus rhythm. FINDINGS  Left Ventricle: Left ventricular ejection fraction, by estimation, is 55 to 60%.  The left ventricle has normal function. The left ventricle has no regional wall motion abnormalities. The left ventricular internal cavity size was normal in size. There is  no left ventricular hypertrophy. Right Ventricle: The right ventricular size is normal. No increase in right ventricular wall thickness. Right ventricular systolic function is mildly reduced. Left Atrium: Left atrial size was normal in size. No left atrial/left atrial appendage thrombus was detected. The LAA emptying velocity was 102 cm/s. Right Atrium: Right atrial size was normal in size. Pericardium: There is no evidence of pericardial effusion. Mitral Valve: The mitral valve is normal in structure. Trivial mitral valve regurgitation. No evidence of mitral valve stenosis. Tricuspid Valve: The tricuspid valve is normal in structure. Tricuspid valve regurgitation is trivial. No evidence of tricuspid stenosis. Aortic Valve: The aortic valve is tricuspid. Aortic valve regurgitation is not visualized. No aortic stenosis is present. Pulmonic Valve: The pulmonic valve was normal in structure. Pulmonic valve regurgitation is not visualized. No evidence of pulmonic stenosis. Aorta: The aortic root is normal in size and structure.  There is minimal (Grade I) plaque. Venous: The inferior vena cava is normal in size with greater than 50% respiratory variability, suggesting right atrial pressure of 3 mmHg. IAS/Shunts: No atrial level shunt detected by color flow Doppler. Maudine Sos MD Electronically signed by Maudine Sos MD Signature Date/Time: 09/12/2023/11:34:57 AM    Final (Updated)    EP STUDY Result Date: 09/12/2023 See surgical note for result.  ECHOCARDIOGRAM COMPLETE Result Date: 09/11/2023    ECHOCARDIOGRAM REPORT   Patient Name:   Gary Keller Date of Exam: 09/11/2023 Medical Rec #:  [id number]           Height:       62.0 in Accession #:    [id number]          Weight:       182.1 lb Date of Birth:  04/04/1951            BSA:           1.837 m Patient Age:    73 years            BP:           82/53 mmHg Patient Gender: M                   HR:           95 bpm. Exam Location:  Inpatient Procedure: 2D Echo, Cardiac Doppler and Color Doppler (Both Spectral and Color            Flow Doppler were utilized during procedure). Indications:    A-fib with RVR  History:        Patient has no prior history of Echocardiogram examinations.                 CKD, Arrythmias:Atrial Fibrillation; Risk Factors:Former Smoker,                 Sleep Apnea, Hypertension, Dyslipidemia and Diabetes.  Sonographer:    Adelia Homestead RVT RCS Referring Phys: [id number] ARSHAD N [facility] IMPRESSIONS  1. Left ventricular ejection fraction, by estimation, is 60 to 65%. The left ventricle has normal function. The left ventricle has no regional wall motion abnormalities. Left ventricular diastolic function could not be evaluated due to atrial flutter.  2. Right ventricular systolic function is mildly reduced. The right ventricular size is normal. Visually right ventricular wall thickness is above normal limits. There is normal pulmonary artery systolic pressure. The estimated right ventricular systolic pressure is 32.4 mmHg.  3. The mitral valve is normal in structure. Mild to moderate mitral valve regurgitation. No evidence of mitral stenosis.  4. The aortic valve is tricuspid. Aortic valve regurgitation is not visualized. Aortic valve sclerosis is present, with no evidence of aortic valve stenosis. Comparison(s): No prior Echocardiogram. FINDINGS  Left Ventricle: Left ventricular ejection fraction, by estimation, is 60 to 65%. The left ventricle has normal function. The left ventricle has no regional wall motion abnormalities. The left ventricular internal cavity size was normal in size. There is  borderline left ventricular hypertrophy. Left ventricular diastolic function could not be evaluated due to atrial flutter. Right Ventricle: The right ventricular size is normal.  Visually right ventricular wall thickness is above normal limits. Right ventricular systolic function is mildly reduced. There is normal pulmonary artery systolic pressure. The tricuspid regurgitant velocity is 2.71 m/s, and with an assumed right atrial pressure of 3 mmHg, the estimated right ventricular systolic pressure is 32.4 mmHg. Left Atrium: Left  atrial size was normal in size. Right Atrium: Right atrial size was normal in size. Pericardium: There is no evidence of pericardial effusion. Mitral Valve: The mitral valve is normal in structure. Mild to moderate mitral valve regurgitation. No evidence of mitral valve stenosis. Tricuspid Valve: The tricuspid valve is normal in structure. Tricuspid valve regurgitation is mild . No evidence of tricuspid stenosis. Aortic Valve: The aortic valve is tricuspid. Aortic valve regurgitation is not visualized. Aortic valve sclerosis is present, with no evidence of aortic valve stenosis. Aortic valve mean gradient measures 2.0 mmHg. Aortic valve peak gradient measures 3.8  mmHg. Aortic valve area, by VTI measures 1.85 cm. Pulmonic Valve: The pulmonic valve was normal in structure. Pulmonic valve regurgitation is mild. No evidence of pulmonic stenosis. Aorta: The aortic root and ascending aorta are structurally normal, with no evidence of dilitation. IAS/Shunts: The atrial septum is grossly normal.  LEFT VENTRICLE PLAX 2D LVIDd:         4.00 cm   Diastology LVIDs:         2.50 cm   LV e' medial:    10.20 cm/s LV PW:         1.20 cm   LV E/e' medial:  12.2 LV IVS:        1.10 cm   LV e' lateral:   12.20 cm/s LVOT diam:     1.60 cm   LV E/e' lateral: 10.2 LV SV:         30 LV SV Index:   16 LVOT Area:     2.01 cm  RIGHT VENTRICLE            IVC RV Basal diam:  3.00 cm    IVC diam: 1.50 cm RV Mid diam:    2.70 cm RV S prime:     8.19 cm/s LEFT ATRIUM             Index        RIGHT ATRIUM           Index LA diam:        4.10 cm 2.23 cm/m   RA Area:     12.90 cm LA Vol (A2C):    41.7 ml 22.70 ml/m  RA Volume:   28.80 ml  15.68 ml/m LA Vol (A4C):   30.8 ml 16.77 ml/m LA Biplane Vol: 38.0 ml 20.69 ml/m  AORTIC VALVE                    PULMONIC VALVE AV Area (Vmax):    1.85 cm     PV Vmax:       0.63 m/s AV Area (Vmean):   1.75 cm     PV Peak grad:  1.6 mmHg AV Area (VTI):     1.85 cm AV Vmax:           97.70 cm/s AV Vmean:          65.600 cm/s AV VTI:            0.161 m AV Peak Grad:      3.8 mmHg AV Mean Grad:      2.0 mmHg LVOT Vmax:         90.10 cm/s LVOT Vmean:        57.000 cm/s LVOT VTI:          0.148 m LVOT/AV VTI ratio: 0.92  AORTA Ao Root diam: 3.20 cm Ao Asc diam:  3.20 cm MITRAL VALVE  TRICUSPID VALVE MV Area (PHT): 5.56 cm     TR Peak grad:   29.4 mmHg MV Decel Time: 137 msec     TR Vmax:        271.00 cm/s MV E velocity: 124.00 cm/s MV A velocity: 40.80 cm/s   SHUNTS MV E/A ratio:  3.04         Systemic VTI:  0.15 m                             Systemic Diam: 1.60 cm Sunit Tolia Electronically signed by Olinda Bertrand Signature Date/Time: 09/11/2023/4:14:22 PM    Final      Medications:     Current Medications:  apixaban  5 mg Oral BID   atorvastatin   80 mg Oral Daily   Chlorhexidine Gluconate Cloth  6 each Topical Daily   fenofibrate  160 mg Oral Daily   icosapent  Ethyl  2 g Oral BID   insulin aspart  0-9 Units Subcutaneous TID WC   metoprolol  succinate  50 mg Oral Daily   pantoprazole  40 mg Oral Daily   Pirfenidone   801 mg Oral TID   potassium chloride  20 mEq Oral Once   predniSONE   5 mg Oral Once per day on Sunday Tuesday Thursday Saturday   venlafaxine XR  75 mg Oral Q breakfast    Infusions:  sodium chloride 250 mL (09/12/23 1254)   [COMPLETED] lactated ringers 200 mL/hr at 09/12/23 1246   magnesium sulfate bolus IVPB 2 g (09/12/23 1255)   phenylephrine (NEO-SYNEPHRINE) Adult infusion Stopped (09/12/23 1227)      Patient Profile   74 y/o male w/ h/o CAD, pulmonary fibrosis followed by Dr. Villa Greaser, chronic hypoxic respiratory  failure on home O2 (2L/min baseline), OSA on CPAP, HTN and HLD, who was admitted for new onset atrial flutter w/ RVR. Seen today after being transferred to CCU for persistent hypotension post TEE/DCCV requiring vasopressor support.  Assessment/Plan   1. Peri procedural Hypotension  - persistently hypotensive post TEE/DCCV - suspect 2/2 sedation in setting of baseline low BP  - BP stabilized w/ Midodrine and NEO gtt, now off w/ SBPs in low 90s. Asymptomatic  - received IVF boluses  - will monitor in CCU overnight off support. If remains stable, plan transfer out tomorrow   2. Aflutter w/ RVR - new onset, TSH nl. HS trop low level and flat/ not c/w ACS. Echo w/ normal LVEF, LA size normal, 4.1 cm - occurrence in the letting of underlying pulmonary disease (pulm fibrosis) and OSA - s/p successful TEE/DCCV, maintaining NSR - Toprol  XL 50 mg for rate control - no amio w/ pulmonary fibrosis  - recommend outpatient referral to EP for possible AFL ablation vs consideration of another AAD  - Eliquis for a/c   3. Chronic Hypoxic Respiratory Failure - 2/2 pulm fibrosis, followed by Dr. Villa Greaser. Stable   - on 2L [place] chronically   4. OSA - continue CPAP at bedtime   5. CAD - remote PCI - denies CP - continue medical therapy   6. 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

## 2023-09-12 NOTE — Transfer of Care (Signed)
 Immediate Anesthesia Transfer of Care Note  Patient: [name]  Procedure(s) Performed: TRANSESOPHAGEAL ECHOCARDIOGRAM CARDIOVERSION  Patient Location: PACU and Endoscopy Unit  Anesthesia Type:MAC  Level of Consciousness: awake and alert   Airway & Oxygen Therapy: Patient Spontanous Breathing and Patient connected to face mask oxygen  Post-op Assessment: Report given to RN and Post -op Vital signs reviewed and stable  Post vital signs: Reviewed and stable  Last Vitals:  Vitals Value Taken Time  BP 82/52 09/12/23 1118  Temp 36.5 C 09/12/23 1118  Pulse 77 09/12/23 1118  Resp 16 09/12/23 1118  SpO2 88 % 09/12/23 1118    Last Pain:  Vitals:   09/12/23 1118  TempSrc: Tympanic  PainSc: 0-No pain      Patients Stated Pain Goal: 0 (09/10/23 1934)  Complications: No notable events documented.

## 2023-09-12 NOTE — Progress Notes (Signed)
   09/12/23 0331  Vitals  Temp 97.6 F (36.4 C)  Temp Source Axillary  BP (!) 81/71  MAP (mmHg) 77  Pulse Rate (!) 170  ECG Heart Rate (!) 171  Resp 20  MEWS COLOR  MEWS Score Color Red  Oxygen Therapy  SpO2 95 %  O2 Flow Rate (L/min) 3 L/min  MEWS Score  MEWS Temp 0  MEWS Systolic 1  MEWS Pulse 3  MEWS RR 0  MEWS LOC 0  MEWS Score 4  Provider Notification  Provider Name/Title Donna Fus, MD  Date Provider Notified 09/12/23  Time Provider Notified [phone]  Method of Notification Page  Notification Reason Other (Comment) (red mews)  Provider response See new orders  Date of Provider Response 09/12/23  Time of Provider Response [phone]   Patient remains asymptomatic. Cardizem stopped per parameter instructions on MAR.  MD notified. Verbal orders given for 500cc bolus over an hour and slowly restart cardizem if BP allows.

## 2023-09-12 NOTE — Progress Notes (Signed)
 Rounding Note    Patient Name: Gary Keller Date of Encounter: 09/12/2023  [facility] Cardiologist: Alexandria Angel, MD   Subjective   No CP or dyspnea  Inpatient Medications    Scheduled Meds:  apixaban  5 mg Oral BID   atorvastatin   80 mg Oral Daily   fenofibrate  160 mg Oral Daily   icosapent  Ethyl  2 g Oral BID   insulin aspart  0-9 Units Subcutaneous TID WC   metoprolol  succinate  50 mg Oral Daily   pantoprazole  40 mg Oral Daily   Pirfenidone   801 mg Oral TID   predniSONE   5 mg Oral Once per day on Sunday Tuesday Thursday Saturday   sodium chloride flush  3-10 mL Intravenous Q12H   venlafaxine XR  75 mg Oral Q breakfast   Continuous Infusions:  diltiazem (CARDIZEM) infusion 10 mg/hr (09/12/23 0449)   PRN Meds: sodium chloride flush   Vital Signs    Vitals:   09/12/23 0029 09/12/23 0331 09/12/23 0431 09/12/23 0652  BP: 90/62 (!) 81/71 (!) 110/90 104/85  Pulse: 95 (!) 170 (!) 166 (!) 166  Resp: 20 20 19 20   Temp:  97.6 F (36.4 C)  97.7 F (36.5 C)  TempSrc:  Axillary  Oral  SpO2: 94% 95% 96% 93%  Weight:      Height:        Intake/Output Summary (Last 24 hours) at 09/12/2023 0803 Last data filed at 09/12/2023 4098 Gross per 24 hour  Intake 527.1 ml  Output 1000 ml  Net -472.9 ml      09/11/2023    2:45 AM 07/25/2023    3:53 PM 05/01/2023    3:45 PM  Last 3 Weights  Weight (lbs) 182 lb 1.6 oz 178 lb 1.6 oz 184 lb  Weight (kg) 82.6 kg 80.786 kg 83.462 kg      Telemetry    Atrial flutter with rapid ventricular response - Personally Reviewed  Physical Exam   GEN: No acute distress.   Neck: No JVD Cardiac: Regular and tachycardic Respiratory: Clear to auscultation bilaterally. GI: Soft, nontender, non-distended  MS: No edema Neuro:  Nonfocal  Psych: Normal affect   Labs     Chemistry Recent Labs  Lab 09/10/23 1936 09/11/23 0826 09/12/23 0241  NA 141 139 139  K 4.4 3.7 3.7  CL 103 110 105  CO2 27 23 24    GLUCOSE 126* 94 109*  BUN 28* 20 20  CREATININE 1.68* 1.22 1.38*  CALCIUM  9.3 8.4* 8.6*  MG  --   --  1.8  PROT  --  5.8*  --   ALBUMIN  --  3.2*  --   AST  --  20  --   ALT  --  16  --   ALKPHOS  --  71  --   BILITOT  --  0.7  --   GFRNONAA 42* >60 54*  ANIONGAP 10 6 10      Hematology Recent Labs  Lab 09/10/23 1936 09/11/23 0826 09/12/23 0241  WBC 9.8 9.6 8.7  RBC 4.62 4.27 4.29  HGB 14.7 13.5 13.5  HCT 41.7 38.8* 39.0  MCV 90.3 90.9 90.9  MCH 31.8 31.6 31.5  MCHC 35.3 34.8 34.6  RDW 12.2 12.5 12.4  PLT 247 205 218   Thyroid  Recent Labs  Lab 09/11/23 0826  TSH 3.253     Radiology    ECHOCARDIOGRAM COMPLETE Result Date: 09/11/2023    ECHOCARDIOGRAM REPORT  Patient Name:   Gary Keller Date of Exam: 09/11/2023 Medical Rec #:  [id number]           Height:       62.0 in Accession #:    [id number]          Weight:       182.1 lb Date of Birth:  1950-06-13            BSA:          1.837 m Patient Age:    73 years            BP:           82/53 mmHg Patient Gender: M                   HR:           95 bpm. Exam Location:  Inpatient Procedure: 2D Echo, Cardiac Doppler and Color Doppler (Both Spectral and Color            Flow Doppler were utilized during procedure). Indications:    A-fib with RVR  History:        Patient has no prior history of Echocardiogram examinations.                 CKD, Arrythmias:Atrial Fibrillation; Risk Factors:Former Smoker,                 Sleep Apnea, Hypertension, Dyslipidemia and Diabetes.  Sonographer:    Adelia Homestead RVT RCS Referring Phys: [id number] ARSHAD N [facility] IMPRESSIONS  1. Left ventricular ejection fraction, by estimation, is 60 to 65%. The left ventricle has normal function. The left ventricle has no regional wall motion abnormalities. Left ventricular diastolic function could not be evaluated due to atrial flutter.  2. Right ventricular systolic function is mildly reduced. The right ventricular size is normal. Visually right  ventricular wall thickness is above normal limits. There is normal pulmonary artery systolic pressure. The estimated right ventricular systolic pressure is 32.4 mmHg.  3. The mitral valve is normal in structure. Mild to moderate mitral valve regurgitation. No evidence of mitral stenosis.  4. The aortic valve is tricuspid. Aortic valve regurgitation is not visualized. Aortic valve sclerosis is present, with no evidence of aortic valve stenosis. Comparison(s): No prior Echocardiogram. FINDINGS  Left Ventricle: Left ventricular ejection fraction, by estimation, is 60 to 65%. The left ventricle has normal function. The left ventricle has no regional wall motion abnormalities. The left ventricular internal cavity size was normal in size. There is  borderline left ventricular hypertrophy. Left ventricular diastolic function could not be evaluated due to atrial flutter. Right Ventricle: The right ventricular size is normal. Visually right ventricular wall thickness is above normal limits. Right ventricular systolic function is mildly reduced. There is normal pulmonary artery systolic pressure. The tricuspid regurgitant velocity is 2.71 m/s, and with an assumed right atrial pressure of 3 mmHg, the estimated right ventricular systolic pressure is 32.4 mmHg. Left Atrium: Left atrial size was normal in size. Right Atrium: Right atrial size was normal in size. Pericardium: There is no evidence of pericardial effusion. Mitral Valve: The mitral valve is normal in structure. Mild to moderate mitral valve regurgitation. No evidence of mitral valve stenosis. Tricuspid Valve: The tricuspid valve is normal in structure. Tricuspid valve regurgitation is mild . No evidence of tricuspid stenosis. Aortic Valve: The aortic valve is tricuspid. Aortic valve regurgitation is not visualized. Aortic valve sclerosis is present, with  no evidence of aortic valve stenosis. Aortic valve mean gradient measures 2.0 mmHg. Aortic valve peak gradient  measures 3.8  mmHg. Aortic valve area, by VTI measures 1.85 cm. Pulmonic Valve: The pulmonic valve was normal in structure. Pulmonic valve regurgitation is mild. No evidence of pulmonic stenosis. Aorta: The aortic root and ascending aorta are structurally normal, with no evidence of dilitation. IAS/Shunts: The atrial septum is grossly normal.  LEFT VENTRICLE PLAX 2D LVIDd:         4.00 cm   Diastology LVIDs:         2.50 cm   LV e' medial:    10.20 cm/s LV PW:         1.20 cm   LV E/e' medial:  12.2 LV IVS:        1.10 cm   LV e' lateral:   12.20 cm/s LVOT diam:     1.60 cm   LV E/e' lateral: 10.2 LV SV:         30 LV SV Index:   16 LVOT Area:     2.01 cm  RIGHT VENTRICLE            IVC RV Basal diam:  3.00 cm    IVC diam: 1.50 cm RV Mid diam:    2.70 cm RV S prime:     8.19 cm/s LEFT ATRIUM             Index        RIGHT ATRIUM           Index LA diam:        4.10 cm 2.23 cm/m   RA Area:     12.90 cm LA Vol (A2C):   41.7 ml 22.70 ml/m  RA Volume:   28.80 ml  15.68 ml/m LA Vol (A4C):   30.8 ml 16.77 ml/m LA Biplane Vol: 38.0 ml 20.69 ml/m  AORTIC VALVE                    PULMONIC VALVE AV Area (Vmax):    1.85 cm     PV Vmax:       0.63 m/s AV Area (Vmean):   1.75 cm     PV Peak grad:  1.6 mmHg AV Area (VTI):     1.85 cm AV Vmax:           97.70 cm/s AV Vmean:          65.600 cm/s AV VTI:            0.161 m AV Peak Grad:      3.8 mmHg AV Mean Grad:      2.0 mmHg LVOT Vmax:         90.10 cm/s LVOT Vmean:        57.000 cm/s LVOT VTI:          0.148 m LVOT/AV VTI ratio: 0.92  AORTA Ao Root diam: 3.20 cm Ao Asc diam:  3.20 cm MITRAL VALVE                TRICUSPID VALVE MV Area (PHT): 5.56 cm     TR Peak grad:   29.4 mmHg MV Decel Time: 137 msec     TR Vmax:        271.00 cm/s MV E velocity: 124.00 cm/s MV A velocity: 40.80 cm/s   SHUNTS MV E/A ratio:  3.04         Systemic VTI:  0.15 m  Systemic Diam: 1.60 cm Sunit Tolia Electronically signed by Olinda Bertrand Signature Date/Time:  09/11/2023/4:14:22 PM    Final    DG Chest Port 1 View Result Date: 09/10/2023 CLINICAL DATA:  [id number] Chest pain [id number] EXAM: PORTABLE CHEST - 1 VIEW COMPARISON:  08/25/2022. FINDINGS: The heart size and mediastinal contours are within normal limits. There is diffuse pulmonary interstitial prominence which could be seen with atypical infection, edema or interstitial lung disease. There is no focal consolidation. No pneumothorax or pleural effusion. There are thoracic degenerative changes. IMPRESSION: Nonspecific interstitial prominence consistent with atypical infection, edema or interstitial lung disease. No focal consolidation. Electronically Signed   By: Sydell Eva M.D.   On: 09/10/2023 20:49     Patient Profile     73 y.o. male with past medical history of coronary artery disease, pulmonary fibrosis on chronic home oxygen, hypertension, hyperlipidemia, obstructive sleep apnea with atrial flutter.  Echocardiogram this admission shows ejection fraction 60 to 65%, mild RV dysfunction, mild to moderate mitral regurgitation.  Assessment & Plan    1 new onset atrial flutter-patient remains in atrial flutter this morning.  His heart rate is elevated and blood pressure is borderline.  Continue Cardizem and metoprolol  as tolerated by blood pressure.  He will receive his third dose of Eliquis this morning.  Proceed with TEE guided cardioversion today.  Plan outpatient follow-up with electrophysiology for consideration of atrial flutter ablation following discharge.  I think he will be at increased risk for recurrent atrial arrhythmias given baseline pulmonary disease.     2 coronary artery disease-Continue statin.  He denies chest pain similar to the symptoms he had prior to his PCI.   3 hypertension-losartan  is on hold.  Continue Cardizem and metoprolol  and adjust regimen based on follow-up blood pressure readings.   4 hyperlipidemia-continue statin.   5 pulmonary disease-follow-up pulmonary.   Continue preadmission medications.   6 mildly elevated troponin-no clear trend and not consistent with acute coronary syndrome.   7 obstructive sleep apnea-continue CPAP.  For questions or updates, please contact [facility] Please consult [email or web address] for contact info under        Signed, Alexandria Angel, MD  09/12/2023, 8:03 AM

## 2023-09-12 NOTE — Interval H&P Note (Signed)
 History and Physical Interval Note:  09/12/2023 8:33 AM  Gary Keller  has presented today for surgery, with the diagnosis of aflutter.  The various methods of treatment have been discussed with the patient and family. After consideration of risks, benefits and other options for treatment, the patient has consented to  Procedure(s): TRANSESOPHAGEAL ECHOCARDIOGRAM (N/A) CARDIOVERSION (N/A) as a surgical intervention.  The patient's history has been reviewed, patient examined, no change in status, stable for surgery.  I have reviewed the patient's chart and labs.  Questions were answered to the patient's satisfaction.     Maudine Sos, MD

## 2023-09-12 NOTE — Progress Notes (Signed)
 PROGRESS NOTE  Vanson Soldan ZOX:[id number] DOB: 1950/12/11 DOA: 09/10/2023 PCP: Medicine, [facility]   LOS: 1 day   Brief narrative: Gary Keller is a 73 y.o. male with history of CAD status post PCI, interstitial lung disease, sleep apnea, hypertension, chronic kidney disease stage III, diabetes mellitus type 2, depression presented hospital with persistent palpitation for 48 hours even at rest with some chest pressure.  In the ED patient was noted to be in atrial flutter with RVR and was started on Cardizem infusion.  Labs showed creatinine elevation at 1.6.  Troponins were 127 followed by 108.  Cardiology was consulted and patient was admitted hospital for further evaluation and treatment.     Assessment/Plan: Principal Problem:   Atrial flutter with rapid ventricular response (HCC) Active Problems:   Essential hypertension   CAD S/P PCI 2010   Non-insulin dependent type 2 diabetes mellitus (HCC)   HTN (hypertension)   Dyslipidemia, goal LDL below 70   ILD (interstitial lung disease) (HCC)   OSA (obstructive sleep apnea)   Chronic kidney disease (CKD) stage G3b/A2, moderately decreased glomerular filtration rate (GFR) between 30-44 mL/min/1.73 square meter and albuminuria creatinine ratio between 30-299 mg/g (HCC)   Atrial fibrillation with rapid ventricular response (HCC)   Atrial flutter with RVR  Heart rate improved with Cardizem drip but blood pressure marginally low.  Cardiology followed the patient and patient underwent DC cardioversion on 09/12/2023.  Subsequently after cardioversion patient was hypotensive requiring IV fluids, albumin and Neo-Synephrine was transitioned to ICU care.  CHADS2 Vasc score of at least 4.   Will continue Eliquis.  TSH of 3.2.  2D echocardiogram showed LV ejection fraction of 55 to 60%.  Will follow cardiology recommendation.  CAD status post PCI in 2010.   Troponins flat, continue beta-blockers and aspirin.  Cardiology  on board.  Diabetes mellitus type 2 patient states he takes Mounjaro.  Last hemoglobin A1c was 5.2 as per the notes in epic.  Hypertension on beta-blockers and losartan  at home.  Currently on metoprolol .  Interstitial lung disease on pirfenidone  and prednisone .  Follows up with Dr. Villa Greaser pulmonary.  Chronically on 5 mg of prednisone .  Takes 3 L of oxygen at baseline.  OSA on CPAP at bedtime.  Chronic kidney disease stage IIIa creatinine at around baseline.   DVT prophylaxis:  apixaban (ELIQUIS) tablet 5 mg   Disposition: Home likely in 1-2 days when ok with cardiology  Status is: Inpatient Remains inpatient appropriate because: Status post cardioversion, hypotension, needing vasopressors.    Code Status:     Code Status: Full Code  Family Communication: Spoke with the patient's family at bedside.  Consultants: Cardiology  Procedures: DC cardioversion 09/12/2023  Anti-infectives:  None  Anti-infectives (From admission, onward)    None       Subjective: Today, patient was seen and examined at bedside.  Patient seen after cardioversion.  Required vasopressors after cardioversion but denies any chest pain shortness of breath dyspnea or dizziness.  Objective: Vitals:   09/12/23 1200 09/12/23 1215  BP:  99/64  Pulse: 83 84  Resp: (!) 30 (!) 22  Temp:    SpO2: 93% 96%    Intake/Output Summary (Last 24 hours) at 09/12/2023 1316 Last data filed at 09/12/2023 1246 Gross per 24 hour  Intake 1037.28 ml  Output 1300 ml  Net -262.72 ml   Filed Weights   09/11/23 0245  Weight: 82.6 kg   Body mass index is 33.31 kg/m.  Physical Exam:  GENERAL: Patient is alert awake and oriented. Not in obvious distress.  Obese. HENT: No scleral pallor or icterus. Pupils equally reactive to light. Oral mucosa is moist NECK: is supple, no gross swelling noted. CHEST: Clear to auscultation. No crackles or wheezes.   CVS: Regular rhythm. ABDOMEN: Soft, non-tender, bowel sounds  are present. EXTREMITIES: No edema. CNS: Cranial nerves are intact. No focal motor deficits. SKIN: warm and dry without rashes.  Data Review: I have personally reviewed the following laboratory data and studies,  CBC: Recent Labs  Lab 09/10/23 1936 09/11/23 0826 09/12/23 0241  WBC 9.8 9.6 8.7  NEUTROABS  --  5.8  --   HGB 14.7 13.5 13.5  HCT 41.7 38.8* 39.0  MCV 90.3 90.9 90.9  PLT 247 205 218   Basic Metabolic Panel: Recent Labs  Lab 09/10/23 1936 09/11/23 0826 09/12/23 0241  NA 141 139 139  K 4.4 3.7 3.7  CL 103 110 105  CO2 27 23 24   GLUCOSE 126* 94 109*  BUN 28* 20 20  CREATININE 1.68* 1.22 1.38*  CALCIUM  9.3 8.4* 8.6*  MG  --   --  1.8   Liver Function Tests: Recent Labs  Lab 09/11/23 0826  AST 20  ALT 16  ALKPHOS 71  BILITOT 0.7  PROT 5.8*  ALBUMIN 3.2*   No results for input(s): "LIPASE", "AMYLASE" in the last 168 hours. No results for input(s): "AMMONIA" in the last 168 hours. Cardiac Enzymes: No results for input(s): "CKTOTAL", "CKMB", "CKMBINDEX", "TROPONINI" in the last 168 hours. BNP (last 3 results) No results for input(s): "BNP" in the last 8760 hours.  ProBNP (last 3 results) No results for input(s): "PROBNP" in the last 8760 hours.  CBG: Recent Labs  Lab 09/11/23 1212 09/11/23 1614 09/11/23 2058 09/12/23 0553 09/12/23 1103  GLUCAP 143* 110* 106* 93 116*   Recent Results (from the past 240 hours)  Surgical pcr screen     Status: None   Collection Time: 09/11/23  2:53 AM   Specimen: Nasal Mucosa; Nasal Swab  Result Value Ref Range Status   MRSA, PCR NEGATIVE NEGATIVE Final   Staphylococcus aureus NEGATIVE NEGATIVE Final    Comment: (NOTE) The Xpert SA Assay (FDA approved for NASAL specimens in patients 23 years of age and older), is one component of a comprehensive surveillance program. It is not intended to diagnose infection nor to guide or monitor treatment. Performed at [facility], [address],  [place], [place] [address]      Studies: ECHO TEE Result Date: 09/12/2023    TRANSESOPHOGEAL ECHO REPORT   Patient Name:   Gary Keller Date of Exam: 09/12/2023 Medical Rec #:  [id number]           Height:       62.0 in Accession #:    [id number]          Weight:       182.1 lb Date of Birth:  1951-02-03            BSA:          1.837 m Patient Age:    73 years            BP:           106/90 mmHg Patient Gender: M                   HR:  165 bpm. Exam Location:  Inpatient Procedure: Transesophageal Echo, Color Doppler and Cardiac Doppler (Both            Spectral and Color Flow Doppler were utilized during procedure). Indications:     Cardioversion  History:         Patient has prior history of Echocardiogram examinations, most                  recent 09/11/2023.  Sonographer:     Hersey Lorenzo RDCS Referring Phys:  [id number] HAO MENG Diagnosing Phys: Maudine Sos MD PROCEDURE: The transesophogeal probe was passed without difficulty through the esophogus of the patient. Sedation performed by different physician. Image quality was good. The patient's vital signs; including heart rate, blood pressure, and oxygen saturation; remained stable throughout the procedure. The patient developed hypotension during the procedure. A successful direct current cardioversion was performed at 200 joules with 1 attempt.  IMPRESSIONS  1. Patient was tachycardic in the 160s throughout the procedure.  2. Left ventricular ejection fraction, by estimation, is 55 to 60%. The left ventricle has normal function. The left ventricle has no regional wall motion abnormalities.  3. Right ventricular systolic function is mildly reduced. The right ventricular size is normal.  4. No left atrial/left atrial appendage thrombus was detected. The LAA emptying velocity was 102 cm/s.  5. The mitral valve is normal in structure. Trivial mitral valve regurgitation. No evidence of mitral stenosis.  6. The aortic valve is tricuspid. Aortic  valve regurgitation is not visualized. No aortic stenosis is present.  7. The inferior vena cava is normal in size with greater than 50% respiratory variability, suggesting right atrial pressure of 3 mmHg. Conclusion(s)/Recommendation(s): No LA/LAA thrombus identified. Successful cardioversion performed with restoration of normal sinus rhythm. FINDINGS  Left Ventricle: Left ventricular ejection fraction, by estimation, is 55 to 60%. The left ventricle has normal function. The left ventricle has no regional wall motion abnormalities. The left ventricular internal cavity size was normal in size. There is  no left ventricular hypertrophy. Right Ventricle: The right ventricular size is normal. No increase in right ventricular wall thickness. Right ventricular systolic function is mildly reduced. Left Atrium: Left atrial size was normal in size. No left atrial/left atrial appendage thrombus was detected. The LAA emptying velocity was 102 cm/s. Right Atrium: Right atrial size was normal in size. Pericardium: There is no evidence of pericardial effusion. Mitral Valve: The mitral valve is normal in structure. Trivial mitral valve regurgitation. No evidence of mitral valve stenosis. Tricuspid Valve: The tricuspid valve is normal in structure. Tricuspid valve regurgitation is trivial. No evidence of tricuspid stenosis. Aortic Valve: The aortic valve is tricuspid. Aortic valve regurgitation is not visualized. No aortic stenosis is present. Pulmonic Valve: The pulmonic valve was normal in structure. Pulmonic valve regurgitation is not visualized. No evidence of pulmonic stenosis. Aorta: The aortic root is normal in size and structure. There is minimal (Grade I) plaque. Venous: The inferior vena cava is normal in size with greater than 50% respiratory variability, suggesting right atrial pressure of 3 mmHg. IAS/Shunts: No atrial level shunt detected by color flow Doppler. Maudine Sos MD Electronically signed by Maudine Sos MD Signature Date/Time: 09/12/2023/11:34:57 AM    Final (Updated)    EP STUDY Result Date: 09/12/2023 See surgical note for result.  ECHOCARDIOGRAM COMPLETE Result Date: 09/11/2023    ECHOCARDIOGRAM REPORT   Patient Name:   Gary Keller Pape Date of Exam: 09/11/2023 Medical Rec #:  [id number]  Height:       62.0 in Accession #:    [id number]          Weight:       182.1 lb Date of Birth:  1950/07/05            BSA:          1.837 m Patient Age:    73 years            BP:           82/53 mmHg Patient Gender: M                   HR:           95 bpm. Exam Location:  Inpatient Procedure: 2D Echo, Cardiac Doppler and Color Doppler (Both Spectral and Color            Flow Doppler were utilized during procedure). Indications:    A-fib with RVR  History:        Patient has no prior history of Echocardiogram examinations.                 CKD, Arrythmias:Atrial Fibrillation; Risk Factors:Former Smoker,                 Sleep Apnea, Hypertension, Dyslipidemia and Diabetes.  Sonographer:    Adelia Homestead RVT RCS Referring Phys: [id number] ARSHAD N [facility] IMPRESSIONS  1. Left ventricular ejection fraction, by estimation, is 60 to 65%. The left ventricle has normal function. The left ventricle has no regional wall motion abnormalities. Left ventricular diastolic function could not be evaluated due to atrial flutter.  2. Right ventricular systolic function is mildly reduced. The right ventricular size is normal. Visually right ventricular wall thickness is above normal limits. There is normal pulmonary artery systolic pressure. The estimated right ventricular systolic pressure is 32.4 mmHg.  3. The mitral valve is normal in structure. Mild to moderate mitral valve regurgitation. No evidence of mitral stenosis.  4. The aortic valve is tricuspid. Aortic valve regurgitation is not visualized. Aortic valve sclerosis is present, with no evidence of aortic valve stenosis. Comparison(s): No prior Echocardiogram.  FINDINGS  Left Ventricle: Left ventricular ejection fraction, by estimation, is 60 to 65%. The left ventricle has normal function. The left ventricle has no regional wall motion abnormalities. The left ventricular internal cavity size was normal in size. There is  borderline left ventricular hypertrophy. Left ventricular diastolic function could not be evaluated due to atrial flutter. Right Ventricle: The right ventricular size is normal. Visually right ventricular wall thickness is above normal limits. Right ventricular systolic function is mildly reduced. There is normal pulmonary artery systolic pressure. The tricuspid regurgitant velocity is 2.71 m/s, and with an assumed right atrial pressure of 3 mmHg, the estimated right ventricular systolic pressure is 32.4 mmHg. Left Atrium: Left atrial size was normal in size. Right Atrium: Right atrial size was normal in size. Pericardium: There is no evidence of pericardial effusion. Mitral Valve: The mitral valve is normal in structure. Mild to moderate mitral valve regurgitation. No evidence of mitral valve stenosis. Tricuspid Valve: The tricuspid valve is normal in structure. Tricuspid valve regurgitation is mild . No evidence of tricuspid stenosis. Aortic Valve: The aortic valve is tricuspid. Aortic valve regurgitation is not visualized. Aortic valve sclerosis is present, with no evidence of aortic valve stenosis. Aortic valve mean gradient measures 2.0 mmHg. Aortic valve peak gradient measures 3.8  mmHg. Aortic valve area, by VTI measures  1.85 cm. Pulmonic Valve: The pulmonic valve was normal in structure. Pulmonic valve regurgitation is mild. No evidence of pulmonic stenosis. Aorta: The aortic root and ascending aorta are structurally normal, with no evidence of dilitation. IAS/Shunts: The atrial septum is grossly normal.  LEFT VENTRICLE PLAX 2D LVIDd:         4.00 cm   Diastology LVIDs:         2.50 cm   LV e' medial:    10.20 cm/s LV PW:         1.20 cm   LV E/e'  medial:  12.2 LV IVS:        1.10 cm   LV e' lateral:   12.20 cm/s LVOT diam:     1.60 cm   LV E/e' lateral: 10.2 LV SV:         30 LV SV Index:   16 LVOT Area:     2.01 cm  RIGHT VENTRICLE            IVC RV Basal diam:  3.00 cm    IVC diam: 1.50 cm RV Mid diam:    2.70 cm RV S prime:     8.19 cm/s LEFT ATRIUM             Index        RIGHT ATRIUM           Index LA diam:        4.10 cm 2.23 cm/m   RA Area:     12.90 cm LA Vol (A2C):   41.7 ml 22.70 ml/m  RA Volume:   28.80 ml  15.68 ml/m LA Vol (A4C):   30.8 ml 16.77 ml/m LA Biplane Vol: 38.0 ml 20.69 ml/m  AORTIC VALVE                    PULMONIC VALVE AV Area (Vmax):    1.85 cm     PV Vmax:       0.63 m/s AV Area (Vmean):   1.75 cm     PV Peak grad:  1.6 mmHg AV Area (VTI):     1.85 cm AV Vmax:           97.70 cm/s AV Vmean:          65.600 cm/s AV VTI:            0.161 m AV Peak Grad:      3.8 mmHg AV Mean Grad:      2.0 mmHg LVOT Vmax:         90.10 cm/s LVOT Vmean:        57.000 cm/s LVOT VTI:          0.148 m LVOT/AV VTI ratio: 0.92  AORTA Ao Root diam: 3.20 cm Ao Asc diam:  3.20 cm MITRAL VALVE                TRICUSPID VALVE MV Area (PHT): 5.56 cm     TR Peak grad:   29.4 mmHg MV Decel Time: 137 msec     TR Vmax:        271.00 cm/s MV E velocity: 124.00 cm/s MV A velocity: 40.80 cm/s   SHUNTS MV E/A ratio:  3.04         Systemic VTI:  0.15 m  Systemic Diam: 1.60 cm Sunit Tolia Electronically signed by Olinda Bertrand Signature Date/Time: 09/11/2023/4:14:22 PM    Final    DG Chest Port 1 View Result Date: 09/10/2023 CLINICAL DATA:  [id number] Chest pain [id number] EXAM: PORTABLE CHEST - 1 VIEW COMPARISON:  08/25/2022. FINDINGS: The heart size and mediastinal contours are within normal limits. There is diffuse pulmonary interstitial prominence which could be seen with atypical infection, edema or interstitial lung disease. There is no focal consolidation. No pneumothorax or pleural effusion. There are thoracic degenerative changes.  IMPRESSION: Nonspecific interstitial prominence consistent with atypical infection, edema or interstitial lung disease. No focal consolidation. Electronically Signed   By: Sydell Eva M.D.   On: 09/10/2023 20:49      Rosena Conradi, MD  [facility] 09/12/2023  If 7PM-7AM, please contact night-coverage

## 2023-09-13 ENCOUNTER — Encounter (HOSPITAL_COMMUNITY): Payer: Self-pay | Admitting: Cardiovascular Disease

## 2023-09-13 DIAGNOSIS — I4892 Unspecified atrial flutter: Secondary | ICD-10-CM | POA: Diagnosis not present

## 2023-09-13 LAB — CBC
HCT: 36 % — ABNORMAL LOW (ref 39.0–52.0)
Hemoglobin: 12.3 g/dL — ABNORMAL LOW (ref 13.0–17.0)
MCH: 31.4 pg (ref 26.0–34.0)
MCHC: 34.2 g/dL (ref 30.0–36.0)
MCV: 91.8 fL (ref 80.0–100.0)
Platelets: 175 10*3/uL (ref 150–400)
RBC: 3.92 MIL/uL — ABNORMAL LOW (ref 4.22–5.81)
RDW: 12.2 % (ref 11.5–15.5)
WBC: 5.6 10*3/uL (ref 4.0–10.5)
nRBC: 0 % (ref 0.0–0.2)

## 2023-09-13 LAB — BASIC METABOLIC PANEL WITH GFR
Anion gap: 8 (ref 5–15)
BUN: 17 mg/dL (ref 8–23)
CO2: 24 mmol/L (ref 22–32)
Calcium: 8.5 mg/dL — ABNORMAL LOW (ref 8.9–10.3)
Chloride: 110 mmol/L (ref 98–111)
Creatinine, Ser: 1.19 mg/dL (ref 0.61–1.24)
GFR, Estimated: >60 mL/min (ref >60)
Glucose, Bld: 88 mg/dL (ref 70–99)
Potassium: 3.6 mmol/L (ref 3.5–5.1)
Sodium: 142 mmol/L (ref 135–145)

## 2023-09-13 LAB — GLUCOSE, CAPILLARY: Glucose-Capillary: 128 mg/dL — ABNORMAL HIGH (ref 70–99)

## 2023-09-13 LAB — MAGNESIUM: Magnesium: 2.1 mg/dL (ref 1.7–2.4)

## 2023-09-13 MED ORDER — APIXABAN 5 MG PO TABS: 5.0000 mg | ORAL_TABLET | Freq: Two times a day (BID) | ORAL | 2 refills | Status: DC

## 2023-09-13 MED ORDER — POTASSIUM CHLORIDE CRYS ER 20 MEQ PO TBCR
40.0000 meq | EXTENDED_RELEASE_TABLET | Freq: Once | ORAL | Status: AC
  Administered 2023-09-13: 40 meq via ORAL
  Filled 2023-09-13: qty 2

## 2023-09-13 MED ORDER — ASPIRIN 81 MG PO TBEC: 81.0000 mg | DELAYED_RELEASE_TABLET | Freq: Every day | ORAL | 0 refills | Status: DC

## 2023-09-13 NOTE — Progress Notes (Addendum)
 Advanced Heart Failure Rounding Note  Cardiologist: Alexandria Angel, MD  Chief Complaint: Hypotension   Patient Profile:     73 y/o male w/ h/o CAD, pulmonary fibrosis followed by Dr. Villa Greaser, chronic hypoxic respiratory failure on home O2 (2L/min baseline), OSA on CPAP, HTN and HLD, who was admitted for new onset atrial flutter w/ RVR. Transferred to CCU for persistent hypotension post TEE/DCCV requiring vasopressor support. Required NEO gtt.   Subjective:    Off NEO, since 1200 yesterday. BP remains stable/normotensive. SBPs now in 130s.   He is maintaining NSR. HR 90s.   Labs stable, SCr 1.19   OOB, sitting up in chair. Feels well. No complaints. Denies CP. No dyspnea.    Objective:   Weight Range: 82.6 kg Body mass index is 33.31 kg/m.   Vital Signs:   Temp:  [97.6 F (36.4 C)-98.3 F (36.8 C)] 98.1 F (36.7 C) (04/30 0630) Pulse Rate:  [75-169] 96 (04/30 0700) Resp:  [11-30] 23 (04/30 0700) BP: (82-137)/(52-86) 120/67 (04/30 0700) SpO2:  [88 %-97 %] 96 % (04/30 0700) Last BM Date : 09/10/23  Weight change: Filed Weights   09/11/23 0245  Weight: 82.6 kg    Intake/Output:   Intake/Output Summary (Last 24 hours) at 09/13/2023 0758 Last data filed at 09/13/2023 0400 Gross per 24 hour  Intake 895.86 ml  Output 1100 ml  Net -204.14 ml      Physical Exam    General:  Well appearing. No resp difficulty HEENT: Normal Neck: Supple. JVP not elevated Cor: PMI nondisplaced. Regular rate & rhythm.  Lungs: Clear Abdomen: Soft, nontender, nondistended.  Extremities: No cyanosis, clubbing, rash, edema Neuro: Alert & orientedx3, cranial nerves grossly intact. moves all 4 extremities w/o difficulty. Affect pleasant   Telemetry   NSR 90s, personally reviewed   EKG    N/A   Labs    CBC Recent Labs    09/11/23 0826 09/12/23 0241 09/13/23 0317  WBC 9.6 8.7 5.6  NEUTROABS 5.8  --   --   HGB 13.5 13.5 12.3*  HCT 38.8* 39.0 36.0*  MCV 90.9 90.9 91.8   PLT 205 218 175   Basic Metabolic Panel Recent Labs    14/78/29 0241 09/13/23 0317  NA 139 142  K 3.7 3.6  CL 105 110  CO2 24 24  GLUCOSE 109* 88  BUN 20 17  CREATININE 1.38* 1.19  CALCIUM  8.6* 8.5*  MG 1.8 2.1   Liver Function Tests Recent Labs    09/11/23 0826  AST 20  ALT 16  ALKPHOS 71  BILITOT 0.7  PROT 5.8*  ALBUMIN 3.2*   No results for input(s): "LIPASE", "AMYLASE" in the last 72 hours. Cardiac Enzymes No results for input(s): "CKTOTAL", "CKMB", "CKMBINDEX", "TROPONINI" in the last 72 hours.  BNP: BNP (last 3 results) No results for input(s): "BNP" in the last 8760 hours.  ProBNP (last 3 results) No results for input(s): "PROBNP" in the last 8760 hours.   D-Dimer No results for input(s): "DDIMER" in the last 72 hours. Hemoglobin A1C No results for input(s): "HGBA1C" in the last 72 hours. Fasting Lipid Panel No results for input(s): "CHOL", "HDL", "LDLCALC", "TRIG", "CHOLHDL", "LDLDIRECT" in the last 72 hours. Thyroid Function Tests Recent Labs    09/11/23 0826  TSH 3.253    Other results:   Imaging    ECHO TEE Result Date: 09/12/2023    TRANSESOPHOGEAL ECHO REPORT   Patient Name:   Jefrey DALE SR Amy Date of Exam:  09/12/2023 Medical Rec #:  [id number]           Height:       62.0 in Accession #:    [id number]          Weight:       182.1 lb Date of Birth:  1950-10-21            BSA:          1.837 m Patient Age:    73 years            BP:           106/90 mmHg Patient Gender: M                   HR:           165 bpm. Exam Location:  Inpatient Procedure: Transesophageal Echo, Color Doppler and Cardiac Doppler (Both            Spectral and Color Flow Doppler were utilized during procedure). Indications:     Cardioversion  History:         Patient has prior history of Echocardiogram examinations, most                  recent 09/11/2023.  Sonographer:     Hersey Lorenzo RDCS Referring Phys:  [id number] HAO MENG Diagnosing Phys: Maudine Sos MD  PROCEDURE: The transesophogeal probe was passed without difficulty through the esophogus of the patient. Sedation performed by different physician. Image quality was good. The patient's vital signs; including heart rate, blood pressure, and oxygen saturation; remained stable throughout the procedure. The patient developed hypotension during the procedure. A successful direct current cardioversion was performed at 200 joules with 1 attempt.  IMPRESSIONS  1. Patient was tachycardic in the 160s throughout the procedure.  2. Left ventricular ejection fraction, by estimation, is 55 to 60%. The left ventricle has normal function. The left ventricle has no regional wall motion abnormalities.  3. Right ventricular systolic function is mildly reduced. The right ventricular size is normal.  4. No left atrial/left atrial appendage thrombus was detected. The LAA emptying velocity was 102 cm/s.  5. The mitral valve is normal in structure. Trivial mitral valve regurgitation. No evidence of mitral stenosis.  6. The aortic valve is tricuspid. Aortic valve regurgitation is not visualized. No aortic stenosis is present.  7. The inferior vena cava is normal in size with greater than 50% respiratory variability, suggesting right atrial pressure of 3 mmHg. Conclusion(s)/Recommendation(s): No LA/LAA thrombus identified. Successful cardioversion performed with restoration of normal sinus rhythm. FINDINGS  Left Ventricle: Left ventricular ejection fraction, by estimation, is 55 to 60%. The left ventricle has normal function. The left ventricle has no regional wall motion abnormalities. The left ventricular internal cavity size was normal in size. There is  no left ventricular hypertrophy. Right Ventricle: The right ventricular size is normal. No increase in right ventricular wall thickness. Right ventricular systolic function is mildly reduced. Left Atrium: Left atrial size was normal in size. No left atrial/left atrial appendage thrombus  was detected. The LAA emptying velocity was 102 cm/s. Right Atrium: Right atrial size was normal in size. Pericardium: There is no evidence of pericardial effusion. Mitral Valve: The mitral valve is normal in structure. Trivial mitral valve regurgitation. No evidence of mitral valve stenosis. Tricuspid Valve: The tricuspid valve is normal in structure. Tricuspid valve regurgitation is trivial. No evidence of tricuspid stenosis. Aortic Valve: The aortic valve is tricuspid. Aortic  valve regurgitation is not visualized. No aortic stenosis is present. Pulmonic Valve: The pulmonic valve was normal in structure. Pulmonic valve regurgitation is not visualized. No evidence of pulmonic stenosis. Aorta: The aortic root is normal in size and structure. There is minimal (Grade I) plaque. Venous: The inferior vena cava is normal in size with greater than 50% respiratory variability, suggesting right atrial pressure of 3 mmHg. IAS/Shunts: No atrial level shunt detected by color flow Doppler. Maudine Sos MD Electronically signed by Maudine Sos MD Signature Date/Time: 09/12/2023/11:34:57 AM    Final (Updated)    EP STUDY Result Date: 09/12/2023 See surgical note for result.    Medications:     Scheduled Medications:  apixaban  5 mg Oral BID   atorvastatin   80 mg Oral Daily   Chlorhexidine Gluconate Cloth  6 each Topical Daily   fenofibrate  160 mg Oral Daily   icosapent  Ethyl  2 g Oral BID   insulin aspart  0-9 Units Subcutaneous TID WC   metoprolol  succinate  50 mg Oral Daily   pantoprazole  40 mg Oral Daily   Pirfenidone   801 mg Oral TID   predniSONE   5 mg Oral Once per day on Sunday Tuesday Thursday Saturday   venlafaxine XR  75 mg Oral Q breakfast    Infusions:  sodium chloride Stopped (09/12/23 1654)   phenylephrine (NEO-SYNEPHRINE) Adult infusion Stopped (09/12/23 1227)    PRN Medications:    Assessment/Plan   1. Peri procedural Hypotension  - persistently hypotensive post  TEE/DCCV - suspect 2/2 sedation in setting of baseline low BP  - BP stabilized w/ Midodrine and NEO gtt, now off. BP stable/normotensive   2. Aflutter w/ RVR - new onset, TSH nl. HS trop low level and flat/ not c/w ACS. Echo w/ normal LVEF, LA size normal, 4.1 cm - occurrence in the letting of underlying pulmonary disease (pulm fibrosis) and OSA - s/p successful TEE/DCCV, maintaining NSR - Toprol  XL 50 mg for rate control - no amio w/ pulmonary fibrosis  - recommend outpatient referral to EP for possible AFL ablation vs consideration of another AAD  - Eliquis for a/c     3. Chronic Hypoxic Respiratory Failure - 2/2 pulm fibrosis, followed by Dr. Villa Greaser. Stable   - on 2L [place] chronically, stable     4. OSA - continue CPAP at bedtime    5. CAD - remote PCI - denies CP - continue medical therapy    6. 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   Cardiac Meds for Discharge ASA 81 mg daily (stop Plavix) Eliquis 5 mg bid Vascepa  2 g bid Fenofibrate 160 mg daily  Toprol  XL 50 mg daily  Amlodipine  5 mg daily   Will arrange post hospital f/u w/ Dr. Christel Cousins. Continue to hold losartan  until clinic f/u.      Length of Stay: 2  Ruddy Corral, PA-C  09/13/2023, 7:58 AM  Advanced Heart Failure Team Pager [phone] (M-F; 7a - 5p)  Please contact [facility] for night-coverage after hours (5p -7a ) and weekends on [email or web address]

## 2023-09-13 NOTE — Plan of Care (Signed)

## 2023-09-13 NOTE — Discharge Summary (Signed)
 Physician Discharge Summary  Gary Keller JYN:[id number] DOB: Sep 14, 1950 DOA: 09/10/2023  PCP: Medicine, [facility]  Admit date: 09/10/2023 Discharge date: 09/13/2023  Admitted From: Home  Discharge disposition: Home   Recommendations for Outpatient Follow-Up:   Follow up with your primary care provider in one week.  Check CBC, BMP, magnesium in the next visit Follow-up with cardiology as scheduled by the clinic.  Discharge Diagnosis:   Principal Problem:   Atrial flutter with rapid ventricular response (HCC) Active Problems:   Essential hypertension   CAD S/P PCI 2010   Non-insulin dependent type 2 diabetes mellitus (HCC)   HTN (hypertension)   Dyslipidemia, goal LDL below 70   ILD (interstitial lung disease) (HCC)   OSA (obstructive sleep apnea)   Chronic kidney disease (CKD) stage G3b/A2, moderately decreased glomerular filtration rate (GFR) between 30-44 mL/min/1.73 square meter and albuminuria creatinine ratio between 30-299 mg/g (HCC)   Atrial fibrillation with rapid ventricular response (HCC)   Discharge Condition: Improved.  Diet recommendation: Low sodium, heart healthy.  Wound care: None.  Code status: Full.   History of Present Illness:   Gary Keller is a 73 y.o. male with history of CAD status post PCI, interstitial lung disease, sleep apnea, hypertension, chronic kidney disease stage III, diabetes mellitus type 2, depression presented hospital with persistent palpitation for 48 hours even at rest with some chest pressure.  In the ED patient was noted to be in atrial flutter with RVR and was started on Cardizem infusion.  Labs showed creatinine elevation at 1.6.  Troponins were 127 followed by 108.  Cardiology was consulted and patient was admitted hospital for further evaluation and treatment.    Hospital Course:   Following conditions were addressed during hospitalization as listed below,  Atrial flutter with RVR   Failed treatment with Cardizem drip.  Cardiology followed the patient and patient underwent DC cardioversion on 09/12/2023.  Subsequently after cardioversion patient was hypotensive requiring IV fluids, albumin and Neo-Synephrine was transitioned to ICU care.  This  improved rapidly.  CHADS2 Vasc score of at least 4.   Will continue Eliquis.  Communicated with cardiology and plan is to continue aspirin and Eliquis.  No Plavix and losartan  until next cardiology visit.  TSH of 3.2.  2D echocardiogram showed LV ejection fraction of 55 to 60%.  Cardiology to follow the patient as outpatient.  CAD status post PCI in 2010.   Troponins flat, continue beta-blockers and aspirin.  Cardiology on board.   Diabetes mellitus type 2 patient states he takes Mounjaro.  Last hemoglobin A1c was 5.2 as per the notes in epic.   Hypertension on beta-blockers and losartan  at home.  Currently on metoprolol .  Hold losartan  until cardiology visit.   Interstitial lung disease on pirfenidone  and prednisone .  Follows up with Dr. Villa Greaser pulmonary.  Chronically on 5 mg of prednisone .  Takes 3 L of oxygen at baseline.   OSA on CPAP at bedtime.  Class I obesity. Body mass index is 33.31 kg/m.  Would benefit from  weight loss as outpatient.  Continue lifestyle modification.   Chronic kidney disease stage IIIa creatinine at around baseline.  Creatinine of 1.1 prior to discharge.  Disposition.  At this time, patient is stable for disposition home with outpatient PCP and cardiology follow-up.  Medical Consultants:   Cardiology  Procedures:    DC cardioversion 09/12/2023 Subjective:   Today, patient was seen and examined at bedside.  Sitting up in the bedside  chair.  Denies any chest pain, shortness of breath, dizziness, lightheadedness.  Family at bedside.  Feels at his baseline.  Discharge Exam:   Vitals:   09/13/23 1000 09/13/23 1100  BP: 119/81   Pulse: 93 (!) 105  Resp: 19 (!) 24  Temp:    SpO2: 98% (!) 88%    Vitals:   09/13/23 0900 09/13/23 0907 09/13/23 1000 09/13/23 1100  BP: 137/80 137/80 119/81   Pulse: 86 94 93 (!) 105  Resp: (!) 25  19 (!) 24  Temp:      TempSrc:      SpO2: 95%  98% (!) 88%  Weight:      Height:       Body mass index is 33.31 kg/m.   General: Alert awake, not in obvious distress, on nasal cannula oxygen, obese built HENT: pupils equally reacting to light,  No scleral pallor or icterus noted. Oral mucosa is moist.  Chest:  Clear breath sounds.  No crackles or wheezes.  CVS: S1 &S2 heard. No murmur.  Regular rate and rhythm. Abdomen: Soft, nontender, nondistended.  Bowel sounds are heard.   Extremities: No cyanosis, clubbing or edema.  Peripheral pulses are palpable. Psych: Alert, awake and oriented, normal mood CNS:  No cranial nerve deficits.  Power equal in all extremities.   Skin: Warm and dry.  No rashes noted.  The results of significant diagnostics from this hospitalization (including imaging, microbiology, ancillary and laboratory) are listed below for reference.     Diagnostic Studies:   ECHOCARDIOGRAM COMPLETE Result Date: 09/11/2023    ECHOCARDIOGRAM REPORT   Patient Name:   Gary Keller Date of Exam: 09/11/2023 Medical Rec #:  [id number]           Height:       62.0 in Accession #:    [id number]          Weight:       182.1 lb Date of Birth:  02/25/1951            BSA:          1.837 m Patient Age:    73 years            BP:           82/53 mmHg Patient Gender: M                   HR:           95 bpm. Exam Location:  Inpatient Procedure: 2D Echo, Cardiac Doppler and Color Doppler (Both Spectral and Color            Flow Doppler were utilized during procedure). Indications:    A-fib with RVR  History:        Patient has no prior history of Echocardiogram examinations.                 CKD, Arrythmias:Atrial Fibrillation; Risk Factors:Former Smoker,                 Sleep Apnea, Hypertension, Dyslipidemia and Diabetes.  Sonographer:    Gary Keller RVT  RCS Referring Phys: [id number] Gary Keller IMPRESSIONS  1. Left ventricular ejection fraction, by estimation, is 60 to 65%. The left ventricle has normal function. The left ventricle has no regional wall motion abnormalities. Left ventricular diastolic function could not be evaluated due to atrial flutter.  2. Right ventricular systolic function is mildly reduced. The right ventricular size is normal.  Visually right ventricular wall thickness is above normal limits. There is normal pulmonary artery systolic pressure. The estimated right ventricular systolic pressure is 32.4 mmHg.  3. The mitral valve is normal in structure. Mild to moderate mitral valve regurgitation. No evidence of mitral stenosis.  4. The aortic valve is tricuspid. Aortic valve regurgitation is not visualized. Aortic valve sclerosis is present, with no evidence of aortic valve stenosis. Comparison(s): No prior Echocardiogram. FINDINGS  Left Ventricle: Left ventricular ejection fraction, by estimation, is 60 to 65%. The left ventricle has normal function. The left ventricle has no regional wall motion abnormalities. The left ventricular internal cavity size was normal in size. There is  borderline left ventricular hypertrophy. Left ventricular diastolic function could not be evaluated due to atrial flutter. Right Ventricle: The right ventricular size is normal. Visually right ventricular wall thickness is above normal limits. Right ventricular systolic function is mildly reduced. There is normal pulmonary artery systolic pressure. The tricuspid regurgitant velocity is 2.71 m/s, and with an assumed right atrial pressure of 3 mmHg, the estimated right ventricular systolic pressure is 32.4 mmHg. Left Atrium: Left atrial size was normal in size. Right Atrium: Right atrial size was normal in size. Pericardium: There is no evidence of pericardial effusion. Mitral Valve: The mitral valve is normal in structure. Mild to moderate mitral valve  regurgitation. No evidence of mitral valve stenosis. Tricuspid Valve: The tricuspid valve is normal in structure. Tricuspid valve regurgitation is mild . No evidence of tricuspid stenosis. Aortic Valve: The aortic valve is tricuspid. Aortic valve regurgitation is not visualized. Aortic valve sclerosis is present, with no evidence of aortic valve stenosis. Aortic valve mean gradient measures 2.0 mmHg. Aortic valve peak gradient measures 3.8  mmHg. Aortic valve area, by VTI measures 1.85 cm. Pulmonic Valve: The pulmonic valve was normal in structure. Pulmonic valve regurgitation is mild. No evidence of pulmonic stenosis. Aorta: The aortic root and ascending aorta are structurally normal, with no evidence of dilitation. IAS/Shunts: The atrial septum is grossly normal.  LEFT VENTRICLE PLAX 2D LVIDd:         4.00 cm   Diastology LVIDs:         2.50 cm   LV e' medial:    10.20 cm/s LV PW:         1.20 cm   LV E/e' medial:  12.2 LV IVS:        1.10 cm   LV e' lateral:   12.20 cm/s LVOT diam:     1.60 cm   LV E/e' lateral: 10.2 LV SV:         30 LV SV Index:   16 LVOT Area:     2.01 cm  RIGHT VENTRICLE            IVC RV Basal diam:  3.00 cm    IVC diam: 1.50 cm RV Mid diam:    2.70 cm RV S prime:     8.19 cm/s LEFT ATRIUM             Index        RIGHT ATRIUM           Index LA diam:        4.10 cm 2.23 cm/m   RA Area:     12.90 cm LA Vol (A2C):   41.7 ml 22.70 ml/m  RA Volume:   28.80 ml  15.68 ml/m LA Vol (A4C):   30.8 ml 16.77 ml/m LA Biplane Vol: 38.0 ml  20.69 ml/m  AORTIC VALVE                    PULMONIC VALVE AV Area (Vmax):    1.85 cm     PV Vmax:       0.63 m/s AV Area (Vmean):   1.75 cm     PV Peak grad:  1.6 mmHg AV Area (VTI):     1.85 cm AV Vmax:           97.70 cm/s AV Vmean:          65.600 cm/s AV VTI:            0.161 m AV Peak Grad:      3.8 mmHg AV Mean Grad:      2.0 mmHg LVOT Vmax:         90.10 cm/s LVOT Vmean:        57.000 cm/s LVOT VTI:          0.148 m LVOT/AV VTI ratio: 0.92  AORTA Ao  Root diam: 3.20 cm Ao Asc diam:  3.20 cm MITRAL VALVE                TRICUSPID VALVE MV Area (PHT): 5.56 cm     TR Peak grad:   29.4 mmHg MV Decel Time: 137 msec     TR Vmax:        271.00 cm/s MV E velocity: 124.00 cm/s MV A velocity: 40.80 cm/s   SHUNTS MV E/A ratio:  3.04         Systemic VTI:  0.15 m                             Systemic Diam: 1.60 cm Sunit Tolia Electronically signed by Olinda Bertrand Signature Date/Time: 09/11/2023/4:14:22 PM    Final    DG Chest Port 1 View Result Date: 09/10/2023 CLINICAL DATA:  [id number] Chest pain [id number] EXAM: PORTABLE CHEST - 1 VIEW COMPARISON:  08/25/2022. FINDINGS: The heart size and mediastinal contours are within normal limits. There is diffuse pulmonary interstitial prominence which could be seen with atypical infection, edema or interstitial lung disease. There is no focal consolidation. No pneumothorax or pleural effusion. There are thoracic degenerative changes. IMPRESSION: Nonspecific interstitial prominence consistent with atypical infection, edema or interstitial lung disease. No focal consolidation. Electronically Signed   By: Sydell Eva M.D.   On: 09/10/2023 20:49     Labs:   Basic Metabolic Panel: Recent Labs  Lab 09/10/23 1936 09/11/23 0826 09/12/23 0241 09/13/23 0317  NA 141 139 139 142  K 4.4 3.7 3.7 3.6  CL 103 110 105 110  CO2 27 23 24 24   GLUCOSE 126* 94 109* 88  BUN 28* 20 20 17   CREATININE 1.68* 1.22 1.38* 1.19  CALCIUM  9.3 8.4* 8.6* 8.5*  MG  --   --  1.8 2.1   GFR Estimated Creatinine Clearance: 50.7 mL/min (by C-G formula based on SCr of 1.19 mg/dL). Liver Function Tests: Recent Labs  Lab 09/11/23 0826  AST 20  ALT 16  ALKPHOS 71  BILITOT 0.7  PROT 5.8*  ALBUMIN 3.2*   No results for input(s): "LIPASE", "AMYLASE" in the last 168 hours. No results for input(s): "AMMONIA" in the last 168 hours. Coagulation profile No results for input(s): "INR", "PROTIME" in the last 168 hours.  CBC: Recent Labs  Lab  09/10/23 1936 09/11/23 6213 09/12/23 0241 09/13/23 0865  WBC 9.8 9.6 8.7 5.6  NEUTROABS  --  5.8  --   --   HGB 14.7 13.5 13.5 12.3*  HCT 41.7 38.8* 39.0 36.0*  MCV 90.3 90.9 90.9 91.8  PLT 247 205 218 175   Cardiac Enzymes: No results for input(s): "CKTOTAL", "CKMB", "CKMBINDEX", "TROPONINI" in the last 168 hours. BNP: Invalid input(s): "POCBNP" CBG: Recent Labs  Lab 09/12/23 0553 09/12/23 1103 09/12/23 1551 09/12/23 2253 09/13/23 0812  GLUCAP 93 116* 125* 93 128*   D-Dimer No results for input(s): "DDIMER" in the last 72 hours. Hgb A1c No results for input(s): "HGBA1C" in the last 72 hours. Lipid Profile No results for input(s): "CHOL", "HDL", "LDLCALC", "TRIG", "CHOLHDL", "LDLDIRECT" in the last 72 hours. Thyroid function studies Recent Labs    09/11/23 0826  TSH 3.253   Anemia work up No results for input(s): "VITAMINB12", "FOLATE", "FERRITIN", "TIBC", "IRON", "RETICCTPCT" in the last 72 hours. Microbiology Recent Results (from the past 240 hours)  Surgical pcr screen     Status: None   Collection Time: 09/11/23  2:53 AM   Specimen: Nasal Mucosa; Nasal Swab  Result Value Ref Range Status   MRSA, PCR NEGATIVE NEGATIVE Final   Staphylococcus aureus NEGATIVE NEGATIVE Final    Comment: (NOTE) The Xpert SA Assay (FDA approved for NASAL specimens in patients 69 years of age and older), is one component of a comprehensive surveillance program. It is not intended to diagnose infection nor to guide or monitor treatment. Performed at [facility], [address] [place], [place] [address]   MRSA Next Gen by PCR, Nasal     Status: None   Collection Time: 09/12/23  1:05 PM   Specimen: Nasal Mucosa; Nasal Swab  Result Value Ref Range Status   MRSA by PCR Next Gen NOT DETECTED NOT DETECTED Final    Comment: (NOTE) The GeneXpert MRSA Assay (FDA approved for NASAL specimens only), is one component of a comprehensive MRSA colonization surveillance program. It  is not intended to diagnose MRSA infection nor to guide or monitor treatment for MRSA infections. Test performance is not FDA approved in patients less than 50 years old. Performed at [facility], [address] [place], [place] [address]      Discharge Instructions:   Discharge Instructions     Call MD for:  temperature >100.4   Complete by: As directed    Diet - low sodium heart healthy   Complete by: As directed    Discharge instructions   Complete by: As directed    Follow-up with your primary care provider in 1 week.  Check blood work at that time.  Follow-up with cardiology as outpatient as scheduled by the clinic.  Do not take losartan  until the next cardiology visit.  You have been prescribed aspirin, do not take clopidogrel.  Take medications as prescribed including blood thinners.  Take precautions while on blood thinners.  Seek medical attention for worsening symptoms.   Increase activity slowly   Complete by: As directed       Allergies as of 09/13/2023   No Known Allergies      Medication List     STOP taking these medications    acetaminophen 325 MG tablet Commonly known as: TYLENOL   albuterol  108 (90 Base) MCG/ACT inhaler Commonly known as: VENTOLIN  HFA   clopidogrel 75 MG tablet Commonly known as: PLAVIX   famotidine 40 MG tablet Commonly known as: PEPCID   losartan  100 MG tablet  Commonly known as: COZAAR        TAKE these medications    amLODipine  5 MG tablet Commonly known as: NORVASC  TAKE 1 TABLET(5 MG) BY MOUTH DAILY   apixaban 5 MG Tabs tablet Commonly known as: ELIQUIS Take 1 tablet (5 mg total) by mouth 2 (two) times daily.   aspirin EC 81 MG tablet Take 1 tablet (81 mg total) by mouth daily. Swallow whole.   atorvastatin  80 MG tablet Commonly known as: LIPITOR TAKE 1 TABLET(80 MG) BY MOUTH DAILY   fenofibrate 160 MG tablet Take 160 mg by mouth daily.   metoprolol  succinate 50 MG 24 hr tablet Commonly known as:  TOPROL -XL Take 50 mg by mouth daily.   Mounjaro 10 MG/0.5ML Pen Generic drug: tirzepatide Inject 10 mg into the skin once a week.   omeprazole 40 MG capsule Commonly known as: PRILOSEC Take 40 mg by mouth daily.   Pirfenidone  267 MG Caps Commonly known as: Esbriet  Take 801 mg by mouth in the morning, at noon, and at bedtime.   predniSONE  5 MG tablet Commonly known as: DELTASONE  Take Monday, Wednesday and friday What changed: additional instructions   Vascepa  1 g capsule Generic drug: icosapent  Ethyl Take 2 capsules (2 g total) by mouth 2 (two) times daily.   venlafaxine XR 75 MG 24 hr capsule Commonly known as: EFFEXOR-XR Take 75 mg by mouth daily with breakfast.        Follow-up Information     Medicine, [facility] Follow up in 1 week(s).   Specialty: Family Medicine                 Time coordinating discharge: 39 minutes  Signed:  Azlynn Mitnick  [facility] 09/13/2023, 11:51 AM

## 2023-09-13 NOTE — Anesthesia Postprocedure Evaluation (Signed)
 Anesthesia Post Note  Patient: Gary Keller Sr [facility]  Procedure(s) Performed: TRANSESOPHAGEAL ECHOCARDIOGRAM CARDIOVERSION     Patient location during evaluation: Cath Lab Anesthesia Type: MAC Level of consciousness: awake and alert Pain management: pain level controlled Vital Signs Assessment: post-procedure vital signs reviewed and stable Respiratory status: spontaneous breathing, nonlabored ventilation and respiratory function stable Cardiovascular status: stable and blood pressure returned to baseline Postop Assessment: no apparent nausea or vomiting Anesthetic complications: no   No notable events documented.                 Larhonda Dettloff

## 2023-10-01 NOTE — Progress Notes (Signed)
 Cardiology Clinic Note   Patient Name: Gary Keller Date of Encounter: 10/03/2023  Primary Care Provider:  Medicine, [facility] Cardiologist:  Alexandria Angel, MD  Patient Profile    Gary Keller Gary Keller 73 year old male presents to the clinic today for follow-up evaluation of his paroxysmal atrial fibrillation.   Past Medical History    Past Medical History:  Diagnosis Date   CAD (coronary artery disease)    Depression    Diabetes mellitus (HCC)    Borderline   Encounter for screening for malignant neoplasm of prostate    HTN (hypertension)    Hyperlipidemia    Non morbid obesity due to excess calories    Renal insufficiency    Rhinitis, allergic    Past Surgical History:  Procedure Laterality Date   APPENDECTOMY     CARDIOVERSION N/A 09/12/2023   Procedure: CARDIOVERSION;  Surgeon: Maudine Sos, MD;  Location: [facility] INVASIVE CV LAB;  Service: Cardiovascular;  Laterality: N/A;   COLONOSCOPY WITH ESOPHAGOGASTRODUODENOSCOPY (EGD)     LEFT HEART CATH  2006   Stent x3   TRANSESOPHAGEAL ECHOCARDIOGRAM (CATH LAB) N/A 09/12/2023   Procedure: TRANSESOPHAGEAL ECHOCARDIOGRAM;  Surgeon: Maudine Sos, MD;  Location: [facility] INVASIVE CV LAB;  Service: Cardiovascular;  Laterality: N/A;    Allergies  No Known Allergies  History of Present Illness    Gary Keller has a PMH of atrial flutter with RVR, HTN, coronary artery disease status post PCI in 2010, type 2 diabetes, HTN, interstitial lung disease, HLD, OSA, CKD stage III, and atrial fibrillation with RVR.  He presented to the emergency department on 09/11/2023 and was discharged on 09/13/2023.  He presented with palpitations that had been present for the prior 48 hours.  He noted some chest pressure.  In the emergency department he was noted to have atrial flutter with RVR.  He was started on Cardizem  gtt.  His creatinine was elevated at 1.6.  His cardiac troponins were noted to be 127  and then 108.  Cardiology was consulted.  He underwent DCCV on 09/12/2023.  After conversion he became hypotensive.  He required IV fluids.  He required albumin  and Neo-Synephrine.  He was transitioned to ICU.  He improved rapidly.  His CHA2DS2-VASc score was noted to be 4.  He was continued on apixaban .  He was also continued on aspirin .  His Plavix and losartan  were held until follow-up with outpatient cardiology.  His echocardiogram showed LVEF of 55 to 60%.  He presents to the clinic today for follow-up evaluation and states he is doing well.  He has not had any further episodes of fast or irregular heartbeat since his cardioversion.  We reviewed his hospitalization.  He expressed understanding.  His EKG today shows sinus rhythm.  He reports compliance with his apixaban .  He denies bleeding issues.  We reviewed his echocardiogram.  He continues to use 3 L of oxygen via nasal cannula.  I will continue his current medication regimen, have him maintain his p.o. hydration, avoid triggers for a flutter and plan follow-up in 3 to 4 months.  Today he denies chest pain, increased shortness of breath, lower extremity edema, fatigue, palpitations, melena, hematuria, hemoptysis, diaphoresis, weakness, presyncope, syncope, orthopnea, and PND.    Home Medications    Prior to Admission medications   Medication Sig Start Date End Date Taking? Authorizing Provider  amLODipine  (NORVASC ) 5 MG tablet TAKE 1 TABLET(5 MG) BY MOUTH DAILY 02/22/21   Alexandria Angel  S, MD  apixaban  (ELIQUIS ) 5 MG TABS tablet Take 1 tablet (5 mg total) by mouth 2 (two) times daily. 09/13/23   Pokhrel, Amador Bad, MD  aspirin  EC 81 MG tablet Take 1 tablet (81 mg total) by mouth daily. Swallow whole. 09/13/23 09/12/24  Pokhrel, Laxman, MD  atorvastatin  (LIPITOR) 80 MG tablet TAKE 1 TABLET(80 MG) BY MOUTH DAILY 02/03/23   Lenise Quince, MD  fenofibrate  160 MG tablet Take 160 mg by mouth daily.  01/11/13   [provider]  metoprolol   succinate (TOPROL -XL) 50 MG 24 hr tablet Take 50 mg by mouth daily.    [provider]  MOUNJARO 10 MG/0.5ML Pen Inject 10 mg into the skin once a week.    [provider]  omeprazole (PRILOSEC) 40 MG capsule Take 40 mg by mouth daily. 04/29/23   [provider]  Pirfenidone  (ESBRIET ) 267 MG CAPS Take 801 mg by mouth in the morning, at noon, and at bedtime. 08/28/23   Lind Repine, MD  predniSONE  (DELTASONE ) 5 MG tablet Take Monday, Wednesday and friday Patient taking differently: Takes TTSS - skip MWF 02/06/23   Lind Repine, MD  VASCEPA  1 g capsule Take 2 capsules (2 g total) by mouth 2 (two) times daily. 08/16/23   Clearnce Curia, NP  venlafaxine  XR (EFFEXOR -XR) 75 MG 24 hr capsule Take 75 mg by mouth daily with breakfast.  09/03/09   [provider]    Family History    Family History  Problem Relation Age of Onset   Diabetes Brother    Heart disease Brother    Heart disease Father    Hypertension Brother    Stroke Mother    Stroke Brother    Lung disease Neg Hx    Rheumatologic disease Neg Hx    He indicated that the status of his mother is unknown. He indicated that the status of his father is unknown. He indicated that the status of his neg hx is unknown.  Social History    Social History   Socioeconomic History   Marital status: Married    Spouse name: Not on file   Number of children: 3   Years of education: Not on file   Highest education level: Not on file  Occupational History    Comment: Retired  Tobacco Use   Smoking status: Former    Current packs/day: 0.00    Average packs/day: 1 pack/day for 43.0 years (43.0 ttl pk-yrs)    Types: Cigarettes    Start date: 29    Quit date: 05/16/2001    Years since quitting: 22.3   Smokeless tobacco: Never  Vaping Use   Vaping status: Never Used  Substance and Sexual Activity   Alcohol use: Yes    Alcohol/week: 0.0 standard drinks of alcohol    Comment: Occasional   Drug use: No    Sexual activity: Not on file  Other Topics Concern   Not on file  Social History Narrative   [facility] (05/27/16):   Originally from [facility]. Has always lived in [place]. 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 Does have exposure to chemicals from spraying. No mold exposure. Remote cockatiel exposure in a previous home. No hot tub exposure. Enjoys watching his grandchildren and hunting.    Social Drivers of Corporate investment banker Strain: Low Risk  (09/23/2023)   Received from [facility]   Overall Financial Resource Strain (CARDIA)    Difficulty  of Paying Living Expenses: Not hard at all  Food Insecurity: No Food Insecurity (09/23/2023)   Received from [facility]   Hunger Vital Sign    Worried About Running Out of Food in the Last Year: Never true    Ran Out of Food in the Last Year: Never true  Transportation Needs: No Transportation Needs (09/23/2023)   Received from [facility] - Transportation    Lack of Transportation (Medical): No    Lack of Transportation (Non-Medical): No  Physical Activity: Unknown (09/23/2023)   Received from [facility]   Exercise Vital Sign    Days of Exercise per Week: 0 days    Minutes of Exercise per Session: Not on file  Stress: No Stress Concern Present (09/23/2023)   Received from [facility] - Occupational Stress Questionnaire    Feeling of Stress : Not at all  Social Connections: Moderately Integrated (09/23/2023)   Received from [facility]   Social Network    How would you rate your social network (family, work, friends)?: Adequate participation with social networks  Intimate Partner Violence: Not At Risk (09/23/2023)   Received from [facility]   HITS    Over the last 12 months how often did your partner physically hurt you?: Never    Over the last 12 months how often did your partner insult you or talk down to you?: Never    Over the last  12 months how often did your partner threaten you with physical harm?: Never    Over the last 12 months how often did your partner scream or curse at you?: Never     Review of Systems    General:  No chills, fever, night sweats or weight changes.  Cardiovascular:  No chest pain, dyspnea on exertion, edema, orthopnea, palpitations, paroxysmal nocturnal dyspnea. Dermatological: No rash, lesions/masses Respiratory: No cough, dyspnea Urologic: No hematuria, dysuria Abdominal:   No nausea, vomiting, diarrhea, bright red blood per rectum, melena, or hematemesis Neurologic:  No visual changes, wkns, changes in mental status. All other systems reviewed and are otherwise negative except as noted above.  Physical Exam    VS:  BP 110/60   Pulse 78   Ht 5\' 2"  (1.575 m)   Wt 183 lb 9.6 oz (83.3 kg)   SpO2 98%   BMI 33.58 kg/m  , BMI Body mass index is 33.58 kg/m. GEN: Well nourished, well developed, in no acute distress. HEENT: normal. Neck: Supple, no JVD, carotid bruits, or masses. Cardiac: RRR, no murmurs, rubs, or gallops. No clubbing, cyanosis, edema.  Radials/DP/PT 2+ and equal bilaterally.  Respiratory:  Respirations regular and unlabored, clear to auscultation bilaterally.  On 3 L nasal cannula GI: Soft, nontender, nondistended, BS + x 4. MS: no deformity or atrophy. Skin: warm and dry, no rash. Neuro:  Strength and sensation are intact. Psych: Normal affect.  Accessory Clinical Findings    Recent Labs: 09/11/2023: ALT 16; TSH 3.253 09/13/2023: BUN 17; Creatinine, Ser 1.19; Hemoglobin 12.3; Magnesium  2.1; Platelets 175; Potassium 3.6; Sodium 142   Recent Lipid Panel    Component Value Date/Time   CHOL 147 03/18/2021 0921   TRIG 664 (HH) 03/18/2021 0921   HDL 19 (L) 03/18/2021 0921   CHOLHDL 7.7 (H) 03/18/2021 0921   CHOLHDL 5 12/02/2009 0000   VLDL 58.2 (H) 12/02/2009 0000   LDLCALC 36 03/18/2021 0921   LDLDIRECT 45.5 12/02/2009 0000  ECG personally reviewed  by me today- EKG Interpretation Date/Time:  Tuesday Oct 03 2023 10:53:06 EDT Ventricular Rate:  79 PR Interval:  126 QRS Duration:  84 QT Interval:  372 QTC Calculation: 426 R Axis:   -44  Text Interpretation: Normal sinus rhythm with sinus arrhythmia Left axis deviation Pulmonary disease pattern When compared with ECG of 12-Sep-2023 11:07, Nonspecific T wave abnormality no longer evident in Lateral leads Confirmed by Lawana Pray [phone]) on 10/03/2023 11:11:03 AM    TEE 09/12/2023  IMPRESSIONS     1. Patient was tachycardic in the 160s throughout the procedure.   2. Left ventricular ejection fraction, by estimation, is 55 to 60%. The  left ventricle has normal function. The left ventricle has no regional  wall motion abnormalities.   3. Right ventricular systolic function is mildly reduced. The right  ventricular size is normal.   4. No left atrial/left atrial appendage thrombus was detected. The LAA  emptying velocity was 102 cm/s.   5. The mitral valve is normal in structure. Trivial mitral valve  regurgitation. No evidence of mitral stenosis.   6. The aortic valve is tricuspid. Aortic valve regurgitation is not  visualized. No aortic stenosis is present.   7. The inferior vena cava is normal in size with greater than 50%  respiratory variability, suggesting right atrial pressure of 3 mmHg.   Conclusion(s)/Recommendation(s): No LA/LAA thrombus identified. Successful  cardioversion performed with restoration of normal sinus rhythm.   FINDINGS   Left Ventricle: Left ventricular ejection fraction, by estimation, is 55  to 60%. The left ventricle has normal function. The left ventricle has no  regional wall motion abnormalities. The left ventricular internal cavity  size was normal in size. There is   no left ventricular hypertrophy.   Right Ventricle: The right ventricular size is normal. No increase in  right ventricular wall thickness. Right ventricular systolic function  is  mildly reduced.   Left Atrium: Left atrial size was normal in size. No left atrial/left  atrial appendage thrombus was detected. The LAA emptying velocity was 102  cm/s.   Right Atrium: Right atrial size was normal in size.   Pericardium: There is no evidence of pericardial effusion.   Mitral Valve: The mitral valve is normal in structure. Trivial mitral  valve regurgitation. No evidence of mitral valve stenosis.   Tricuspid Valve: The tricuspid valve is normal in structure. Tricuspid  valve regurgitation is trivial. No evidence of tricuspid stenosis.   Aortic Valve: The aortic valve is tricuspid. Aortic valve regurgitation is  not visualized. No aortic stenosis is present.   Pulmonic Valve: The pulmonic valve was normal in structure. Pulmonic valve  regurgitation is not visualized. No evidence of pulmonic stenosis.   Aorta: The aortic root is normal in size and structure. There is minimal  (Grade I) plaque.   Venous: The inferior vena cava is normal in size with greater than 50%  respiratory variability, suggesting right atrial pressure of 3 mmHg.   IAS/Shunts: No atrial level shunt detected by color flow Doppler.      Assessment & Plan   1.  Atrial flutter with RVR-EKG today shows sinus rhythm 79 bpm.  Denies recurrent episodes of accelerated or irregular heartbeat.  Underwent DCCV on 09/12/2023 and developed hypotension.  He required vasopressors albumin , and IV fluids. Avoid triggers caffeine, chocolate, EtOH, dehydration etc. Continue apixaban , metoprolol   This patients CHA2DS2-VASc Score and unadjusted Ischemic Stroke Rate (% per year) is equal to 3.2 % stroke  rate/year from a score of 3  Above score calculated as 1 point each if present [ HTN, DM, Vascular=MI/PAD/Aortic Plaque, Age if 65-74,]   Coronary artery disease-no chest pain today.  Denies exertional chest discomfort.  Cardiac troponins flat and elevated in the setting of elevated heart rate. No plans  for ischemic evaluation Continue aspirin , metoprolol  Heart healthy low-sodium diet  Essential hypertension-BP today 110/60. Maintain blood pressure log Continue metoprolol , Heart healthy low-sodium diet  OSA-reports compliance with CPAP.  Waking up well rested. Continue CPAP use Sleep hygiene instructions given Avoid supine sleeping  Disposition: Follow-up with Dr. Audery Blazing or me in 3-4 months.   Chet Cota. Atom Solivan NP-C     10/03/2023, 11:19 AM [facility] [address] Office [phone] Fax [phone]    I spent 14 minutes examining this patient, reviewing medications, and using patient centered shared decision making involving their cardiac care.   I spent  20 minutes reviewing past medical history,  medications, and prior cardiac tests.

## 2023-10-03 ENCOUNTER — Ambulatory Visit: Attending: General Practice | Admitting: General Practice

## 2023-10-03 ENCOUNTER — Encounter: Payer: Self-pay | Admitting: General Practice

## 2023-10-03 VITALS — BP 110/60 | HR 78 | Ht 62.0 in | Wt 183.6 lb

## 2023-10-03 DIAGNOSIS — I10 Essential (primary) hypertension: Secondary | ICD-10-CM | POA: Diagnosis not present

## 2023-10-03 DIAGNOSIS — E785 Hyperlipidemia, unspecified: Secondary | ICD-10-CM | POA: Diagnosis not present

## 2023-10-03 DIAGNOSIS — I4892 Unspecified atrial flutter: Secondary | ICD-10-CM

## 2023-10-03 DIAGNOSIS — I25118 Atherosclerotic heart disease of native coronary artery with other forms of angina pectoris: Secondary | ICD-10-CM | POA: Diagnosis not present

## 2023-10-03 DIAGNOSIS — I4891 Unspecified atrial fibrillation: Secondary | ICD-10-CM | POA: Diagnosis not present

## 2023-10-03 NOTE — Patient Instructions (Signed)
 Medication Instructions:  NO CHANGES   Lab Work: NONE  Testing/Procedures: NONE  Follow-Up: At [facility], you and your health needs are our priority.  As part of our continuing mission to provide you with exceptional heart care, our providers are all part of one team.  This team includes your primary Cardiologist (physician) and Advanced Practice Providers or APPs (Physician Assistants and Nurse Practitioners) who all work together to provide you with the care you need, when you need it.  Your next appointment:   3-4 MONTHS  Provider:   Alexandria Angel, MD OR Lawana Pray, FNP    Other Instructions PLEASE AVOID ANY FOOD OR DRINKS THAT MAY TRIGGER PALPITATIONS LIKE CAFFEINE, CHOCOLATE, AND ALCOHOL. STAY HYDRATED. KEEP USING YOUR CPAP.

## 2023-10-11 ENCOUNTER — Encounter (HOSPITAL_COMMUNITY): Payer: Self-pay | Admitting: Emergency Medicine

## 2023-10-11 ENCOUNTER — Observation Stay (HOSPITAL_COMMUNITY)
Admission: EM | Admit: 2023-10-11 | Discharge: 2023-10-12 | Disposition: A | Discharge status: Home / Self Care | Attending: Family Medicine | Admitting: Family Medicine

## 2023-10-11 ENCOUNTER — Emergency Department (HOSPITAL_COMMUNITY)

## 2023-10-11 ENCOUNTER — Telehealth: Payer: Self-pay | Admitting: Physician Assistant

## 2023-10-11 ENCOUNTER — Other Ambulatory Visit: Payer: Self-pay

## 2023-10-11 DIAGNOSIS — I4892 Unspecified atrial flutter: Secondary | ICD-10-CM | POA: Diagnosis not present

## 2023-10-11 DIAGNOSIS — Z79899 Other long term (current) drug therapy: Secondary | ICD-10-CM | POA: Insufficient documentation

## 2023-10-11 DIAGNOSIS — Z7982 Long term (current) use of aspirin: Secondary | ICD-10-CM | POA: Diagnosis not present

## 2023-10-11 DIAGNOSIS — E785 Hyperlipidemia, unspecified: Secondary | ICD-10-CM | POA: Diagnosis not present

## 2023-10-11 DIAGNOSIS — I10 Essential (primary) hypertension: Secondary | ICD-10-CM

## 2023-10-11 DIAGNOSIS — I4891 Unspecified atrial fibrillation: Principal | ICD-10-CM | POA: Diagnosis present

## 2023-10-11 DIAGNOSIS — E119 Type 2 diabetes mellitus without complications: Secondary | ICD-10-CM | POA: Diagnosis not present

## 2023-10-11 DIAGNOSIS — F329 Major depressive disorder, single episode, unspecified: Secondary | ICD-10-CM | POA: Insufficient documentation

## 2023-10-11 DIAGNOSIS — I480 Paroxysmal atrial fibrillation: Principal | ICD-10-CM | POA: Insufficient documentation

## 2023-10-11 DIAGNOSIS — I129 Hypertensive chronic kidney disease with stage 1 through stage 4 chronic kidney disease, or unspecified chronic kidney disease: Secondary | ICD-10-CM | POA: Diagnosis not present

## 2023-10-11 DIAGNOSIS — Z955 Presence of coronary angioplasty implant and graft: Secondary | ICD-10-CM | POA: Diagnosis not present

## 2023-10-11 DIAGNOSIS — E1122 Type 2 diabetes mellitus with diabetic chronic kidney disease: Secondary | ICD-10-CM | POA: Diagnosis not present

## 2023-10-11 DIAGNOSIS — Z87891 Personal history of nicotine dependence: Secondary | ICD-10-CM | POA: Insufficient documentation

## 2023-10-11 DIAGNOSIS — R002 Palpitations: Secondary | ICD-10-CM | POA: Diagnosis present

## 2023-10-11 DIAGNOSIS — E669 Obesity, unspecified: Secondary | ICD-10-CM | POA: Insufficient documentation

## 2023-10-11 DIAGNOSIS — I2510 Atherosclerotic heart disease of native coronary artery without angina pectoris: Secondary | ICD-10-CM | POA: Insufficient documentation

## 2023-10-11 DIAGNOSIS — G4733 Obstructive sleep apnea (adult) (pediatric): Secondary | ICD-10-CM | POA: Insufficient documentation

## 2023-10-11 DIAGNOSIS — R0602 Shortness of breath: Secondary | ICD-10-CM | POA: Diagnosis not present

## 2023-10-11 DIAGNOSIS — F32A Depression, unspecified: Secondary | ICD-10-CM | POA: Diagnosis not present

## 2023-10-11 DIAGNOSIS — J84112 Idiopathic pulmonary fibrosis: Secondary | ICD-10-CM

## 2023-10-11 DIAGNOSIS — N1832 Chronic kidney disease, stage 3b: Secondary | ICD-10-CM | POA: Insufficient documentation

## 2023-10-11 DIAGNOSIS — Z7901 Long term (current) use of anticoagulants: Secondary | ICD-10-CM | POA: Insufficient documentation

## 2023-10-11 DIAGNOSIS — E7800 Pure hypercholesterolemia, unspecified: Secondary | ICD-10-CM

## 2023-10-11 HISTORY — DX: Paroxysmal atrial fibrillation: I48.0

## 2023-10-11 LAB — CBC
HCT: 45.9 % (ref 39.0–52.0)
Hemoglobin: 15.7 g/dL (ref 13.0–17.0)
MCH: 31.3 pg (ref 26.0–34.0)
MCHC: 34.2 g/dL (ref 30.0–36.0)
MCV: 91.6 fL (ref 80.0–100.0)
Platelets: 236 10*3/uL (ref 150–400)
RBC: 5.01 MIL/uL (ref 4.22–5.81)
RDW: 12.2 % (ref 11.5–15.5)
WBC: 9.1 10*3/uL (ref 4.0–10.5)
nRBC: 0 % (ref 0.0–0.2)

## 2023-10-11 LAB — BASIC METABOLIC PANEL WITH GFR
Anion gap: 12 (ref 5–15)
BUN: 30 mg/dL — ABNORMAL HIGH (ref 8–23)
CO2: 24 mmol/L (ref 22–32)
Calcium: 9.3 mg/dL (ref 8.9–10.3)
Chloride: 104 mmol/L (ref 98–111)
Creatinine, Ser: 1.31 mg/dL — ABNORMAL HIGH (ref 0.61–1.24)
GFR, Estimated: 57 mL/min — ABNORMAL LOW (ref >60)
Glucose, Bld: 124 mg/dL — ABNORMAL HIGH (ref 70–99)
Potassium: 4.2 mmol/L (ref 3.5–5.1)
Sodium: 140 mmol/L (ref 135–145)

## 2023-10-11 LAB — TROPONIN I (HIGH SENSITIVITY): Troponin I (High Sensitivity): 16 ng/L (ref <18)

## 2023-10-11 MED ORDER — PIRFENIDONE 267 MG PO CAPS: 801.0000 mg | ORAL_CAPSULE | Freq: Three times a day (TID) | ORAL | Status: DC

## 2023-10-11 MED ORDER — PREDNISONE 5 MG PO TABS
5.0000 mg | ORAL_TABLET | ORAL | Status: DC
  Administered 2023-10-12: 5 mg via ORAL
  Filled 2023-10-11: qty 1

## 2023-10-11 MED ORDER — ONDANSETRON HCL 4 MG/2ML IJ SOLN: 4.0000 mg | Freq: Four times a day (QID) | INTRAMUSCULAR | Status: DC | PRN

## 2023-10-11 MED ORDER — PANTOPRAZOLE SODIUM 40 MG PO TBEC
40.0000 mg | DELAYED_RELEASE_TABLET | Freq: Every day | ORAL | Status: DC
  Administered 2023-10-12: 40 mg via ORAL
  Filled 2023-10-11: qty 1

## 2023-10-11 MED ORDER — MELATONIN 3 MG PO TABS: 3.0000 mg | ORAL_TABLET | Freq: Every evening | ORAL | Status: DC | PRN

## 2023-10-11 MED ORDER — SODIUM CHLORIDE 0.9 % IV SOLN: Freq: Once | INTRAVENOUS | Status: AC

## 2023-10-11 MED ORDER — AMIODARONE HCL IN DEXTROSE 360-4.14 MG/200ML-% IV SOLN
30.0000 mg/h | INTRAVENOUS | Status: DC
  Administered 2023-10-12: 30 mg/h via INTRAVENOUS
  Filled 2023-10-11: qty 200

## 2023-10-11 MED ORDER — ACETAMINOPHEN 650 MG RE SUPP: 650.0000 mg | Freq: Four times a day (QID) | RECTAL | Status: DC | PRN

## 2023-10-11 MED ORDER — AMIODARONE HCL IN DEXTROSE 360-4.14 MG/200ML-% IV SOLN
60.0000 mg/h | INTRAVENOUS | Status: DC
  Administered 2023-10-11: 60 mg/h via INTRAVENOUS
  Filled 2023-10-11: qty 200

## 2023-10-11 MED ORDER — ICOSAPENT ETHYL 1 G PO CAPS
2.0000 g | ORAL_CAPSULE | Freq: Two times a day (BID) | ORAL | Status: DC
  Administered 2023-10-12: 2 g via ORAL
  Filled 2023-10-11: qty 2

## 2023-10-11 MED ORDER — DILTIAZEM HCL-DEXTROSE 125-5 MG/125ML-% IV SOLN (PREMIX)
5.0000 mg/h | INTRAVENOUS | Status: DC
  Administered 2023-10-11: 5 mg/h via INTRAVENOUS
  Filled 2023-10-11: qty 125

## 2023-10-11 MED ORDER — APIXABAN 5 MG PO TABS
5.0000 mg | ORAL_TABLET | Freq: Two times a day (BID) | ORAL | Status: DC
  Administered 2023-10-12 (×2): 5 mg via ORAL
  Filled 2023-10-11 (×2): qty 1

## 2023-10-11 MED ORDER — ASPIRIN 81 MG PO TBEC: 81.0000 mg | DELAYED_RELEASE_TABLET | Freq: Every day | ORAL | Status: DC

## 2023-10-11 MED ORDER — INSULIN ASPART 100 UNIT/ML IJ SOLN: 0.0000 [IU] | Freq: Four times a day (QID) | INTRAMUSCULAR | Status: DC

## 2023-10-11 MED ORDER — ACETAMINOPHEN 325 MG PO TABS: 650.0000 mg | ORAL_TABLET | Freq: Four times a day (QID) | ORAL | Status: DC | PRN

## 2023-10-11 MED ORDER — FENOFIBRATE 160 MG PO TABS
160.0000 mg | ORAL_TABLET | Freq: Every day | ORAL | Status: DC
  Administered 2023-10-12: 160 mg via ORAL
  Filled 2023-10-11: qty 1

## 2023-10-11 MED ORDER — ATORVASTATIN CALCIUM 40 MG PO TABS
80.0000 mg | ORAL_TABLET | Freq: Every day | ORAL | Status: DC
  Administered 2023-10-12: 80 mg via ORAL
  Filled 2023-10-11: qty 2

## 2023-10-11 NOTE — ED Provider Notes (Signed)
 [place] EMERGENCY DEPARTMENT AT [facility] Provider Note   CSN: [id number] Arrival date & time: 10/11/23  2036     History {Add pertinent medical, surgical, social history, OB history to HPI:1} Chief Complaint  Patient presents with   Tachycardia    Gary Keller is a 73 y.o. male.  73 y.o. male with history of CAD status post PCI, interstitial lung disease, sleep apnea, hypertension, chronic kidney disease stage III, diabetes mellitus type 2, depression presents with palpitations.  Patient was diagnosed with new onset atrial fibrillation last month.  He was admitted.  He was cardioverted.  After cardioversion he had transient hypotension and ended up on Levophed in the ICU.  He reports that he has been doing well since recent admission.  He is compliant with previously prescribed medications including Eliquis .  His last dose of Eliquis  was this morning.  He reports that his symptoms began this evening after he finished dinner.  He was having a doughnut for dessert when he felt the palpitations.  He denies significant chest pain.  His last p.o. intake was 2 hours ago.  The history is provided by the patient and a relative.       Home Medications Prior to Admission medications   Medication Sig Start Date End Date Taking? Authorizing Provider  amLODipine  (NORVASC ) 5 MG tablet TAKE 1 TABLET(5 MG) BY MOUTH DAILY 02/22/21   Lenise Quince, MD  apixaban  (ELIQUIS ) 5 MG TABS tablet Take 1 tablet (5 mg total) by mouth 2 (two) times daily. 09/13/23   Pokhrel, Amador Bad, MD  aspirin  EC 81 MG tablet Take 1 tablet (81 mg total) by mouth daily. Swallow whole. 09/13/23 09/12/24  Pokhrel, Laxman, MD  atorvastatin  (LIPITOR) 80 MG tablet TAKE 1 TABLET(80 MG) BY MOUTH DAILY 02/03/23   Lenise Quince, MD  fenofibrate  160 MG tablet Take 160 mg by mouth daily.  01/11/13   [provider]  metoprolol  succinate (TOPROL -XL) 50 MG 24 hr tablet Take 50 mg by mouth daily.     [provider]  MOUNJARO 10 MG/0.5ML Pen Inject 10 mg into the skin once a week.    [provider]  omeprazole (PRILOSEC) 40 MG capsule Take 40 mg by mouth daily. 04/29/23   [provider]  Pirfenidone  (ESBRIET ) 267 MG CAPS Take 801 mg by mouth in the morning, at noon, and at bedtime. 08/28/23   Lind Repine, MD  predniSONE  (DELTASONE ) 5 MG tablet Take Monday, Wednesday and friday 02/06/23   Lind Repine, MD  VASCEPA  1 g capsule Take 2 capsules (2 g total) by mouth 2 (two) times daily. 08/16/23   Walker, Caitlin S, NP  venlafaxine  XR (EFFEXOR -XR) 75 MG 24 hr capsule Take 75 mg by mouth daily with breakfast.  09/03/09   [provider]      Allergies    Patient has no known allergies.    Review of Systems   Review of Systems  All other systems reviewed and are negative.   Physical Exam Updated Vital Signs BP 96/69 (BP Location: Right Arm)   Pulse (!) 168   Temp 97.6 F (36.4 C)   Resp 16   Wt 83 kg   SpO2 90%   BMI 33.47 kg/m  Physical Exam Vitals and nursing note reviewed.  Constitutional:      General: He is not in acute distress.    Appearance: Normal appearance. He is well-developed.  HENT:     Head: Normocephalic  and atraumatic.  Eyes:     Conjunctiva/sclera: Conjunctivae normal.     Pupils: Pupils are equal, round, and reactive to light.  Cardiovascular:     Rate and Rhythm: Tachycardia present. Rhythm irregular.     Heart sounds: Normal heart sounds.  Pulmonary:     Effort: Pulmonary effort is normal. No respiratory distress.     Breath sounds: Normal breath sounds.  Abdominal:     General: There is no distension.     Palpations: Abdomen is soft.     Tenderness: There is no abdominal tenderness.  Musculoskeletal:        General: No deformity. Normal range of motion.     Cervical back: Normal range of motion and neck supple.  Skin:    General: Skin is warm and dry.  Neurological:     General: No focal deficit present.      Mental Status: He is alert and oriented to person, place, and time.     ED Results / Procedures / Treatments   Labs (all labs ordered are listed, but only abnormal results are displayed) Labs Reviewed  BASIC METABOLIC PANEL WITH GFR  CBC  TROPONIN I (HIGH SENSITIVITY)    EKG None  Radiology No results found.  Procedures Procedures  {Document cardiac monitor, telemetry assessment procedure when appropriate:1}  Medications Ordered in ED Medications  diltiazem  (CARDIZEM ) 125 mg in dextrose  5% 125 mL (1 mg/mL) infusion (has no administration in time range)    ED Course/ Medical Decision Making/ A&P   {   Click here for ABCD2, HEART and other calculatorsREFRESH Note before signing :1}                              Medical Decision Making Amount and/or Complexity of Data Reviewed Labs: ordered. Radiology: ordered.  Risk Prescription drug management.   ***  {Document critical care time when appropriate:1} {Document review of labs and clinical decision tools ie heart score, Chads2Vasc2 etc:1}  {Document your independent review of radiology images, and any outside records:1} {Document your discussion with family members, caretakers, and with consultants:1} {Document social determinants of health affecting pt's care:1} {Document your decision making why or why not admission, treatments were needed:1} Final Clinical Impression(s) / ED Diagnoses Final diagnoses:  None    Rx / DC Orders ED Discharge Orders     None

## 2023-10-11 NOTE — H&P (Incomplete)
 History and Physical      Gary Keller ZOX:[id number] DOB: [birth year] DOA: 10/11/2023; DOS: 10/11/2023  PCP: [facility], [facility] (Inactive)  Patient coming from: home   I have personally briefly reviewed patient's old medical records in [facility]  Chief Complaint: Elevated heart rate  HPI: Gary Keller is a 73 y.o. 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   The patient has a history of diagnosed atrial fibrillation, pulm vein hospitalization at [facility] in April 2025 from 09/11/2023 to 09/13/2023.  On 09/12/2023, he underwent electrocardioversion, converting to sinus rhythm following a single shock.  Subsequent to this electrocardioversion on 09/12/2023, he developed some transient hypotension and was subsequently transferred to the ICU where he briefly required pressor therapy.  He was subsequently started on Eliquis  and discharged to home on 09/13/2023 on Eliquis  as well as metoprolol  succinate 50 mg p.o. daily.  This evening, after dinner and dessert, during which time he consumed Demet, patient reports that he developed some very mild palpitations, prompting him to check his pulse oximeter.  Per his home pulse oximeter, his oxygen saturations to be in the high 90s on room air, but found that his heart rate was in the 170s, prompting him to present to [facility] emergency department for further evaluation and management thereof.  His palpitations have subsequently improved, and he otherwise denies any acute symptoms.  Specifically, he denies any recent chest pain, shortness of breath, diaphoresis,  nausea, vomiting or dizziness, presyncope, or syncope.  He reports good compliance with his Eliquis  as well as metoprolol  succinate, as above.  Denies any recent subjective fever, chills, rigors, or generalized myalgias.  No recent cough or hemoptysis.  Since starting on Eliquis  1 month ago, he denies any interval melena or hematochezia.  His medical history is also notable for idiopathic pulmonary fibrosis, for which he follows with Dr.***As his outpatient pulmonologist and is noted to be on chronic prednisone  therapy.  Most recent TTE occurred on 09/03/2023, and was notable for LVEF 66 5%, no evidence of focal wall motion MIs, indeterminate diastolic parameters, and mild to moderate mitral regurgitation.     ED Course:  Vital signs in the ED were notable for the following: Afebrile; initial heart rates in the 170s, subsequent decreasing slightly in the 160s following initiation of amiodarone drip; systolic blood pressures in the 90s to low 100s, with most recent blood pressure noted to be 97/83; respiratory rate 17-20, oxygen saturation 96 to 100% on room air.  Labs were notable for the following: BMP notable for the following: Sodium 140, potassium 4.2, carbonate 24, creatinine 1.31 compared to 1.19 on 09/13/2023, glucose 124.  High sensitive troponin I initially 16, with repeat value currently pending.  CBC notable for white blood cell count 9100, hemoglobin 15.7 compared to 12.3 on 09/13/2023, platelet count 236.  Per my interpretation, EKG in ED demonstrated the following: Tachycardia, with heart rate 170, nonspecific T wave version in aVL, and no evidence of ST changes, including no evidence of ST elevation.  Imaging in the ED, per corresponding formal radiology read, was notable for the following: 1 view chest x-ray, in comparison to 1 view chest x-ray from 09/10/2023, showed no  evidence of interval changes or acute process, including no evidence of infiltrate edema, effusion, or pneumothorax,  while showing evidence of chronic changes consistent with his interstitial lung disease.  EDP discussed patient's case with on-call cardiology fellow, Dr. Dalia Ducking, who has seen the patient this evening, and will formally consult, with initial recommendations that include amiodarone drip. As the patient is currently asymptomatic and maintaining MAP's greater than 65 mmHg, Dr. Dalia Ducking did not feel that electrocardioversion was indicated at this time.   While in the ED, the following were administered: Diltiazem  drip, which was subsequently discontinued; amiodarone drip.  Normal saline at 125 cc/h.  Subsequently, the patient was admitted for further evaluation management of suspected atrial fibrillation with RVR.  ***red    Review of Systems: As per HPI otherwise 10 point review of systems negative.   Past Medical History:  Diagnosis Date   CAD (coronary artery disease)    Depression    Diabetes mellitus (HCC)    Borderline   Encounter for screening for malignant neoplasm of prostate    HTN (hypertension)    Hyperlipidemia    Non morbid obesity due to excess calories    Renal insufficiency    Rhinitis, allergic     Past Surgical History:  Procedure Laterality Date   APPENDECTOMY     CARDIOVERSION N/A 09/12/2023   Procedure: CARDIOVERSION;  Surgeon: Maudine Sos, MD;  Location: [facility] INVASIVE CV LAB;  Service: Cardiovascular;  Laterality: N/A;   COLONOSCOPY WITH ESOPHAGOGASTRODUODENOSCOPY (EGD)     LEFT HEART CATH  2006   Stent x3   TRANSESOPHAGEAL ECHOCARDIOGRAM (CATH LAB) N/A 09/12/2023   Procedure: TRANSESOPHAGEAL ECHOCARDIOGRAM;  Surgeon: Maudine Sos, MD;  Location: [facility] INVASIVE CV LAB;  Service: Cardiovascular;  Laterality: N/A;    Social History:  reports that he quit smoking about 22 years ago. His smoking use included cigarettes. He started smoking about 65 years ago. He has a 43 pack-year smoking history. He has never used smokeless tobacco. He reports current alcohol  use. He reports that he does not use drugs.   No Known Allergies  Family History  Problem Relation Age of Onset   Diabetes Brother    Heart disease Brother    Heart disease Father    Hypertension Brother    Stroke Mother    Stroke Brother    Lung disease Neg Hx    Rheumatologic disease Neg Hx     Family history reviewed and not pertinent    Prior to Admission medications   Medication Sig Start Date End Date Taking? Authorizing Provider  amLODipine  (NORVASC ) 5 MG tablet TAKE 1 TABLET(5 MG) BY MOUTH DAILY 02/22/21   Lenise Quince, MD  apixaban  (ELIQUIS ) 5 MG TABS tablet Take 1 tablet (5 mg total) by mouth 2 (two) times daily. 09/13/23   Pokhrel, Amador Bad, MD  aspirin  EC 81 MG tablet Take 1 tablet (81 mg total) by mouth daily. Swallow whole. 09/13/23 09/12/24  Pokhrel, Laxman, MD  atorvastatin  (LIPITOR) 80 MG tablet TAKE 1 TABLET(80 MG) BY MOUTH DAILY 02/03/23   Lenise Quince, MD  fenofibrate  160 MG tablet Take 160 mg by mouth daily.  01/11/13   [provider]  metoprolol  succinate (TOPROL -XL) 50 MG 24 hr tablet Take 50 mg by mouth daily.    [provider]  MOUNJARO 10 MG/0.5ML Pen Inject 10 mg into the skin once a week.    [provider]  omeprazole (PRILOSEC) 40 MG capsule Take 40 mg by mouth daily. 04/29/23  [provider]  Pirfenidone  (ESBRIET ) 267 MG CAPS Take 801 mg by mouth in the morning, at noon, and at bedtime. 08/28/23   Lind Repine, MD  predniSONE  (DELTASONE ) 5 MG tablet Take Monday, Wednesday and friday 02/06/23   Lind Repine, MD  VASCEPA  1 g capsule Take 2 capsules (2 g total) by mouth 2 (two) times daily. 08/16/23   Clearnce Curia, NP  venlafaxine  XR (EFFEXOR -XR) 75 MG 24 hr capsule Take 75 mg by mouth daily with breakfast.  09/03/09   [provider]     Objective    Physical Exam: Vitals:   10/11/23 2200 10/11/23 2230 10/11/23 2245 10/11/23 2300  BP: 101/72 (!) 88/67 94/74 97/83   Pulse: (!) 174 (!) 170  (!) 170 (!) 168  Resp: 14 (!) 27 17 20   Temp:      SpO2: 98% 96% 98% 98%  Weight:        General: appears to be stated age; alert, oriented Skin: warm, dry, no rash Head:  AT/[place] Mouth:  Oral mucosa membranes appear moist, normal dentition Neck: supple; trachea midline Heart: Tachycardic; did not appreciate any M/R/G Lungs: CTAB, did not appreciate any wheezes, rales, or rhonchi Abdomen: + BS; soft, ND, NT Vascular: 2+ pedal pulses b/l; 2+ radial pulses b/l Extremities: no peripheral edema, no muscle wasting Neuro: strength and sensation intact in upper and lower extremities b/l   Labs on Admission: I have personally reviewed following labs and imaging studies  CBC: Recent Labs  Lab 10/11/23 2052  WBC 9.1  HGB 15.7  HCT 45.9  MCV 91.6  PLT 236   Basic Metabolic Panel: Recent Labs  Lab 10/11/23 2052  NA 140  K 4.2  CL 104  CO2 24  GLUCOSE 124*  BUN 30*  CREATININE 1.31*  CALCIUM  9.3   GFR: Estimated Creatinine Clearance: 46.2 mL/min (A) (by C-G formula based on SCr of 1.31 mg/dL (H)). Liver Function Tests: No results for input(s): "AST", "ALT", "ALKPHOS", "BILITOT", "PROT", "ALBUMIN " in the last 168 hours. No results for input(s): "LIPASE", "AMYLASE" in the last 168 hours. No results for input(s): "AMMONIA" in the last 168 hours. Coagulation Profile: No results for input(s): "INR", "PROTIME" in the last 168 hours. Cardiac Enzymes: No results for input(s): "CKTOTAL", "CKMB", "CKMBINDEX", "TROPONINI" in the last 168 hours. BNP (last 3 results) No results for input(s): "PROBNP" in the last 8760 hours. HbA1C: No results for input(s): "HGBA1C" in the last 72 hours. CBG: No results for input(s): "GLUCAP" in the last 168 hours. Lipid Profile: No results for input(s): "CHOL", "HDL", "LDLCALC", "TRIG", "CHOLHDL", "LDLDIRECT" in the last 72 hours. Thyroid Function Tests: No results for input(s): "TSH", "T4TOTAL", "FREET4", "T3FREE", "THYROIDAB" in the last 72  hours. Anemia Panel: No results for input(s): "VITAMINB12", "FOLATE", "FERRITIN", "TIBC", "IRON", "RETICCTPCT" in the last 72 hours. Urine analysis: No results found for: "COLORURINE", "APPEARANCEUR", "LABSPEC", "PHURINE", "GLUCOSEU", "HGBUR", "BILIRUBINUR", "KETONESUR", "PROTEINUR", "UROBILINOGEN", "NITRITE", "LEUKOCYTESUR"  Radiological Exams on Admission: DG Chest Port 1 View Result Date: 10/11/2023 CLINICAL DATA:  Shortness of breath EXAM: PORTABLE CHEST 1 VIEW COMPARISON:  09/10/2023 FINDINGS: Stable cardiomediastinal silhouette. Diffuse pulmonary interstitial coarsening appears similar to prior and is likely due to chronic interstitial lung disease. No large pleural effusion. No pneumothorax. IMPRESSION: Chronic interstitial lung disease similar to 09/10/2023. Electronically Signed   By: Rozell Cornet M.D.   On: 10/11/2023 21:27      Assessment/Plan   Principal Problem:   Atrial fibrillation with RVR (HCC)   ***            #)  Atrial fibrillation with RVR: In the setting of a known h/o of paroxysmal atrial fibrillation, with dx of such made at time of hospitalization last month, noted to be tachycardic this evening, with initial heart rates in the 170s, followed by cardiology to be most likely representative of atrial fibrillation/atrial flutter with RVR.  He was initially started on IV diltiazem  drip, with no significant improvement in his rate control, prompting EDP to discuss patient's case with on-call cardiology fellow, Dr. Dalia Ducking, who has seen the patient this evening, and will formally consult, with initial recommendations that include amiodarone drip. As the patient is currently asymptomatic and maintaining MAP's greater than 65 mmHg, Dr. Dalia Ducking did not feel that electrocardioversion was indicated at this time.   Following ensuing initiation of amiodarone drip, heart rate has been doing slightly, now in the 160s, maintaining systolic blood pressures in the 90s to 100s,  with maps greater than 65 mmHg. asymptomatic at this time, including no chest pain.  Unclear source of exacerbation of the patient's atrial fibrillation with RVR. ACS felt to be less likely in the absence of any recent CP, while presenting EKG shows no evidence of acute ischemic changes, including no evidence of STEMI, We will initial high-sensitivity troponin I is not elevated.  No overt evidence of acute infectious process at this time, including no evidence of infiltrate on chest x-ray.  Will further evaluate for any underlying infectious contribution, as further described below.  Clinically, acute PE appears less likely, rendered further less likely given the patient's anticoagulation on Eliquis . Of note, in the setting of CHA2DS2-VASc score of  ***, there is an indication for continuation of chronic anticoagulation for thromboembolic prophylaxis.  It is noted that he is on amlodipine  for hypertension as an outpatient, which may resulting reflex tachycardia.  Will hold home amlodipine  for now.  Outpatient AV nodal blocking regimen consists of metoprolol  succinate 50 mg p.o. daily.   Most recent TTE, was performed on 09/11/2023, and demonstrated LVEF 60 to 65%, as well as mild to moderate mitral regurgitation, with additional results as conveyed above.  No clinical or radiographic evidence to suggest contributory or resolving acutely decompensated heart failure at this time.  Will continue to closely monitor ensuing volume status as outlined below.   Plan: Monitor strict I's & O's and daily weights. Monitor on telemetry. Check serum Mg level with prn supplementation to maintain levels of greater than or equal to 2.0. Repeat CMP/CBC in the AM. Cardiology to formally consult, as above. per cardiology recommendation, will continue amiodarone drip. ve.  will follow for additional cardiology recommendations.  Holding home metoprolol  succinate for now.  Continue outpatient Eliquis .  Check TSH, BNP,  procalcitonin level, urinalysis, urinary drug screen.  Hold amlodipine  for now, as above. For now, I have left pt npo with sips with meds EKG should subsequently require electrocardioversion.                    #) Type 2 Diabetes Mellitus: documented history of such. Home insulin  regimen:  ***. Home oral hypoglycemic agents:  ***. presenting blood sugar:  ***. Most recent A1c noted to be  ***.   Plan: accuchecks QAC and HS with low dose SSI. ***    hold home oral hypoglycemic agents during this hospitalization. ***    *** complicated by?  *** in terms of initial dose of basal insulin  to be started during this hospitalization, will resume approximately half of outpatient dose in order to reduce risk for  ensuing hypoglycemia                    ***                  ***                  ***                  ***                   ***                  ***                  ***                  ***                  ***                 ***                ***  DVT prophylaxis: SCD's ***  Code Status: Full code*** Family Communication: none*** Disposition Plan: Per Rounding Team Consults called: EDP discussed patient's case with on-call cardiology fellow, Dr. Dalia Ducking, who has seen the patient this evening, and will formally consult, with initial recommendations that include amiodarone drip. As the patient is currently asymptomatic and maintaining MAP's greater than 65 mmHg, Dr. Dalia Ducking did not feel that electrocardioversion was indicated at this time. ;  Admission status: ***     I SPENT GREATER THAN 75 *** MINUTES IN CLINICAL CARE TIME/MEDICAL DECISION-MAKING IN COMPLETING THIS ADMISSION.      Gattis Kass Ramatoulaye Pack DO [facility]  From 7PM - 7AM   10/11/2023, 11:22 PM   ***

## 2023-10-11 NOTE — ED Triage Notes (Signed)
 PT states around 7pm tonight while eating krispy kreme donut felt heart racing. Denies any SOB or chest pain. Pt was cardioverted 1 month ago. Wears 3l o2 at all times.

## 2023-10-11 NOTE — ED Notes (Signed)
 Hr is still 175 diltiazem   drip is at 10mcg/hr. No chest pain or discomfort.

## 2023-10-11 NOTE — Telephone Encounter (Signed)
   The patient's wife called the answering service after-hours today. He has a history of atrial flutter and was cardioverted in April. This evening his HR started going back up, 165-172, with BP 94/72. He feels okay but can tell something's off. With those VS, unable to titrate meds over the phone -> needs in-person management. Recommended they proceed to ER. Discussed EMS as an option; they plan to have son drive him instead. He is aware not to drive himself. The patient's wife verbalized understanding and gratitude. Will cc: to Lawana Pray NP who recently evaluated patient as FYI.   Nakia Remmers N Carman Auxier, PA-C

## 2023-10-12 ENCOUNTER — Other Ambulatory Visit (HOSPITAL_COMMUNITY): Payer: Self-pay

## 2023-10-12 DIAGNOSIS — J84112 Idiopathic pulmonary fibrosis: Secondary | ICD-10-CM | POA: Diagnosis present

## 2023-10-12 DIAGNOSIS — I4891 Unspecified atrial fibrillation: Secondary | ICD-10-CM | POA: Diagnosis not present

## 2023-10-12 DIAGNOSIS — I483 Typical atrial flutter: Secondary | ICD-10-CM

## 2023-10-12 LAB — PROCALCITONIN: Procalcitonin: <0.1 ng/mL

## 2023-10-12 LAB — HEMOGLOBIN A1C
Hgb A1c MFr Bld: 4.5 % — ABNORMAL LOW (ref 4.8–5.6)
Mean Plasma Glucose: 82.45 mg/dL

## 2023-10-12 LAB — CBG MONITORING, ED
Glucose-Capillary: 115 mg/dL — ABNORMAL HIGH (ref 70–99)
Glucose-Capillary: 113 mg/dL — ABNORMAL HIGH (ref 70–99)
Glucose-Capillary: 124 mg/dL — ABNORMAL HIGH (ref 70–99)

## 2023-10-12 LAB — MAGNESIUM: Magnesium: 2 mg/dL (ref 1.7–2.4)

## 2023-10-12 LAB — TSH: TSH: 3.133 u[IU]/mL (ref 0.350–4.500)

## 2023-10-12 LAB — TROPONIN I (HIGH SENSITIVITY): Troponin I (High Sensitivity): 40 ng/L — ABNORMAL HIGH (ref <18)

## 2023-10-12 MED ORDER — DRONEDARONE HCL 400 MG PO TABS
400.0000 mg | ORAL_TABLET | Freq: Two times a day (BID) | ORAL | 0 refills | Status: DC
  Filled 2023-10-12: qty 60, 30d supply, fill #0

## 2023-10-12 MED ORDER — METOPROLOL SUCCINATE ER 25 MG PO TB24
50.0000 mg | ORAL_TABLET | Freq: Every day | ORAL | Status: DC
  Administered 2023-10-12: 50 mg via ORAL
  Filled 2023-10-12: qty 2

## 2023-10-12 MED ORDER — DRONEDARONE HCL 400 MG PO TABS
400.0000 mg | ORAL_TABLET | Freq: Two times a day (BID) | ORAL | Status: DC
  Filled 2023-10-12: qty 1

## 2023-10-12 NOTE — Progress Notes (Signed)
 Rounding Note    Patient Name: Gary Keller Date of Encounter: 10/12/2023  [facility] Cardiologist: Alexandria Angel, MD   Subjective   No CP or dyspnea  Inpatient Medications    Scheduled Meds:  apixaban   5 mg Oral BID   aspirin  EC  81 mg Oral Daily   atorvastatin   80 mg Oral Daily   fenofibrate   160 mg Oral Daily   icosapent  Ethyl  2 g Oral BID   insulin  aspart  0-9 Units Subcutaneous Q6H   pantoprazole   40 mg Oral Daily   Pirfenidone   801 mg Oral TID   predniSONE   5 mg Oral Once per day on Tuesday Thursday Saturday   Continuous Infusions:  amiodarone 30 mg/hr (10/12/23 0418)   PRN Meds: acetaminophen **OR** acetaminophen, melatonin, ondansetron (ZOFRAN) IV   Vital Signs    Vitals:   10/12/23 0530 10/12/23 0600 10/12/23 0630 10/12/23 0852  BP: 121/70 121/74 113/70 122/69  Pulse: 73 73 75 74  Resp: (!) 21 19 17 20   Temp:    98.2 F (36.8 C)  TempSrc:    Oral  SpO2: 98% 98% 98% 100%  Weight:       No intake or output data in the 24 hours ending 10/12/23 0933    10/11/2023    8:53 PM 10/03/2023   10:46 AM 09/11/2023    2:45 AM  Last 3 Weights  Weight (lbs) 182 lb 15.7 oz 183 lb 9.6 oz 182 lb 1.6 oz  Weight (kg) 83 kg 83.28 kg 82.6 kg      Telemetry    Sinus with PACs - Personally Reviewed    Physical Exam   GEN: No acute distress.   Neck: No JVD Cardiac: RRR, no murmurs, rubs, or gallops.  Respiratory: Clear to auscultation bilaterally. GI: Soft, nontender, non-distended  MS: No edema Neuro:  Nonfocal  Psych: Normal affect   Labs    High Sensitivity Troponin:   Recent Labs  Lab 10/11/23 2052 10/12/23 0109  TROPONINIHS 16 40*     Chemistry Recent Labs  Lab 10/11/23 2052 10/12/23 0109  NA 140  --   K 4.2  --   CL 104  --   CO2 24  --   GLUCOSE 124*  --   BUN 30*  --   CREATININE 1.31*  --   CALCIUM  9.3  --   MG  --  2.0  GFRNONAA 57*  --   ANIONGAP 12  --      Hematology Recent Labs  Lab 10/11/23 2052   WBC 9.1  RBC 5.01  HGB 15.7  HCT 45.9  MCV 91.6  MCH 31.3  MCHC 34.2  RDW 12.2  PLT 236   Thyroid  Recent Labs  Lab 10/12/23 0109  TSH 3.133      Radiology    DG Chest Port 1 View Result Date: 10/11/2023 CLINICAL DATA:  Shortness of breath EXAM: PORTABLE CHEST 1 VIEW COMPARISON:  09/10/2023 FINDINGS: Stable cardiomediastinal silhouette. Diffuse pulmonary interstitial coarsening appears similar to prior and is likely due to chronic interstitial lung disease. No large pleural effusion. No pneumothorax. IMPRESSION: Chronic interstitial lung disease similar to 09/10/2023. Electronically Signed   By: Rozell Cornet M.D.   On: 10/11/2023 21:27    Patient Profile     73 year old male with past medical history of coronary artery disease, pulmonary fibrosis on chronic home oxygen, hypertension, hyperlipidemia, obstructive sleep apnea for evaluation of atrial flutter.  Patient admitted April  2025 with new onset atrial flutter.  Echo 4/25 showed normal LV function; mild to moderate MR. Had TEE guided cardioversion with plans for outpatient evaluation for atrial flutter ablation.  He was readmitted last evening with recurrent atrial flutter.  Assessment & Plan    1 atrial flutter-patient had TEE guided cardioversion of atrial flutter in late April.  He now presents with recurrent atrial flutter and was placed on IV amiodarone last evening.  Given his pulmonary fibrosis/risk of pulmonary toxicity I will discontinue amiodarone.  I did discuss patient with Dr. Arlester Ladd.  This is his second bout of atrial flutter in 4 weeks.  He cannot take flecainide due to underlying coronary disease.  Will plan to treat with dronedarone 400 mg twice daily to help maintain sinus rhythm (lower risk for pulmonary toxicity).  He will then follow-up with electrophysiology as an outpatient to consider ablation.  Continue apixaban .  Resume Toprol  50 mg daily.  He will be at higher risk of atrial arrhythmias in the future  given underlying pulmonary disease.     2 coronary artery disease-discontinue aspirin  given need for apixaban .  Continue statin.  He denies chest pain.   3 hypertension-we will continue Toprol .  I am not convinced he needs amlodipine  at this time.   4 hyperlipidemia-continue statin.   5 pulmonary disease-follow-up pulmonary.  Continue preadmission medications.    6 obstructive sleep apnea-continue CPAP.  For questions or updates, please contact [facility] Please consult [email or web address] for contact info under        Signed, Alexandria Angel, MD  10/12/2023, 9:33 AM

## 2023-10-12 NOTE — Consult Note (Signed)
 Cardiology Consultation   Patient ID: Gary Keller MRN: [id number]; DOB: 1950/09/17  Admit date: 10/11/2023 Date of Consult: 10/12/2023  PCP:  [facility], [facility] (Inactive)   [facility] Providers Cardiologist:  Alexandria Angel, MD   {   Patient Profile:   Gary Keller is a 73 y.o. male with a hx of CADs/p PCI in 2010, AFL, interstitial lung disease, sleep apnea, hypertension, chronic kidney disease stage III, diabetes mellitus type 2, who is being seen 10/12/2023 for the evaluation of AFL with RVR.  History of Present Illness:   Gary Keller reports that he started to have neck and shoulder discomfort this evening, which prompted him to check his pulse, which was in the 160s. He subsequently presented to the ED given his HR. He reports no chest pain, SOB, PND, orthopnea, palpitations, lightheadedness, pre-syncope, or syncope.He has been on apixaban  and has not missed any doses over the past 3 weeks. He was admitted on 4/27 for a new dx of AFL with RVR and underwent TEE/DCCV, which was complicated by hypotension following the DCCV.  In the ED, he was found to be in AF with RVR with rates up to 170s. He was normotensive. He was started on diltazem gtt (no loading dose) with no significant improvement in the rates. Cardiology was consulted.    Past Medical History:  Diagnosis Date   CAD (coronary artery disease)    Depression    Diabetes mellitus (HCC)    Borderline   Encounter for screening for malignant neoplasm of prostate    HTN (hypertension)    Hyperlipidemia    Non morbid obesity due to excess calories    Paroxysmal atrial fibrillation (HCC)    Renal insufficiency    Rhinitis, allergic     Past Surgical History:  Procedure Laterality Date   APPENDECTOMY     CARDIOVERSION N/A 09/12/2023   Procedure: CARDIOVERSION;  Surgeon: Maudine Sos, MD;  Location: [facility] INVASIVE CV LAB;  Service: Cardiovascular;  Laterality: N/A;    COLONOSCOPY WITH ESOPHAGOGASTRODUODENOSCOPY (EGD)     LEFT HEART CATH  2006   Stent x3   TRANSESOPHAGEAL ECHOCARDIOGRAM (CATH LAB) N/A 09/12/2023   Procedure: TRANSESOPHAGEAL ECHOCARDIOGRAM;  Surgeon: Maudine Sos, MD;  Location[facility] INVASIVE CV LAB;  Service: Cardiovascular;  Laterality: N/A;     Home Medications:  Prior to Admission medications   Medication Sig Start Date End Date Taking? Authorizing Provider  amLODipine  (NORVASC ) 5 MG tablet TAKE 1 TABLET(5 MG) BY MOUTH DAILY 02/22/21   Lenise Quince, MD  apixaban  (ELIQUIS ) 5 MG TABS tablet Take 1 tablet (5 mg total) by mouth 2 (two) times daily. 09/13/23   Pokhrel, Amador Bad, MD  aspirin  EC 81 MG tablet Take 1 tablet (81 mg total) by mouth daily. Swallow whole. 09/13/23 09/12/24  Pokhrel, Laxman, MD  atorvastatin  (LIPITOR) 80 MG tablet TAKE 1 TABLET(80 MG) BY MOUTH DAILY 02/03/23   Lenise Quince, MD  fenofibrate  160 MG tablet Take 160 mg by mouth daily.  01/11/13   [provider]  metoprolol  succinate (TOPROL -XL) 50 MG 24 hr tablet Take 50 mg by mouth daily.    [provider]  MOUNJARO 10 MG/0.5ML Pen Inject 10 mg into the skin once a week.    [provider]  omeprazole (PRILOSEC) 40 MG capsule Take 40 mg by mouth daily. 04/29/23   [provider]  Pirfenidone  (ESBRIET ) 267 MG CAPS Take 801 mg by mouth in the morning, at  noon, and at bedtime. 08/28/23   Lind Repine, MD  predniSONE  (DELTASONE ) 5 MG tablet Take Monday, Wednesday and friday 02/06/23   Lind Repine, MD  VASCEPA  1 g capsule Take 2 capsules (2 g total) by mouth 2 (two) times daily. 08/16/23   Clearnce Curia, NP  venlafaxine  XR (EFFEXOR -XR) 75 MG 24 hr capsule Take 75 mg by mouth daily with breakfast.  09/03/09   [provider]    Inpatient Medications: Scheduled Meds:  apixaban   5 mg Oral BID   aspirin  EC  81 mg Oral Daily   atorvastatin   80 mg Oral Daily   fenofibrate   160 mg Oral Daily   icosapent  Ethyl  2 g Oral  BID   insulin  aspart  0-9 Units Subcutaneous Q6H   pantoprazole   40 mg Oral Daily   Pirfenidone   801 mg Oral TID   [START ON 10/13/2023] predniSONE   5 mg Oral Q48H   Continuous Infusions:  amiodarone 60 mg/hr (10/11/23 2220)   amiodarone     PRN Meds: acetaminophen **OR** acetaminophen, melatonin, ondansetron (ZOFRAN) IV  Allergies:   No Known Allergies  Social History:   Social History   Socioeconomic History   Marital status: Married    Spouse name: Not on file   Number of children: 3   Years of education: Not on file   Highest education level: Not on file  Occupational History    Comment: Retired  Tobacco Use   Smoking status: Former    Current packs/day: 0.00    Average packs/day: 1 pack/day for 43.0 years (43.0 ttl pk-yrs)    Types: Cigarettes    Start date: 82    Quit date: 05/16/2001    Years since quitting: 22.4   Smokeless tobacco: Never  Vaping Use   Vaping status: Never Used  Substance and Sexual Activity   Alcohol use: Yes    Alcohol/week: 0.0 standard drinks of alcohol    Comment: Occasional   Drug use: No   Sexual activity: Not on file  Other Topics Concern   Not on file  Social History Narrative   [facility] (05/27/16):   Originally from [facility]. Has always lived in [place]. 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 Does have exposure to chemicals from spraying. No mold exposure. Remote cockatiel exposure in a previous home. No hot tub exposure. Enjoys watching his grandchildren and hunting.    Social Drivers of Corporate investment banker Strain: Low Risk  (09/23/2023)   Received from [facility]   Overall Financial Resource Strain (CARDIA)    Difficulty of Paying Living Expenses: Not hard at all  Food Insecurity: No Food Insecurity (09/23/2023)   Received from [facility]   Hunger Vital Sign    Worried About Running Out of Food in the Last Year: Never true    Ran Out of Food in the Last Year: Never true   Transportation Needs: No Transportation Needs (09/23/2023)   Received from [facility] - Transportation    Lack of Transportation (Medical): No    Lack of Transportation (Non-Medical): No  Physical Activity: Unknown (09/23/2023)   Received from [facility]   Exercise Vital Sign    Days of Exercise per Week: 0 days    Minutes of Exercise per Session: Not on file  Stress: No Stress Concern Present (09/23/2023)   Received from [facility] - Occupational Stress Questionnaire  Feeling of Stress : Not at all  Social Connections: Moderately Integrated (09/23/2023)   Received from [facility]   Social Network    How would you rate your social network (family, work, friends)?: Adequate participation with social networks  Intimate Partner Violence: Not At Risk (09/23/2023)   [facility] from [facility]   HITS    Over the last 12 months how often did your partner physically hurt you?: Never    Over the last 12 months how often did your partner insult you or talk down to you?: Never    Over the last 12 months how often did your partner threaten you with physical harm?: Never    Over the last 12 months how often did your partner scream or curse at you?: Never    Family History:   Family History  Problem Relation Age of Onset   Diabetes Brother    Heart disease Brother    Heart disease Father    Hypertension Brother    Stroke Mother    Stroke Brother    Lung disease Neg Hx    Rheumatologic disease Neg Hx      ROS:  Please see the history of present illness.  All other ROS reviewed and negative.     Physical Exam/Data:   Vitals:   10/11/23 2230 10/11/23 2245 10/11/23 2300 10/11/23 2330  BP: (!) 88/67 94/74 97/83  101/77  Pulse: (!) 170 (!) 170 (!) 168 (!) 168  Resp: (!) 27 17 20  (!) 21  Temp:      SpO2: 96% 98% 98% 99%  Weight:       No intake or output data in the 24 hours ending 10/12/23 0013    10/11/2023    8:53  PM 10/03/2023   10:46 AM 09/11/2023    2:45 AM  Last 3 Weights  Weight (lbs) 182 lb 15.7 oz 183 lb 9.6 oz 182 lb 1.6 oz  Weight (kg) 83 kg 83.28 kg 82.6 kg     Body mass index is 33.47 kg/m.  General:  Well nourished, well developed, in no acute distress HEENT: normal Neck: no JVD Vascular: No carotid bruits; Distal pulses 2+ bilaterally Cardiac:  Tachycardiac, no m/r/g Lungs:  clear to auscultation bilaterally, no wheezing, rhonchi or rales  Abd: soft, nontender Ext: no edema Musculoskeletal:  No deformities, BUE and BLE strength normal and equal Skin: warm and dry  Neuro:  CNs 2-12 intact, no focal abnormalities noted Psych:  Normal affect   EKG:  The EKG was personally reviewed and demonstrates:  AFL with RVR Telemetry:  Telemetry was personally reviewed and demonstrates:  AFL with RVR  Relevant CV Studies: TTE 08/2023: 1. Left ventricular ejection fraction, by estimation, is 60 to 65%. The left ventricle has normal function. The left ventricle has no regional wall motion abnormalities. Left ventricular diastolic function could not be evaluated due to atrial flutter. 2. Right ventricular systolic function is mildly reduced. The right ventricular size is normal. Visually right ventricular wall thickness is above normal limits. There is normal pulmonary artery systolic pressure. The estimated right ventricular systolic pressure is 32.4 mmHg. 3. The mitral valve is normal in structure. Mild to moderate mitral valve regurgitation. No evidence of mitral stenosis. 4. The aortic valve is tricuspid. Aortic valve regurgitation is not visualized. Aortic valve sclerosis is present, with no evidence of aortic valve stenosis.  Laboratory Data:  High Sensitivity Troponin:   Recent Labs  Lab 10/11/23 2052  TROPONINIHS 16  Chemistry Recent Labs  Lab 10/11/23 2052  NA 140  K 4.2  CL 104  CO2 24  GLUCOSE 124*  BUN 30*  CREATININE 1.31*  CALCIUM  9.3  GFRNONAA 57*  ANIONGAP 12     No results for input(s): "PROT", "ALBUMIN ", "AST", "ALT", "ALKPHOS", "BILITOT" in the last 168 hours. Lipids No results for input(s): "CHOL", "TRIG", "HDL", "LABVLDL", "LDLCALC", "CHOLHDL" in the last 168 hours.  Hematology Recent Labs  Lab 10/11/23 2052  WBC 9.1  RBC 5.01  HGB 15.7  HCT 45.9  MCV 91.6  MCH 31.3  MCHC 34.2  RDW 12.2  PLT 236   Thyroid No results for input(s): "TSH", "FREET4" in the last 168 hours.  BNPNo results for input(s): "BNP", "PROBNP" in the last 168 hours.  DDimer No results for input(s): "DDIMER" in the last 168 hours.   Radiology/Studies:  DG Chest Port 1 View Result Date: 10/11/2023 CLINICAL DATA:  Shortness of breath EXAM: PORTABLE CHEST 1 VIEW COMPARISON:  09/10/2023 FINDINGS: Stable cardiomediastinal silhouette. Diffuse pulmonary interstitial coarsening appears similar to prior and is likely due to chronic interstitial lung disease. No large pleural effusion. No pneumothorax. IMPRESSION: Chronic interstitial lung disease similar to 09/10/2023. Electronically Signed   By: Rozell Cornet M.D.   On: 10/11/2023 21:27     Assessment and Plan:   AFL with RVR Recent diagnosis in 08/2023 during an admission for AFL with RVR, which required TEE/DCCV. No LAA thrombus on TEE at that time. Has been on apixaban  with no missed doses. LVEF is normal in 08/2023. Now presents with mildly symptomatic AFL with RVR with rates up to 170s.  - Start IV amiodarone loading for rate control. Would only use it for a short period of time given his interstitial lung disease. - NPO for DCCV in the AM - Consider AFL ablation - Continue apixaban  5 mg BID   Risk Assessment/Risk Scores:          CHA2DS2-VASc Score = 3  This indicates a 3.2% annual risk of stroke. The patient's score is based upon: CHF History: 0 HTN History: 1 Diabetes History: 0 Stroke History: 0 Vascular Disease History: 1 Age Score: 1 Gender Score: 0         For questions or updates, please  contact [facility] Please consult [email or web address] for contact info under    Signed, Finas Huger, MD  10/12/2023 12:13 AM

## 2023-10-12 NOTE — Progress Notes (Signed)
 Transition of Care [facility]) - Inpatient Brief Assessment   Patient Details  Name: Gary Keller MRN: [id number] Date of Birth: 1951-05-13  Transition of Care [facility]) CM/SW Contact:    Joanette Moynahan, RN Phone Number: 10/12/2023, 11:31 AM   Clinical Narrative:  Transition of Care Department [facility]) has reviewed patient and no TOC needs have been identified at this time. We will continue to monitor patient advancement through interdisciplinary progression rounds. If new patient transition needs arise, please place a TOC consult.     Transition of Care Asessment: Insurance and Status: (P) Insurance coverage has been reviewed Patient has primary care physician: (P) Yes Home environment has been reviewed: (P) home with spouse and son Prior level of function:: (P) independent.  Has DME oxygen (Adapt) and CPAP (Adapt) Prior/Current Home Services: (P) No current home services Social Drivers of Health Review: (P) SDOH reviewed no interventions necessary Readmission risk has been reviewed: (P) Yes Transition of care needs: (P) no transition of care needs at this time

## 2023-10-12 NOTE — ED Notes (Signed)
 Pt hr converted to sinus rythem

## 2023-10-12 NOTE — Care Management CC44 (Signed)
 Condition Code 44 Documentation Completed  Patient Details  Name: Gary Keller MRN: [id number] Date of Birth: 1951-04-06   Condition Code 44 given:  Yes Patient signature on Condition Code 44 notice:  Yes Documentation of 2 MD's agreement:  Yes Code 44 added to claim:  Yes    Joanette Moynahan, RN 10/12/2023, 11:30 AM

## 2023-10-12 NOTE — Discharge Summary (Signed)
 Physician Discharge Summary   Patient: Gary Keller MRN: [id number] DOB: 1951-02-14  Admit date:     10/11/2023  Discharge date: 10/12/23  Discharge Physician: Ephriam Hashimoto   PCP: Medicine, [facility] (Inactive)     Recommendations at discharge:  Follow up with Electrophysiology Dr. Daneil Dunker tomorrow for atrial fibrillation on new dronedarone     Discharge Diagnoses: Principal Problem:   Atrial fibrillation with RVR (HCC) Active Problems:   OBESITY   Essential hypertension   CAD S/P PCI 2010   DM2 (diabetes mellitus, type 2) (HCC)   HLD (hyperlipidemia)   Depression   OSA (obstructive sleep apnea)   Chronic kidney disease (CKD) stage G3b/A2, moderately decreased glomerular filtration rate (GFR) between 30-44 mL/min/1.73 square meter and albuminuria creatinine ratio between 30-299 mg/g (HCC)   Idiopathic pulmonary fibrosis [facility])      Hospital Course: 73 y.o. M with obesity, CAD s/p remote PCI, Aflutter, ILD, OSA, HTN, DM and CKD IIIb 1.3-1.6 who presented with tachycardia.    In the ER, ECG showed rapid Afib. Started on diltiazem  without effect.  Cardiology were consulted, initiated IV amiodarone and he cardioverted.  Later the same day, the case was discussed with EP, amiodarone was stopped, and he was transitioned to dronedarone due to lower incidence of pulmonary toxicity.  Arrangements were made for EP follow up for evaluation for ablation and discontinuation of dronedarone.  He tolerated ambulation and diet and felt at baseline.             The [place]  Controlled Substances Registry was reviewed for this patient prior to discharge.  Consultants: Cardiology, Dr. Audery Blazing   Disposition: Home Diet recommendation:  Discharge Diet Orders (From admission, onward)     Start     Ordered   10/12/23 0000  Diet - low sodium heart healthy        10/12/23 1019             DISCHARGE MEDICATION: Allergies as of  10/12/2023   No Known Allergies      Medication List     STOP taking these medications    amLODipine  5 MG tablet Commonly known as: NORVASC    aspirin  EC 81 MG tablet       TAKE these medications    apixaban  5 MG Tabs tablet Commonly known as: ELIQUIS  Take 1 tablet (5 mg total) by mouth 2 (two) times daily.   atorvastatin  80 MG tablet Commonly known as: LIPITOR TAKE 1 TABLET(80 MG) BY MOUTH DAILY What changed: See the new instructions.   dronedarone 400 MG tablet Commonly known as: MULTAQ Take 1 tablet (400 mg total) by mouth 2 (two) times daily with a meal.   fenofibrate  160 MG tablet Take 160 mg by mouth daily.   metoprolol  succinate 50 MG 24 hr tablet Commonly known as: TOPROL -XL Take 50 mg by mouth daily.   Mounjaro 12.5 MG/0.5ML Pen Generic drug: tirzepatide Inject 12.5 mg into the skin once a week. Inject on Sunday   omeprazole 40 MG capsule Commonly known as: PRILOSEC Take 40 mg by mouth daily.   Pirfenidone  267 MG Caps Commonly known as: Esbriet  Take 801 mg by mouth in the morning, at noon, and at bedtime.   predniSONE  5 MG tablet Commonly known as: DELTASONE  Take Monday, Wednesday and friday What changed:  how much to take how to take this when to take this additional instructions   Vascepa  1 g capsule Generic drug: icosapent  Ethyl Take 2  capsules (2 g total) by mouth 2 (two) times daily.   venlafaxine  XR 75 MG 24 hr capsule Commonly known as: EFFEXOR -XR Take 75 mg by mouth at bedtime.        Follow-up Information     Medicine, [facility] Follow up.   Specialty: Family Medicine        Ardeen Kohler, MD Follow up.   Specialties: Cardiology, Radiology Why: Appointment with Electrophysiology scheduled for 10/13/2023 at 3:15pm. Please arrive 15 minutes early for check-in. Contact information: [address] [place] [address] [phone]                 Discharge Instructions     Diet -  low sodium heart healthy   Complete by: As directed    Discharge instructions   Complete by: As directed    **IMPORTANT DISCHARGE INSTRUCTIONS**   From Dr. Darlyn Eke: You were admitted for rapid atrial fibrillation  To suppress the afib, you were treated here with amiodarone  We are stopping this due to the potential for lung damage.  We recommend instead (for now) the related therapy dronedarone, which has less chance of lung toxicity  Take dronedarone 400 mg twice daily Avoid grapefruit  Go see the EP doctors as directed Call Dr. Lourdes Roy office if you have questions or concerns  STOP the medicines amlodipine  and aspirin    Increase activity slowly   Complete by: As directed        Discharge Exam: Filed Weights   10/11/23 2053  Weight: 83 kg    General: Pt is alert, awake, not in acute distress Cardiovascular: RRR, nl S1-S2, no murmurs appreciated.   No LE edema.   Respiratory: Normal respiratory rate and rhythm.  CTAB without rales or wheezes. Abdominal: Abdomen soft and non-tender.  No distension or HSM.   Neuro/Psych: Strength symmetric in upper and lower extremities.  Judgment and insight appear normal .   Condition at discharge: fair  The results of significant diagnostics from this hospitalization (including imaging, microbiology, ancillary and laboratory) are listed below for reference.   Imaging Studies: DG Chest Port 1 View Result Date: 10/11/2023 CLINICAL DATA:  Shortness of breath EXAM: PORTABLE CHEST 1 VIEW COMPARISON:  09/10/2023 FINDINGS: Stable cardiomediastinal silhouette. Diffuse pulmonary interstitial coarsening appears similar to prior and is likely due to chronic interstitial lung disease. No large pleural effusion. No pneumothorax. IMPRESSION: Chronic interstitial lung disease similar to 09/10/2023. Electronically Signed   By: Rozell Cornet M.D.   On: 10/11/2023 21:27    Microbiology: Results for orders placed or performed during the hospital  encounter of 09/10/23  Surgical pcr screen     Status: None   Collection Time: 09/11/23  2:53 AM   Specimen: Nasal Mucosa; Nasal Swab  Result Value Ref Range Status   MRSA, PCR NEGATIVE NEGATIVE Final   Staphylococcus aureus NEGATIVE NEGATIVE Final    Comment: (NOTE) The Xpert SA Assay (FDA approved for NASAL specimens in patients 31 years of age and older), is one component of a comprehensive surveillance program. It is not intended to diagnose infection nor to guide or monitor treatment. Performed at [facility], [address] [place], [place] [address]   MRSA Next Gen by PCR, Nasal     Status: None   Collection Time: 09/12/23  1:05 PM   Specimen: Nasal Mucosa; Nasal Swab  Result Value Ref Range Status   MRSA by PCR Next Gen NOT DETECTED NOT DETECTED Final    Comment: (  NOTE) The GeneXpert MRSA Assay (FDA approved for NASAL specimens only), is one component of a comprehensive MRSA colonization surveillance program. It is not intended to diagnose MRSA infection nor to guide or monitor treatment for MRSA infections. Test performance is not FDA approved in patients less than 82 years old. Performed at [facility], [address], [place], [place] [address]     Labs: CBC: Recent Labs  Lab 10/11/23 2052  WBC 9.1  HGB 15.7  HCT 45.9  MCV 91.6  PLT 236   Basic Metabolic Panel: Recent Labs  Lab 10/11/23 2052 10/12/23 0109  NA 140  --   K 4.2  --   CL 104  --   CO2 24  --   GLUCOSE 124*  --   BUN 30*  --   CREATININE 1.31*  --   CALCIUM  9.3  --   MG  --  2.0   Liver Function Tests: No results for input(s): "AST", "ALT", "ALKPHOS", "BILITOT", "PROT", "ALBUMIN " in the last 168 hours. CBG: Recent Labs  Lab 10/12/23 0108 10/12/23 0604  GLUCAP 115* 113*    Discharge time spent: approximately 45 minutes spent on discharge counseling, evaluation of patient on day of discharge, and coordination of discharge planning with nursing, social work,  pharmacy and case management  Signed: Ephriam Hashimoto, MD [facility] 10/12/2023

## 2023-10-12 NOTE — Hospital Course (Addendum)
 73 y.o. M with obesity, CAD s/p remote PCI, Aflutter, ILD, OSA, HTN, DM and CKD IIIb 1.3-1.6 who presented with tachycardia.    In the ER, ECG showed rapid Afib. Started on diltiazem  without effect.  Hemodynamically stable. Cardiology were consulted and recommended DCCV.

## 2023-10-12 NOTE — Care Management Obs Status (Signed)
 MEDICARE OBSERVATION STATUS NOTIFICATION   Patient Details  Name: Gary Keller MRN: [id number] Date of Birth: August 05, 1950   Medicare Observation Status Notification Given:  Yes    Joanette Moynahan, RN 10/12/2023, 11:30 AM

## 2023-10-13 ENCOUNTER — Other Ambulatory Visit: Payer: Self-pay

## 2023-10-13 ENCOUNTER — Ambulatory Visit: Attending: Cardiology | Admitting: Cardiology

## 2023-10-13 ENCOUNTER — Encounter: Payer: Self-pay | Admitting: Cardiology

## 2023-10-13 VITALS — BP 118/71 | HR 88 | Ht 62.0 in

## 2023-10-13 DIAGNOSIS — I10 Essential (primary) hypertension: Secondary | ICD-10-CM

## 2023-10-13 DIAGNOSIS — I2510 Atherosclerotic heart disease of native coronary artery without angina pectoris: Secondary | ICD-10-CM | POA: Diagnosis not present

## 2023-10-13 DIAGNOSIS — J849 Interstitial pulmonary disease, unspecified: Secondary | ICD-10-CM | POA: Diagnosis not present

## 2023-10-13 DIAGNOSIS — E785 Hyperlipidemia, unspecified: Secondary | ICD-10-CM

## 2023-10-13 DIAGNOSIS — I483 Typical atrial flutter: Secondary | ICD-10-CM

## 2023-10-13 MED ORDER — ATORVASTATIN CALCIUM 80 MG PO TABS: 80.0000 mg | ORAL_TABLET | Freq: Every day | ORAL | 3 refills | Status: AC

## 2023-10-13 NOTE — Progress Notes (Signed)
 Electrophysiology Office Note:   Date:  10/13/2023  ID:  Jariah Jarmon, New  [birth year], MRN [id number]  Primary Cardiologist: Alexandria Angel, MD Electrophysiologist: None      History of Present Illness:   Gary Keller is a 73 y.o. male with h/o paroxysmal atrial fibrillation, HTN, coronary artery disease status post PCI in 2010, type 2 diabetes, HTN, interstitial lung disease, HLD, OSA, CKD stage III who is being seen today for evaluation of his atrial fibrillaition.  Discussed the use of AI scribe software for clinical note transcription with the patient, who gave verbal consent to proceed.  History of Present Illness He presented to the emergency department on 09/11/2023 with palpitations that had been present for the prior 48 hours. He noted some chest pressure. In the emergency department he was noted to have atrial flutter with RVR. He was started on Cardizem  gtt. His creatinine was elevated at 1.6. His cardiac troponins were noted to be 127 and then 108. Cardiology was consulted. He underwent DCCV on 09/12/2023. After conversion he became hypotensive. He required IV fluids. He required albumin  and phenylephrine . Had recurrence of AFL on 5/28 and presented to ED. He was given IV amiodarone and converted to sinus. He was discharged on dronedarone due to concerns of amiodarone use in setting of his lung disease. During this most recent episode, he felt neck pain and discomfort across his shoulders, and his heart rate was elevated at 163 bpm. He describes feeling 'a little tired' during these episodes but has no significant shortness of breath, dizziness, or lightheadedness. He has a history of coronary artery disease with three stents placed.  He is on three liters of oxygen continuously and uses CPAP at night. His oxygen requirements have been stable, although he increases the flow when engaging in outdoor activities.   Review of systems complete and found to be negative unless listed  in HPI.   EP Information / Studies Reviewed:    EKG is not ordered today. EKG from 10/11/23 reviewed which showed typical AFL.    TEE 09/12/23:  1. Patient was tachycardic in the 160s throughout the procedure.   2. Left ventricular ejection fraction, by estimation, is 55 to 60%. The  left ventricle has normal function. The left ventricle has no regional  wall motion abnormalities.   3. Right ventricular systolic function is mildly reduced. The right  ventricular size is normal.   4. No left atrial/left atrial appendage thrombus was detected. The LAA  emptying velocity was 102 cm/s.   5. The mitral valve is normal in structure. Trivial mitral valve  regurgitation. No evidence of mitral stenosis.   6. The aortic valve is tricuspid. Aortic valve regurgitation is not  visualized. No aortic stenosis is present.   7. The inferior vena cava is normal in size with greater than 50%  respiratory variability, suggesting right atrial pressure of 3 mmHg.   Echo 09/11/23:  1. Left ventricular ejection fraction, by estimation, is 60 to 65%. The  left ventricle has normal function. The left ventricle has no regional  wall motion abnormalities. Left ventricular diastolic function could not  be evaluated due to atrial flutter.   2. Right ventricular systolic function is mildly reduced. The right  ventricular size is normal. Visually right ventricular wall thickness is  above normal limits. There is normal pulmonary artery systolic pressure.  The estimated right ventricular  systolic pressure is 32.4 mmHg.   3. The mitral valve is normal in structure. Mild  to moderate mitral valve  regurgitation. No evidence of mitral stenosis.   4. The aortic valve is tricuspid. Aortic valve regurgitation is not  visualized. Aortic valve sclerosis is present, with no evidence of aortic  valve stenosis.   Risk Assessment/Calculations:    CHA2DS2-VASc Score = 3   This indicates a 3.2% annual risk of stroke. The  patient's score is based upon: CHF History: 0 HTN History: 1 Diabetes History: 0 Stroke History: 0 Vascular Disease History: 1 Age Score: 1 Gender Score: 0             Physical Exam:   VS:  BP 118/71   Pulse 88   Ht 5\' 2"  (1.575 m)   SpO2 96% Comment: 3L  BMI 33.47 kg/m    Wt Readings from Last 3 Encounters:  10/11/23 182 lb 15.7 oz (83 kg)  10/03/23 183 lb 9.6 oz (83.3 kg)  09/11/23 182 lb 1.6 oz (82.6 kg)     GEN: Well nourished, well developed in no acute distress NECK: No JVD CARDIAC: Normal rate, regular rhythm RESPIRATORY:  Clear to auscultation without rales, wheezing or rhonchi  ABDOMEN: Soft, non-distended EXTREMITIES:  No edema; No deformity   ASSESSMENT AND PLAN:    #. Typical atrial flutter, symptomatic:  -Discussed treatment options today for AFL including antiarrhythmic drug therapy and ablation. Discussed risks, recovery and likelihood of success with each treatment strategy. Risk, benefits, and alternatives to EP study and ablation for afib were discussed. These risks include but are not limited to stroke, bleeding, vascular damage, tamponade, perforation, damage to the esophagus, lungs, phrenic nerve and other structures, pulmonary vein stenosis, worsening renal function, coronary vasospasm and death.  Discussed potential need for repeat ablation procedures and antiarrhythmic drugs after an initial ablation. The patient understands these risk and wishes to proceed.  We will therefore proceed with catheter ablation at the next available time.  Carto, ICE, anesthesia are requested for the procedure.  May need to do procedure under MAC sedation to avoid intubation in setting of lund disease, will defer to anesthesia team.  -Continue dronedarone  400mg  BID as bridge to ablation.  -Continue metoprolol  XL 50mg  daily.   #. 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  - Continue Eliquis  5mg  BID.   #. CAD: Denies chest pain.   -Continue atorvastatin  80mg  daily.  -Not on aspirin  due to Eliquis .   #Hypertension -At goal today.  Recommend checking blood pressures 1-2 times per week at home and recording the values.  Recommend bringing these recordings to the primary care physician.  Follow up with Dr. Daneil Dunker 3 months after ablation.   Signed, Ardeen Kohler, MD

## 2023-10-13 NOTE — Patient Instructions (Signed)
 Medication Instructions:  Your physician recommends that you continue on your current medications as directed. Please refer to the Current Medication list given to you today.  *If you need a refill on your cardiac medications before your next appointment, please call your pharmacy*   Lab Work: Pre procedure labs -- we will call you to schedule:  BMP & CBC  If you have a lab test that is abnormal and we need to change your treatment, we will call you to review the results -- otherwise no news is good news.    Testing/Procedures: Your physician has recommended that you have an ablation. Catheter ablation is a medical procedure used to treat some cardiac arrhythmias (irregular heartbeats). During catheter ablation, a long, thin, flexible tube is put into a blood vessel in your groin (upper thigh), or neck. This tube is called an ablation catheter. It is then guided to your heart through the blood vessel. Radio frequency waves destroy small areas of heart tissue where abnormal heartbeats may cause an arrhythmia to start.   Your ablation is scheduled for 12/29/2023. Please arrive at [facility] at 10:30 am.  We will call/send instructions at a later date.   Follow-Up: At [facility], you and your health needs are our priority.  As part of our continuing mission to provide you with exceptional heart care, we have created designated Provider Care Teams.  These Care Teams include your primary Cardiologist (physician) and Advanced Practice Providers (APPs -  Physician Assistants and Nurse Practitioners) who all work together to provide you with the care you need, when you need it.  Your next appointment:   1 month(s) after your ablation  The format for your next appointment:   In Person  Provider:   Dr. Daneil Dunker or team   Thank you for choosing [facility]!!   [phone]    Other Instructions   Cardiac Ablation Cardiac ablation is a procedure to destroy (ablate) some  heart tissue that is sending bad signals. These bad signals cause problems in heart rhythm. The heart has many areas that make these signals. If there are problems in these areas, they can make the heart beat in a way that is not normal. Destroying some tissues can help make the heart rhythm normal. Tell your doctor about: Any allergies you have. All medicines you are taking. These include vitamins, herbs, eye drops, creams, and over-the-counter medicines. Any problems you or family members have had with medicines that make you fall asleep (anesthetics). Any blood disorders you have. Any surgeries you have had. Any medical conditions you have, such as kidney failure. Whether you are pregnant or may be pregnant. What are the risks? This is a safe procedure. But problems may occur, including: Infection. Bruising and bleeding. Bleeding into the chest. Stroke or blood clots. Damage to nearby areas of your body. Allergies to medicines or dyes. The need for a pacemaker if the normal system is damaged. Failure of the procedure to treat the problem. What happens before the procedure? Medicines Ask your doctor about: Changing or stopping your normal medicines. This is important. Taking aspirin  and ibuprofen. Do not take these medicines unless your doctor tells you to take them. Taking other medicines, vitamins, herbs, and supplements. General instructions Follow instructions from your doctor about what you cannot eat or drink. Plan to have someone take you home from the hospital or clinic. If you will be going home right after the procedure, plan to have someone with you for  24 hours. Ask your doctor what steps will be taken to prevent infection. What happens during the procedure?  An IV tube will be put into one of your veins. You will be given a medicine to help you relax. The skin on your neck or groin will be numbed. A cut (incision) will be made in your neck or groin. A needle will be  put through your cut and into a large vein. A tube (catheter) will be put into the needle. The tube will be moved to your heart. Dye may be put through the tube. This helps your doctor see your heart. Small devices (electrodes) on the tube will send out signals. A type of energy will be used to destroy some heart tissue. The tube will be taken out. Pressure will be held on your cut. This helps stop bleeding. A bandage will be put over your cut. The exact procedure may vary among doctors and hospitals. What happens after the procedure? You will be watched until you leave the hospital or clinic. This includes checking your heart rate, breathing rate, oxygen, and blood pressure. Your cut will be watched for bleeding. You will need to lie still for a few hours. Do not drive for 24 hours or as long as your doctor tells you. Summary Cardiac ablation is a procedure to destroy some heart tissue. This is done to treat heart rhythm problems. Tell your doctor about any medical conditions you may have. Tell him or her about all medicines you are taking to treat them. This is a safe procedure. But problems may occur. These include infection, bruising, bleeding, and damage to nearby areas of your body. Follow what your doctor tells you about food and drink. You may also be told to change or stop some of your medicines. After the procedure, do not drive for 24 hours or as long as your doctor tells you. This information is not intended to replace advice given to you by your health care provider. Make sure you discuss any questions you have with your health care provider. Document Revised: 07/23/2021 Document Reviewed: 04/04/2019 Elsevier Patient Education  2023 Elsevier Inc.   Cardiac Ablation, Care After  This sheet gives you information about how to care for yourself after your procedure. Your health care provider may also give you more specific instructions. If you have problems or questions, contact  your health care provider. What can I expect after the procedure? After the procedure, it is common to have: Bruising around your puncture site. Tenderness around your puncture site. Skipped heartbeats. If you had an atrial fibrillation ablation, you may have atrial fibrillation during the first several months after your procedure.  Tiredness (fatigue).  Follow these instructions at home: Puncture site care  Follow instructions from your health care provider about how to take care of your puncture site. Make sure you: If present, leave stitches (sutures), skin glue, or adhesive strips in place. These skin closures may need to stay in place for up to 2 weeks. If adhesive strip edges start to loosen and curl up, you may trim the loose edges. Do not remove adhesive strips completely unless your health care provider tells you to do that. If a large square bandage is present, this may be removed 24 hours after surgery.  Check your puncture site every day for signs of infection. Check for: Redness, swelling, or pain. Fluid or blood. If your puncture site starts to bleed, lie down on your back, apply firm pressure to the  area, and contact your health care provider. Warmth. Pus or a bad smell. A pea or small marble sized lump at the site is normal and can take up to three months to resolve.  Driving Do not drive for at least 4 days after your procedure or however long your health care provider recommends. (Do not resume driving if you have previously been instructed not to drive for other health reasons.) Do not drive or use heavy machinery while taking prescription pain medicine. Activity Avoid activities that take a lot of effort for at least 7 days after your procedure. Do not lift anything that is heavier than 5 lb (4.5 kg) for one week.  No sexual activity for 1 week.  Return to your normal activities as told by your health care provider. Ask your health care provider what activities are safe  for you. General instructions Take over-the-counter and prescription medicines only as told by your health care provider. Do not use any products that contain nicotine or tobacco, such as cigarettes and e-cigarettes. If you need help quitting, ask your health care provider. You may shower after 24 hours, but Do not take baths, swim, or use a hot tub for 1 week.  Do not drink alcohol for 24 hours after your procedure. Keep all follow-up visits as told by your health care provider. This is important. Contact a health care provider if: You have redness, mild swelling, or pain around your puncture site. You have fluid or blood coming from your puncture site that stops after applying firm pressure to the area. Your puncture site feels warm to the touch. You have pus or a bad smell coming from your puncture site. You have a fever. You have chest pain or discomfort that spreads to your neck, jaw, or arm. You have chest pain that is worse with lying on your back or taking a deep breath. You are sweating a lot. You feel nauseous. You have a fast or irregular heartbeat. You have shortness of breath. You are dizzy or light-headed and feel the need to lie down. You have pain or numbness in the arm or leg closest to your puncture site. Get help right away if: Your puncture site suddenly swells. Your puncture site is bleeding and the bleeding does not stop after applying firm pressure to the area. These symptoms may represent a serious problem that is an emergency. Do not wait to see if the symptoms will go away. Get medical help right away. Call your local emergency services (911 in the [place] Do not drive yourself to the hospital. Summary After the procedure, it is normal to have bruising and tenderness at the puncture site in your groin, neck, or forearm. Check your puncture site every day for signs of infection. Get help right away if your puncture site is bleeding and the bleeding does not stop  after applying firm pressure to the area. This is a medical emergency. This information is not intended to replace advice given to you by your health care provider. Make sure you discuss any questions you have with your health care provider.

## 2023-10-17 ENCOUNTER — Other Ambulatory Visit: Payer: Self-pay | Admitting: *Deleted

## 2023-10-17 DIAGNOSIS — Z01812 Encounter for preprocedural laboratory examination: Secondary | ICD-10-CM

## 2023-10-17 DIAGNOSIS — I483 Typical atrial flutter: Secondary | ICD-10-CM

## 2023-10-20 ENCOUNTER — Encounter: Payer: Self-pay | Admitting: Cardiology

## 2023-10-25 ENCOUNTER — Telehealth: Payer: Self-pay

## 2023-10-25 NOTE — Telephone Encounter (Signed)
 Copied from CRM [id number]. Topic: Clinical - Order For Equipment >> Oct 24, 2023 11:12 AM Margarette Shawl wrote: Reason for CRM:   Gary Keller, with [facility], is contacting clinic to verify if request for order of CPAP supplies was received. Reviewed chart and advised had not received. Verified fax number with Gary Keller, which was incorrect and provided correct number.   Gary Keller will fax over request today, and is requesting provider sign and date, along with recent OV notes be attached to request when sent back  FX# [phone] PHN# [phone]

## 2023-10-26 NOTE — Telephone Encounter (Signed)
I have not received paperwork yet.  

## 2023-11-01 NOTE — Telephone Encounter (Signed)
 Per Peterson Brandt at [facility] nothing further needed for this patient they have OV sleep study and order

## 2023-11-02 ENCOUNTER — Encounter: Payer: Self-pay | Admitting: Cardiology

## 2023-11-02 MED ORDER — DRONEDARONE HCL 400 MG PO TABS: 400.0000 mg | ORAL_TABLET | Freq: Two times a day (BID) | ORAL | 0 refills | Status: DC

## 2023-11-08 ENCOUNTER — Other Ambulatory Visit (HOSPITAL_BASED_OUTPATIENT_CLINIC_OR_DEPARTMENT_OTHER): Payer: Self-pay

## 2023-11-08 MED ORDER — PREDNISONE 5 MG PO TABS: ORAL_TABLET | ORAL | 1 refills | Status: DC

## 2023-11-15 ENCOUNTER — Other Ambulatory Visit: Payer: Self-pay

## 2023-11-16 ENCOUNTER — Encounter (HOSPITAL_BASED_OUTPATIENT_CLINIC_OR_DEPARTMENT_OTHER): Payer: Self-pay | Admitting: Pulmonary Disease

## 2023-11-20 NOTE — Telephone Encounter (Signed)
 Is there anything we need to do to change supply company

## 2023-11-27 ENCOUNTER — Encounter (HOSPITAL_BASED_OUTPATIENT_CLINIC_OR_DEPARTMENT_OTHER): Payer: Self-pay | Admitting: Pulmonary Disease

## 2023-11-27 ENCOUNTER — Ambulatory Visit (HOSPITAL_BASED_OUTPATIENT_CLINIC_OR_DEPARTMENT_OTHER): Admitting: Pulmonary Disease

## 2023-11-27 VITALS — BP 102/64 | HR 77 | Ht 62.0 in | Wt 180.0 lb

## 2023-11-27 DIAGNOSIS — G4733 Obstructive sleep apnea (adult) (pediatric): Secondary | ICD-10-CM | POA: Diagnosis not present

## 2023-11-27 DIAGNOSIS — J9611 Chronic respiratory failure with hypoxia: Secondary | ICD-10-CM | POA: Diagnosis not present

## 2023-11-27 DIAGNOSIS — J849 Interstitial pulmonary disease, unspecified: Secondary | ICD-10-CM | POA: Diagnosis not present

## 2023-11-27 NOTE — Patient Instructions (Addendum)
 X Continue on 74m gprednisone until ablation In September, drop to 5mg  M/W/F   X Check LFTs today  YOUR PLAN:  -INTERSTITIAL LUNG DISEASE (ILD): Interstitial lung disease is a condition that causes scarring of the lungs, making it difficult to breathe. You will continue taking pirfenidone  and prednisone  5 mg daily until your ablation is completed. After that, we will try to reduce prednisone  to every other day.  -CHRONIC RESPIRATORY FAILURE: Chronic respiratory failure occurs when your lungs cannot get enough oxygen into your blood. Your condition is being managed with POC 4 L  -OBSTRUCTIVE SLEEP APNEA (OSA): Obstructive sleep apnea is a condition where your breathing stops and starts during sleep. It is well-controlled with your CPAP therapy, which you should continue using at 14 cm H2O.  -ATRIAL FIBRILLATION: Atrial fibrillation is an irregular and often rapid heart rate that can lead to poor blood flow. You are scheduled for an ablation on August 15 and should continue taking Multaq  as prescribed.

## 2023-11-27 NOTE — Progress Notes (Signed)
 Subjective:    Patient ID: Gary Keller, male    DOB: 26-Apr-1951, 73 y.o.   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  Due to clinical progression and worsening hypoxia , OV 07/2022 >>empiric trial of prednisone  20 mg for 1 week followed by 10 mg daily.     four-month follow-up. He is accompanied by Gary Keller, his daughter .  Breathing is stable with occasional congestion and increased phlegm noted two weeks ago, which improved with an increased prednisone  dosage. He currently takes 5 mg of prednisone  daily and continues on pirfenidone . He is compliant with CPAP therapy set at 14 cm H2O, with no missed nights of use, but faces issues with receiving CPAP supplies due to complications between his insurance and the supply company.   Significant tests/ events reviewed   Serology neg 06/2016 PFT   04/2022 >> FVC 54%, DLCO 9.4/47% 02/2021 >>  FVC 63%, DLCO 12.8/64%   02/2020 >> stable, FVC 64% DLCO 78%   12/2018 >> FVC 66%, TLC 72%, DLCO 64% 02/2018 FVC 66%   05/2017 ratio 88, FVC 71%, DLCO 76%   01/06/17: FVC 2.29 L (67%) FEV1 2.03 L (81%) FEV1/FVC 0.88   DLCO corrected 75%   06/24/16: FVC 2.49 L (76%) FEV1 2.21 L (91%) FEV1/FVC 0.89  negative bronchodilator response TLC 3.77 L (69%)  DLCO uncorrected 66%   01/06/17:  Walked 412 meters / Baseline Sat 99% on RA / Nadir Sat 90% on RA @ end of test     PSG(06/07/16):  AHI 15.0 events/hour., nadir satn 78%   >> Optimal  CPAP pressure 14 cm      HRCT 04/2023 slightly progressive from 05/03/2022>> fibrotic HP   HRCT 04/2022 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 No appreciable interval progression since 06/29/2021 CT. Mild progression since baseline 05/31/2016      HRCT 06/2021 unchanged fibrosis alternative HRCT 06/2020 UIP, unchanged compared to 2021, air trapping raises question of chronic hypersensitivity pneumonitis   HRCT 02/2019  c/w UIP , air trapping raises concern for HP, minimally worse compared to 2018   HRCT CHEST W/O 05/2016 UIP pattern.  Subcarinal and precarinal lymph nodes measuring up to 1.1 & 1.2 cm in short axis. 1.1 CM oblong nodule within left lower lobe      CT CHEST W/ 04/2016 : 9 mm left lower lobe nodule. Diffuse interlobular septal thickening with architectural distortion inferiorly with honeycombing versus bronchiectasis and medial right lower lobe.     CT CHEST W/ 07/2007:. Irregular interstitial prominence in addition. Ill-defined cystic lucencies diffusely. Fracture of left 10th rib noted.     Review of Systems neg for any significant sore throat, dysphagia, itching, sneezing, nasal congestion or excess/ purulent secretions, fever, chills, sweats, unintended wt loss, pleuritic or exertional cp, hempoptysis, orthopnea pnd or change in chronic leg swelling. Also denies presyncope, palpitations, heartburn, abdominal pain, nausea, vomiting, diarrhea or change in bowel or urinary habits, dysuria,hematuria,  rash, arthralgias, visual complaints, headache, numbness weakness or ataxia.     Objective:   Physical Exam  Gen. Pleasant, obese, in no distress, normal affect ENT - no pallor,icterus, no post nasal drip, class 2 airway Neck: No JVD, no thyromegaly, no carotid bruits Lungs: no use of accessory muscles, no dullness to percussion, BB dry rales   Cardiovascular: Rhythm regular, heart sounds  normal, no murmurs or gallops, no peripheral  edema Abdomen: soft and non-tender, no hepatosplenomegaly, BS normal. Musculoskeletal: No deformities, no cyanosis or clubbing Neuro:  alert, non focal, no tremors        Assessment & Plan:   Interstitial Lung Disease (ILD) ILD is managed with pirfenidone . Prednisone  was reduced to every other day, but due to increased symptoms, he resumed daily dosing. Symptoms improved with this adjustment. - Continue pirfenidone . - Continue prednisone  5 mg daily until ablation is completed, then attempt to reduce to every other day.  Chronic Respiratory Failure Chronic respiratory failure is managed with CPAP therapy, which has improved somnolence and fatigue. There are no residual events or significant leaks with the current CPAP settings. - Continue POC 4L - purchased [employer]  Obstructive Sleep Apnea (OSA) OSA is well-controlled with CPAP therapy set at 14 cm H2O. He is compliant with the CPAP, using it every night. Weight loss has contributed positively to the management of OSA. - Continue CPAP therapy at 14 cm H2O.  Atrial Fibrillation Atrial fibrillation episodes have led to ER visits. He is currently on Multaq  and is scheduled for an ablation on August 15. He reports feeling good and not bothered by symptoms since starting Multaq . - Proceed with scheduled ablation on August 15. - Continue Multaq .

## 2023-11-28 ENCOUNTER — Ambulatory Visit: Payer: Self-pay | Admitting: Pulmonary Disease

## 2023-11-28 LAB — HEPATIC FUNCTION PANEL
ALT: 16 IU/L (ref 0–44)
AST: 16 IU/L (ref 0–40)
Albumin: 4.2 g/dL (ref 3.8–4.8)
Alkaline Phosphatase: 107 IU/L (ref 44–121)
Bilirubin Total: 0.4 mg/dL (ref 0.0–1.2)
Bilirubin, Direct: 0.21 mg/dL (ref 0.00–0.40)
Total Protein: 6.8 g/dL (ref 6.0–8.5)

## 2023-12-01 ENCOUNTER — Telehealth: Payer: Self-pay

## 2023-12-01 NOTE — Telephone Encounter (Signed)
 Called patient to schedule a date for him to have labs done prior to his upcoming ablation in August. NA, left message to call back amd also sent a Mychart message.

## 2023-12-12 ENCOUNTER — Encounter: Payer: Self-pay | Admitting: Cardiology

## 2023-12-13 ENCOUNTER — Other Ambulatory Visit: Payer: Self-pay | Admitting: *Deleted

## 2023-12-13 DIAGNOSIS — I4892 Unspecified atrial flutter: Secondary | ICD-10-CM

## 2023-12-13 MED ORDER — APIXABAN 5 MG PO TABS: 5.0000 mg | ORAL_TABLET | Freq: Two times a day (BID) | ORAL | 10 refills | Status: AC

## 2023-12-13 NOTE — Telephone Encounter (Signed)
 Eliquis  5mg  refill request received. Patient is 73 years old, weight-81.6kg, Crea-1.31 on 10/11/23, Diagnosis-Aflutter, and last seen by Dr. Kennyth on 10/13/23. Dose is appropriate based on dosing criteria. Will send in refill to requested pharmacy.

## 2023-12-15 LAB — CBC

## 2023-12-16 LAB — CBC
Hematocrit: 41.8 % (ref 37.5–51.0)
Hemoglobin: 14 g/dL (ref 13.0–17.7)
MCH: 31 pg (ref 26.6–33.0)
MCHC: 33.5 g/dL (ref 31.5–35.7)
MCV: 93 fL (ref 79–97)
Platelets: 228 x10E3/uL (ref 150–450)
RBC: 4.51 x10E6/uL (ref 4.14–5.80)
RDW: 12.1 % (ref 11.6–15.4)
WBC: 8.7 x10E3/uL (ref 3.4–10.8)

## 2023-12-16 LAB — BASIC METABOLIC PANEL WITH GFR
BUN/Creatinine Ratio: 20 (ref 10–24)
BUN: 30 mg/dL — ABNORMAL HIGH (ref 8–27)
CO2: 23 mmol/L (ref 20–29)
Calcium: 9.2 mg/dL (ref 8.6–10.2)
Chloride: 101 mmol/L (ref 96–106)
Creatinine, Ser: 1.48 mg/dL — ABNORMAL HIGH (ref 0.76–1.27)
Glucose: 79 mg/dL (ref 70–99)
Potassium: 4.1 mmol/L (ref 3.5–5.2)
Sodium: 140 mmol/L (ref 134–144)
eGFR: 49 mL/min/1.73 — ABNORMAL LOW (ref >59)

## 2023-12-16 IMAGING — CT CT CHEST HIGH RESOLUTION
2 of 7 series · 14 of 36 positions shown, 17 images · non-contrast
Comparison: Chest CT 07/09/2020.

CLINICAL DATA: 70-year-old male with history of pulmonary fibrosis.



[Series 4: high resolution · axial · 0.76mm/px · z∈[-308,-48]mm · 11 of 314 slices shown, 14 images]
[im 27/314  mediastinal]
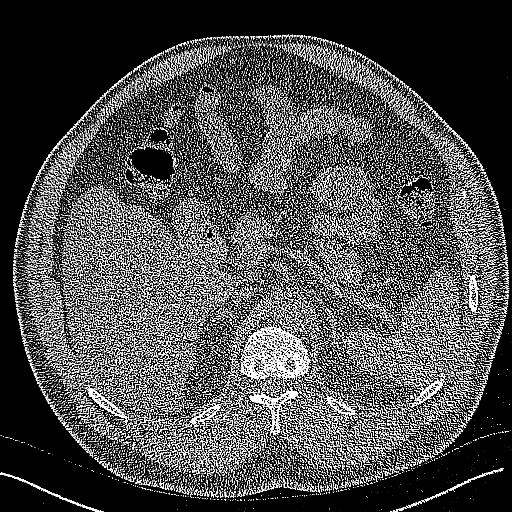
[im 27/314  lung]
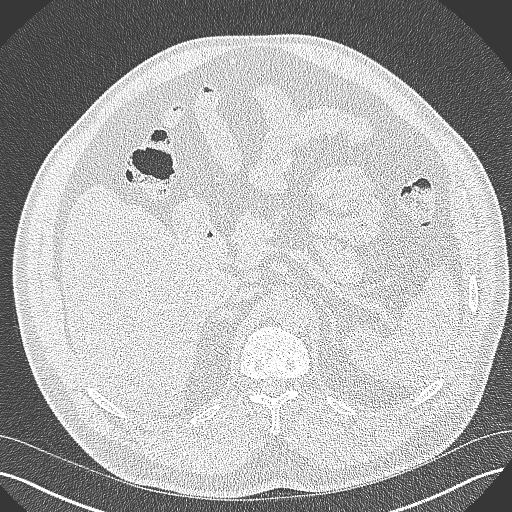
[im 53/314  lung]
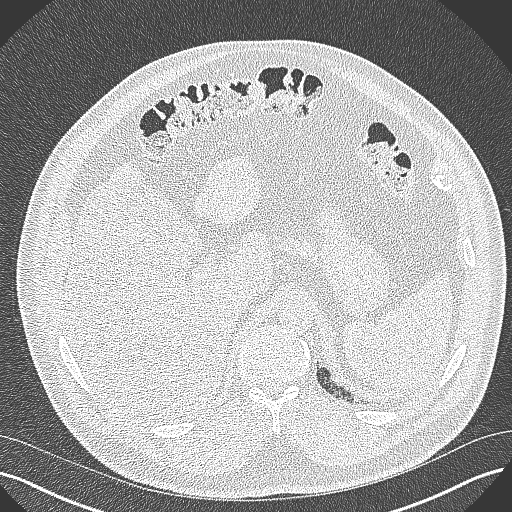
[im 79/314  lung]
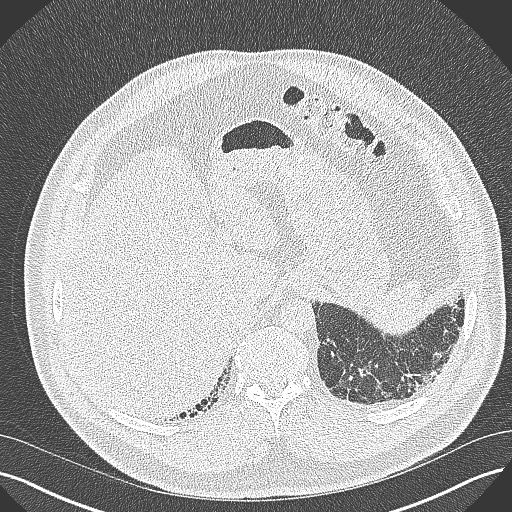
[im 105/314  lung]
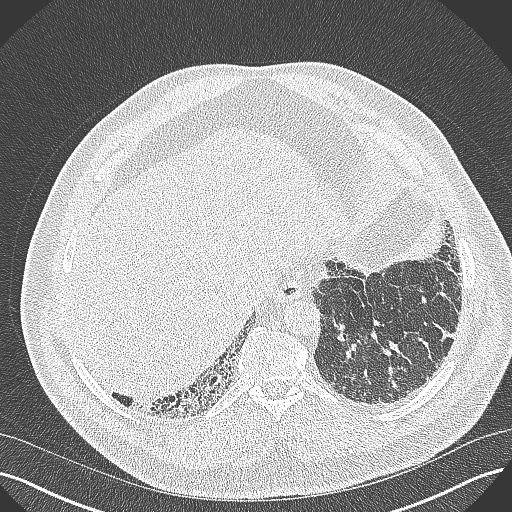
[im 131/314  mediastinal]
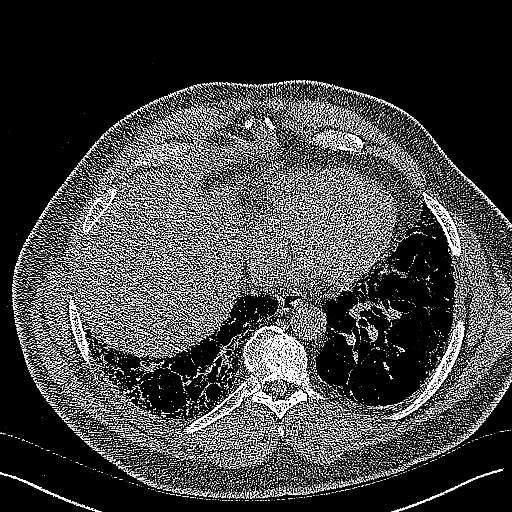
[im 131/314  lung]
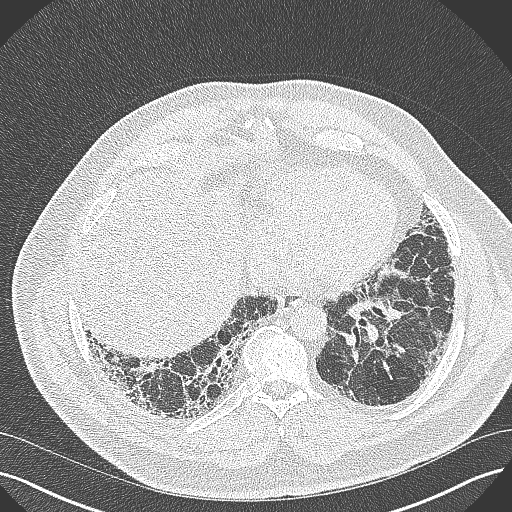
[im 157/314  lung]
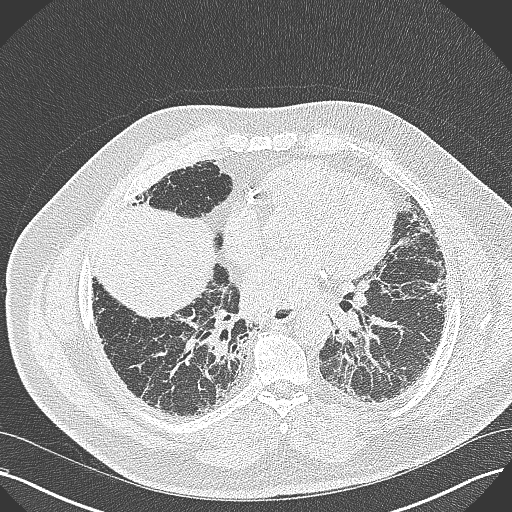
[im 183/314  lung]
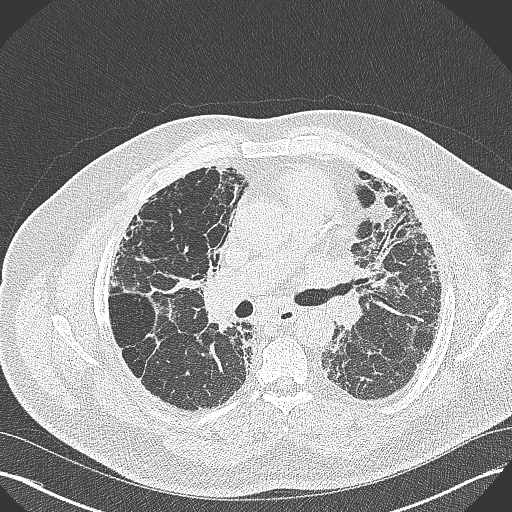
[im 209/314  lung]
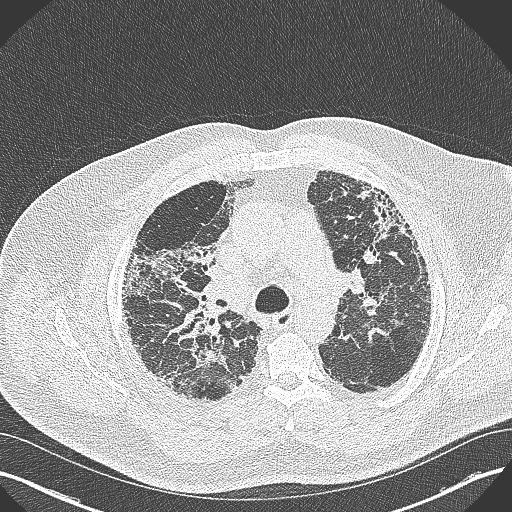
[im 235/314  mediastinal]
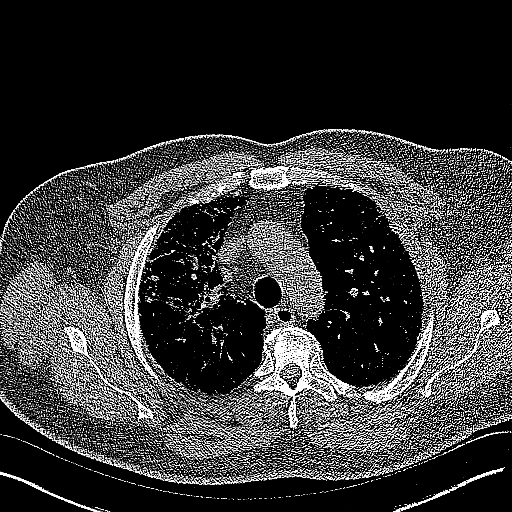
[im 235/314  lung]
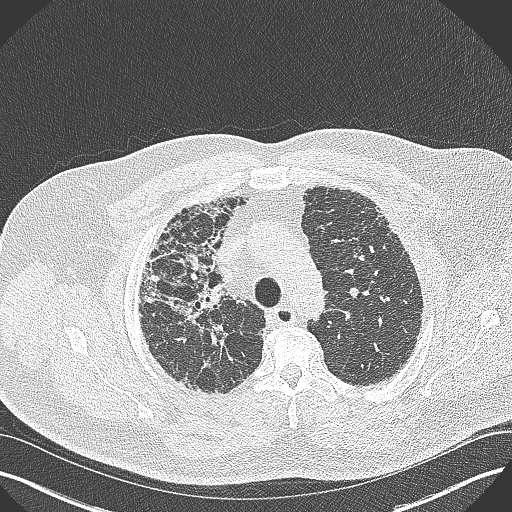
[im 261/314  lung]
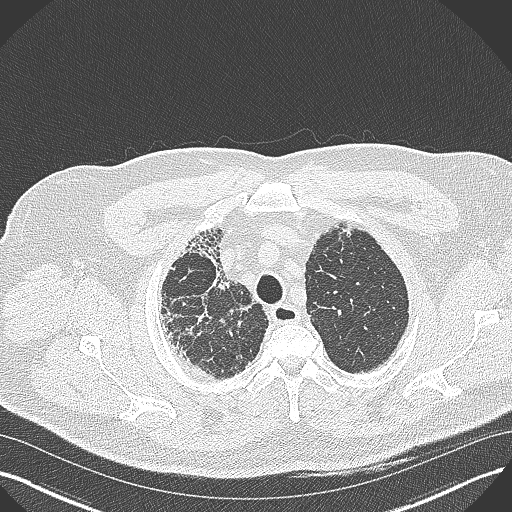
[im 287/314  lung]
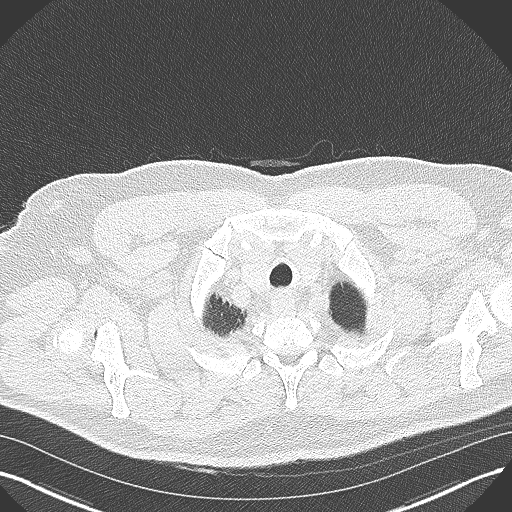

[Series 6: coronal · coronal · 0.62mm/px · 3 of 128 slices shown]
[im 26/128  lung]
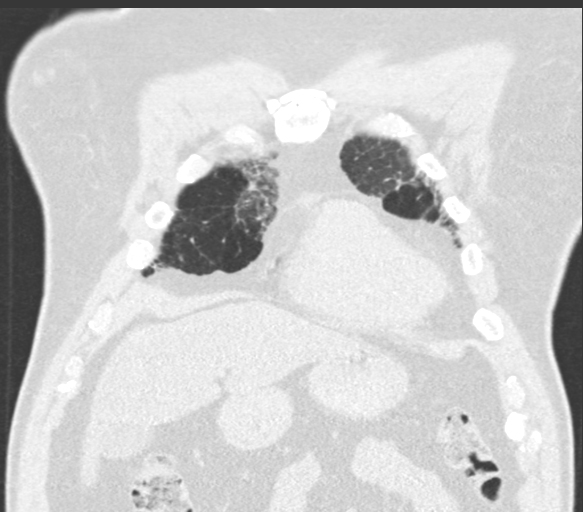
[im 51/128  lung]
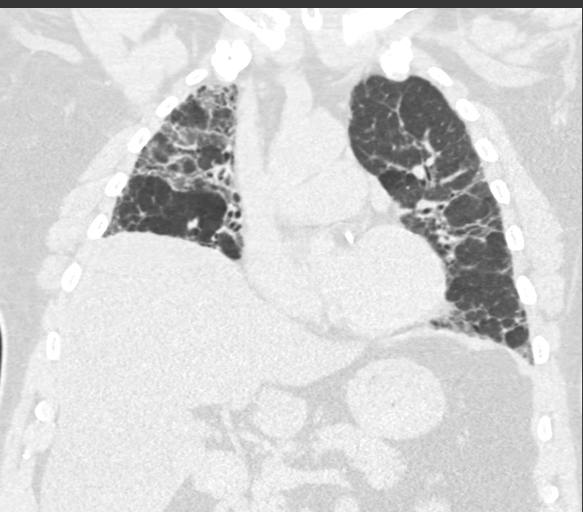
[im 77/128  lung]
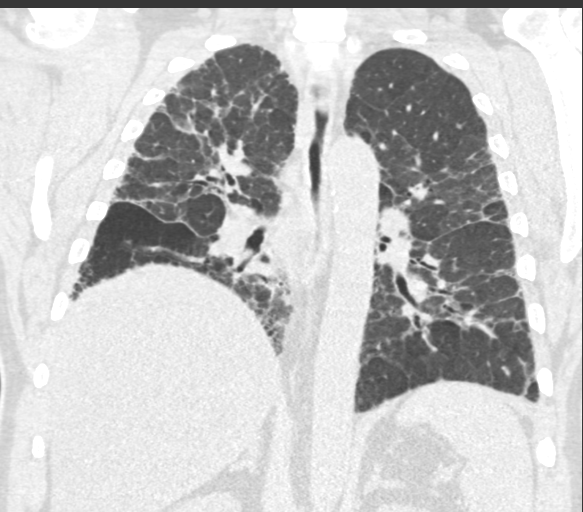

[14 of 36 positions shown; findings below may reference images not displayed]

FINDINGS: Cardiovascular: Heart size is normal. There is no significant
pericardial fluid, thickening or pericardial calcification. There is
aortic atherosclerosis, as well as atherosclerosis of the great
vessels of the mediastinum and the coronary arteries, including
calcified atherosclerotic plaque in the left main, left anterior
descending, left circumflex and right coronary arteries. Mild
calcification of the aortic valve.

Mediastinum/Nodes: Mediastinal or no pathologically enlarged hilar
lymph nodes. Please note that accurate exclusion of hilar adenopathy
is limited on noncontrast CT scans. Esophagus is unremarkable in
appearance. No axillary lymphadenopathy.

Lungs/Pleura: High-resolution images again demonstrate widespread
but patchy areas of ground-glass attenuation, septal thickening,
subpleural reticulation, thickening of the peribronchovascular
interstitium, cylindrical bronchiectasis, peripheral
bronchiolectasis and scattered areas of honeycombing. There is no
discernible craniocaudal gradient, and findings are rather
asymmetric, with relative sparing of some portions of the lung bases
(axial image 108 of series 3 demonstrates relative sparing in the
left lower lobe compared to the contralateral lung). Overall, there
has been little change dating back to prior examinations from 8836.
Inspiratory and expiratory imaging again demonstrates moderate air
trapping indicative of small airways disease. No acute consolidative
airspace disease. No pleural effusions. No definite suspicious
appearing pulmonary nodules or masses are noted.

Upper Abdomen: Diffuse low attenuation throughout the visualized
hepatic parenchyma, indicative of a background of severe hepatic
steatosis. Aortic atherosclerosis.

Musculoskeletal: There are no aggressive appearing lytic or blastic
lesions noted in the visualized portions of the skeleton.
IMPRESSION: 1. Persistent and relatively stable appearance of chronic
interstitial lung disease, with a spectrum of findings which given
the stability over the prior 5 years, presence of air trapping, and
lack of a clearly definable craniocaudal gradient is favored to
represent an alternative diagnosis (not usual interstitial
pneumonia). Specifically, findings are favored to reflect chronic
hypersensitivity pneumonitis.
2. Aortic atherosclerosis, in addition to left main and three-vessel
coronary artery disease. Assessment for potential risk factor
modification, dietary therapy or pharmacologic therapy may be
warranted, if clinically indicated.
3. There are calcifications of the aortic valve. Echocardiographic
correlation for evaluation of potential valvular dysfunction may be
warranted if clinically indicated.
4. Hepatic steatosis.

Aortic Atherosclerosis ([id number]).

## 2023-12-17 ENCOUNTER — Ambulatory Visit: Payer: Self-pay | Admitting: Cardiology

## 2023-12-22 NOTE — Telephone Encounter (Signed)
 Labs were done on December 16, 2023.

## 2023-12-25 ENCOUNTER — Telehealth (HOSPITAL_COMMUNITY): Payer: Self-pay

## 2023-12-25 NOTE — Telephone Encounter (Signed)
 Spoke with patient to discuss upcoming procedure.   Labs: completed.   Any recent signs of acute illness or been started on antibiotics? No Any new medications started? No Any medications to hold?  Hold Mounjaro for 1 week prior to the procedure. Last dose on August 03. Any missed doses of blood thinner?  No Advised patient to continue taking ANTICOAGULANT: Eliquis  (Apixaban ) twice daily without missing any doses.  Medication instructions:  On the morning of your procedure DO NOT take any medication., including Eliquis  or the procedure may be rescheduled. Nothing to eat or drink after midnight prior to your procedure.  Confirmed patient is scheduled for Atrial Flutter Ablation on Friday, August 15 with Dr. Sidra Kitty. Instructed patient to arrive at the Main Entrance A at [facility]: [address] [place], [place] [address] and check in at Admitting at 10:30 AM.   Advised of plan to go home the same day and will only stay overnight if medically necessary. You MUST have a responsible adult to drive you home and MUST be with you the first 24 hours after you arrive home or your procedure could be cancelled.  Patient verbalized understanding to all instructions provided and agreed to proceed with procedure.

## 2023-12-26 NOTE — Progress Notes (Signed)
 Medicare AWV  Gary Urwin [facility] Sr. is a 73 y.o. male who presents for his subsequent annual wellness visit for Medicare.  Clinical documentation was reviewed and is accessible via encounter-level attachments.    Any physical exam components or additional concerns beyond the scope of the Annual Wellness Visit may be documented in a separate note within this encounter.  Patient ID:  Gary Pons Sr. is a 73 y.o. (DOB 30-Mar-1951) male.  Assessment and Plan   1. Encounter for subsequent annual wellness visit (AWV) in Medicare patient   2. DM type 2 with diabetic dyslipidemia (*)   3. Idiopathic pulmonary fibrosis (*)   4. Essential hypertension   5. Atrial fibrillation, unspecified type (*)   6. OSA (obstructive sleep apnea)     Assessment & Plan AWV All components completed today health maintenance issues up-to-date   1. Atrial Fibrillation: Stable. - Scheduled for an ablation procedure on 12/29/2023. - Compliance with current medication regimen, including Eliquis . - No changes in medication; continue current regimen.  2. Hypertension: Stable. - Currently taking amlodipine  5 mg and Toprol  (metoprolol ) 50 mg. - Compliance with current medications. - No changes in medication; continue current regimen.  3. Hyperlipidemia: Stable. - Currently taking Lipitor 80 mg and fenofibrate  160 mg. - Compliance with current medications. - No changes in medication; continue current regimen. Cholesterol levels up-to-date and excellent  4. Anxiety: Stable. - Doing well on Effexor  for anxiety. - Compliance with current dosage. - No changes in medication; continue current regimen.    6. Weight Management: Stable. - On Mounjaro 12.5 mg weekly injections. - Weight down to 168 lbs; reports no side effects. - Feels more energetic. - No changes in medication; continue current regimen.  7. Health Maintenance. - Colonoscopy and vaccines are up to date. - Prostate level screening no  longer necessary per urologist. - Urine sample to be collected today for analysis.  Follow-up: Next scheduled visit in 3 months (03/2024).   Results    HM needs: urine alb  Patient verbalized to me that they understood what their problem is, what they need to do about it and why it is important that they do it. I have reviewed the information contained in this note and personally verified its accuracy.  I obtained the history of present illness and personally performed the physical exam Voice recognition software was used in creation of this note . Despite my best efforts at editing the text, some misspellings / errors  may have occurred.    Patient understands and agrees with plan. Computer technology was used to create visit note. Consent from the patient/caregiver was obtained prior to use.  Follow up in about 3 months (around 03/27/2024).    Patient's Medications       * Accurate as of December 26, 2023  9:43 AM. Reflects encounter med changes as of last refresh          Continued Medications      Instructions  amLODIPine  besylate 5 mg tablet Commonly known as: NORVASC   5 mg, Oral, Daily   apixaban  5 mg tablet Commonly known as: ELIQUIS   5 mg, 2 times a day   aspirin  EC tablet Commonly known as: ECOTRIN LOW DOSE  81 mg, Daily   fenofibrate  160 mg tablet Commonly known as: TRIGLIDE   TAKE 1 TABLET(160 MG) BY MOUTH DAILY   LIPITOR 80 mg tablet Generic drug: atorvastatin   10 mg, Daily   metoprolol  succinate 50 mg 24 hr tablet Commonly known as:  TOPROL -XL  50 mg, Oral, Daily   MOUNJARO 12.5 MG/0.5ML Soaj injection Generic drug: tirzepatide  12.5 mg, Subcutaneous, Weekly   nintedanib esylate  150 mg capsule Commonly known as: OFEV   150 mg, 2 times a day   predniSONE  5 mg tablet Commonly known as: DELTASONE   5 mg, Daily   VASCEPA  1 g capsule Generic drug: icosapent  ethyl  2 g, 2 times a day   venlafaxine  HCl 75 mg 24 hr capsule Commonly known as:  EFFEXOR -XR  75 mg, Oral, Daily        Orders Placed This Encounter  Procedures  . Albumin /Creatinine Ratio, Random Urine    Risks, benefits, and alternatives of the medications and treatment plan prescribed today were discussed, and patient expressed understanding. Plan follow-up as discussed or as needed if any worsening symptoms or change in condition.    A yearly preventative health exam was recommended and current age based recommendations were discussed.   Subjective   Patient ID:  Gary Gehring Sr. is a 73 y.o. (DOB August 16, 1950) male    Patient presents with  . Medicare Wellness Visit    Fasting.      HPI:    History of Present Illness The patient is here for an annual wellness visit and to follow up on chronic issues. He is overall doing well. He is also planning to schedule a dental appointment soon. He does not consume alcohol and maintains an active lifestyle through walking. He undergoes annual skin checks with a dermatologist.    Additional complaints beyond the scope of the Wellness Exam include:   See below      He reports feeling well overall, with no symptoms of atrial fibrillation or chest pain. He has been adhering to his medication regimen without any side effects. His current medications include amlodipine  5 mg, fenofibrate  160 mg, metoprolol  50 mg, Mounjaro 12.5 mg, Effexor  for anxiety, prednisone  5 mg (taken 4 days a week with a break on Mondays), Lipitor 80 mg, and Eliquis . He is scheduled for an ablation procedure on Friday.  He is up-to-date with his pulmonologist his breathing is stable he is on continuous oxygen  He has an ablation scheduled for Friday with his cardiologist   He has experienced weight loss, which he attributes to increased energy levels. His respiratory function remains stable, and he is under the care of a pulmonologist.  Alcohol: He does not consume alcohol.    Past Medical History, Past Surgery History, Allergies, Social  History, and Family History were reviewed and updated.    Review of Systems   Objective   Vitals:   12/26/23 0915  BP: 110/60  Patient Position: Sitting  Pulse: 73  Temp: 97.2 F (36.2 C)  TempSrc: Skin  Resp: 18  Height: 5' 2 (1.575 m)  Weight: 168 lb 12.8 oz (76.6 kg)  SpO2: 97% Comment: on 3L of oxygen  BMI (Calculated): 30.9  PainSc: 0-No pain   Diabetic foot exam:  Left: Monofilament test: Sensation normal  Pulses: normal and present  Skin: Normal and no erythema, no cyanosis or pallor   Other findings: none Right: Monofilament test: Sensation normal  Pulses: normal and present  Skin: Normal and no erythema, no cyanosis or pallor   Other findings: none Exam performed with shoes and socks removed.    Physical Exam Vitals and nursing note reviewed.  Constitutional:      General: He is not in acute distress.    Appearance: Normal appearance. He is well-developed. He  is obese. He is not ill-appearing, toxic-appearing or diaphoretic.     Comments: On oxygen   HENT:     Head: Normocephalic and atraumatic.     Right Ear: Tympanic membrane, ear canal and external ear normal. There is no impacted cerumen.     Left Ear: Tympanic membrane, ear canal and external ear normal. There is no impacted cerumen.     Nose: Nose normal. No congestion or rhinorrhea.     Mouth/Throat:     Mouth: Mucous membranes are moist.     Pharynx: Oropharynx is clear. No oropharyngeal exudate or posterior oropharyngeal erythema.  Eyes:     General: No scleral icterus.       Right eye: No discharge.        Left eye: No discharge.     Extraocular Movements: Extraocular movements intact.     Conjunctiva/sclera: Conjunctivae normal.     Pupils: Pupils are equal, round, and reactive to light.  Cardiovascular:     Rate and Rhythm: Normal rate and regular rhythm.     Heart sounds: Normal heart sounds. No murmur heard.    No friction rub. No gallop.  Musculoskeletal:        General: Normal  range of motion.     Cervical back: Normal range of motion and neck supple. No rigidity.  Pulmonary:     Effort: Pulmonary effort is normal. No respiratory distress.     Breath sounds: Normal breath sounds. No stridor. No wheezing, rhonchi or rales.  Chest:     Chest wall: No tenderness.  Abdominal:     General: Abdomen is flat.     Palpations: There is no mass.     Tenderness: There is no right CVA tenderness, left CVA tenderness, guarding or rebound.     Hernia: No hernia is present.  Lymphadenopathy:     Cervical: No cervical adenopathy.  Skin:    General: Skin is warm and dry.  Neurological:     Mental Status: He is alert and oriented to person, place, and time. Mental status is at baseline.     Deep Tendon Reflexes: Reflexes are normal and symmetric.  Psychiatric:        Mood and Affect: Mood normal.        Behavior: Behavior normal.        Thought Content: Thought content normal.        Judgment: Judgment normal.       Voice recognition software was used and creation of this note. Despite my best effort at editing the text, some misspelling/errors may have occurred.   12/26/23 0915 : 168 lb 12.8 oz (76.6 kg) 09/25/23 0828 : 176 lb 3.2 oz (79.9 kg) 06/27/23 0751 : 178 lb 12.8 oz (81.1 kg) 03/22/23 0818 : 186 lb 3.2 oz (84.5 kg) 02/14/23 0811 : 199 lb 9.6 oz (90.5 kg) 01/11/23 1012 : 211 lb 3.2 oz (95.8 kg)   Medicare Required Components     Reviewed and updated this visit by provider: Tobacco  Allergies  Meds  Problems  Med Hx  Surg Hx  Fam Hx       Medicare required assessment: Future risk of substance use disorder / overdose risk of 20% is higher (>=20%).    Patient Care Team: Camie JAYSON Mirza, PA as PCP - General (Family Medicine)  Vitals   Vitals:   12/26/23 0915  BP: 110/60  Patient Position: Sitting  Pulse: 73  Temp: 97.2 F (36.2 C)  TempSrc: Skin  Resp: 18  Height: 5' 2 (1.575 m)  Weight: 168 lb 12.8 oz (76.6 kg)  SpO2: 97% Comment: on  3L of oxygen  BMI (Calculated): 30.9  PainSc: 0-No pain    Disposition   1. Encounter for subsequent annual wellness visit (AWV) in Medicare patient (Primary) 2. DM type 2 with diabetic dyslipidemia (*) -     Albumin /Creatinine Ratio, Random Urine; Future 3. Idiopathic pulmonary fibrosis (*) 4. Essential hypertension 5. Atrial fibrillation, unspecified type (*) 6. OSA (obstructive sleep apnea)   Follow up in about 3 months (around 03/27/2024).   Health maintenance issues were discussed with the patient.  A written plan was provided to the patient in the form of patient instructions in the After Visit Summary document.     *Some images could not be shown.

## 2023-12-28 NOTE — Anesthesia Preprocedure Evaluation (Addendum)
 Anesthesia Evaluation  Patient identified by MRN, date of birth, ID band Patient awake    Reviewed: Allergy & Precautions, NPO status , Patient's Chart, lab work & pertinent test results, reviewed documented beta blocker date and time   Airway Mallampati: II  TM Distance: >3 FB Neck ROM: Full    Dental  (+) Chipped,    Pulmonary shortness of breath and Long-Term Oxygen Therapy, sleep apnea, Continuous Positive Airway Pressure Ventilation and Oxygen sleep apnea , former smoker 43 pack year history  Interstitial lung disease 3L Oxygen [employer]   Pulmonary exam normal        Cardiovascular hypertension, Pt. on home beta blockers and Pt. on medications + CAD (s/p PCI 2010), + Cardiac Stents (x 3) and +CHF (mild RV reduction, normal LVEF)  Normal cardiovascular exam+ dysrhythmias Atrial Fibrillation   Echo 08/2023  1. Patient was tachycardic in the 160s throughout the procedure.   2. Left ventricular ejection fraction, by estimation, is 55 to 60%. The  left ventricle has normal function. The left ventricle has no regional  wall motion abnormalities.   3. Right ventricular systolic function is mildly reduced. The right  ventricular size is normal.   4. No left atrial/left atrial appendage thrombus was detected. The LAA  emptying velocity was 102 cm/s.   5. The mitral valve is normal in structure. Trivial mitral valve  regurgitation. No evidence of mitral stenosis.   6. The aortic valve is tricuspid. Aortic valve regurgitation is not  visualized. No aortic stenosis is present.   7. The inferior vena cava is normal in size with greater than 50%  respiratory variability, suggesting right atrial pressure of 3 mmHg.     Neuro/Psych  PSYCHIATRIC DISORDERS  Depression    negative neurological ROS     GI/Hepatic Neg liver ROS,GERD  Controlled,,  Endo/Other  diabetes  Patient on GLP-1 Agonist. Last dose on 08/03.  Renal/GU CRFRenal disease  (cr 1.48)     Musculoskeletal   Abdominal   Peds  Hematology  (+) Blood dyscrasia (Eliquis )   Anesthesia Other Findings   Reproductive/Obstetrics                              Anesthesia Physical Anesthesia Plan  ASA: 4  Anesthesia Plan: General   Post-op Pain Management:    Induction: Intravenous  PONV Risk Score and Plan: 2 and Ondansetron , Dexamethasone  and Treatment may vary due to age or medical condition  Airway Management Planned: Oral ETT  Additional Equipment:   Intra-op Plan:   Post-operative Plan: Extubation in OR  Informed Consent: I have reviewed the patients History and Physical, chart, labs and discussed the procedure including the risks, benefits and alternatives for the proposed anesthesia with the patient or authorized representative who has indicated his/her understanding and acceptance.     Dental advisory given  Plan Discussed with: CRNA  Anesthesia Plan Comments:          Anesthesia Quick Evaluation

## 2023-12-29 ENCOUNTER — Ambulatory Visit (HOSPITAL_COMMUNITY)
Admission: RE | Admit: 2023-12-29 | Discharge: 2023-12-29 | Disposition: A | Discharge status: Home / Self Care | Attending: Cardiology | Admitting: Cardiology

## 2023-12-29 ENCOUNTER — Other Ambulatory Visit: Payer: Self-pay

## 2023-12-29 ENCOUNTER — Ambulatory Visit (HOSPITAL_BASED_OUTPATIENT_CLINIC_OR_DEPARTMENT_OTHER): Admitting: Anesthesiology

## 2023-12-29 ENCOUNTER — Encounter (HOSPITAL_COMMUNITY)
Admission: RE | Disposition: A | Discharge status: Home / Self Care | Payer: Self-pay | Source: Home / Self Care | Attending: Cardiology

## 2023-12-29 ENCOUNTER — Ambulatory Visit (HOSPITAL_COMMUNITY): Admitting: Anesthesiology

## 2023-12-29 DIAGNOSIS — N1832 Chronic kidney disease, stage 3b: Secondary | ICD-10-CM | POA: Diagnosis not present

## 2023-12-29 DIAGNOSIS — Z9981 Dependence on supplemental oxygen: Secondary | ICD-10-CM | POA: Diagnosis not present

## 2023-12-29 DIAGNOSIS — Z7901 Long term (current) use of anticoagulants: Secondary | ICD-10-CM | POA: Insufficient documentation

## 2023-12-29 DIAGNOSIS — I110 Hypertensive heart disease with heart failure: Secondary | ICD-10-CM

## 2023-12-29 DIAGNOSIS — K219 Gastro-esophageal reflux disease without esophagitis: Secondary | ICD-10-CM | POA: Diagnosis not present

## 2023-12-29 DIAGNOSIS — D6869 Other thrombophilia: Secondary | ICD-10-CM | POA: Diagnosis not present

## 2023-12-29 DIAGNOSIS — E1122 Type 2 diabetes mellitus with diabetic chronic kidney disease: Secondary | ICD-10-CM | POA: Insufficient documentation

## 2023-12-29 DIAGNOSIS — J849 Interstitial pulmonary disease, unspecified: Secondary | ICD-10-CM | POA: Insufficient documentation

## 2023-12-29 DIAGNOSIS — I129 Hypertensive chronic kidney disease with stage 1 through stage 4 chronic kidney disease, or unspecified chronic kidney disease: Secondary | ICD-10-CM | POA: Diagnosis not present

## 2023-12-29 DIAGNOSIS — Z87891 Personal history of nicotine dependence: Secondary | ICD-10-CM | POA: Insufficient documentation

## 2023-12-29 DIAGNOSIS — G4733 Obstructive sleep apnea (adult) (pediatric): Secondary | ICD-10-CM | POA: Diagnosis not present

## 2023-12-29 DIAGNOSIS — Z955 Presence of coronary angioplasty implant and graft: Secondary | ICD-10-CM | POA: Diagnosis not present

## 2023-12-29 DIAGNOSIS — I480 Paroxysmal atrial fibrillation: Secondary | ICD-10-CM | POA: Insufficient documentation

## 2023-12-29 DIAGNOSIS — I483 Typical atrial flutter: Secondary | ICD-10-CM

## 2023-12-29 DIAGNOSIS — I2510 Atherosclerotic heart disease of native coronary artery without angina pectoris: Secondary | ICD-10-CM | POA: Insufficient documentation

## 2023-12-29 DIAGNOSIS — Z79899 Other long term (current) drug therapy: Secondary | ICD-10-CM | POA: Insufficient documentation

## 2023-12-29 HISTORY — PX: A-FLUTTER ABLATION: EP1230

## 2023-12-29 LAB — GLUCOSE, CAPILLARY
Glucose-Capillary: 98 mg/dL (ref 70–99)
Glucose-Capillary: 87 mg/dL (ref 70–99)

## 2023-12-29 MED ORDER — LIDOCAINE 2% (20 MG/ML) 5 ML SYRINGE
INTRAMUSCULAR | Status: DC | PRN
  Administered 2023-12-29: 60 mg via INTRAVENOUS

## 2023-12-29 MED ORDER — DEXAMETHASONE SODIUM PHOSPHATE 10 MG/ML IJ SOLN
INTRAMUSCULAR | Status: DC | PRN
  Administered 2023-12-29: 10 mg via INTRAVENOUS

## 2023-12-29 MED ORDER — SODIUM CHLORIDE 0.9 % IV SOLN: 250.0000 mL | INTRAVENOUS | Status: DC | PRN

## 2023-12-29 MED ORDER — MIDAZOLAM HCL 2 MG/2ML IJ SOLN
INTRAMUSCULAR | Status: AC
  Filled 2023-12-29: qty 2

## 2023-12-29 MED ORDER — HEPARIN SODIUM (PORCINE) 1000 UNIT/ML IJ SOLN
INTRAMUSCULAR | Status: DC | PRN
  Administered 2023-12-29: 1000 [IU] via INTRAVENOUS

## 2023-12-29 MED ORDER — ONDANSETRON HCL 4 MG/2ML IJ SOLN
INTRAMUSCULAR | Status: DC | PRN
  Administered 2023-12-29: 4 mg via INTRAVENOUS

## 2023-12-29 MED ORDER — APIXABAN 5 MG PO TABS
5.0000 mg | ORAL_TABLET | Freq: Once | ORAL | Status: AC
  Administered 2023-12-29: 5 mg via ORAL
  Filled 2023-12-29: qty 1

## 2023-12-29 MED ORDER — FENTANYL CITRATE (PF) 100 MCG/2ML IJ SOLN
INTRAMUSCULAR | Status: AC
Start: 2023-12-29 — End: 2023-12-29
  Filled 2023-12-29: qty 2

## 2023-12-29 MED ORDER — PHENYLEPHRINE HCL-NACL 20-0.9 MG/250ML-% IV SOLN
INTRAVENOUS | Status: DC | PRN
  Administered 2023-12-29: 40 ug/min via INTRAVENOUS

## 2023-12-29 MED ORDER — HEPARIN (PORCINE) IN NACL 1000-0.9 UT/500ML-% IV SOLN
INTRAVENOUS | Status: DC | PRN
  Administered 2023-12-29 (×2): 500 mL

## 2023-12-29 MED ORDER — HEPARIN SODIUM (PORCINE) 1000 UNIT/ML IJ SOLN
INTRAMUSCULAR | Status: AC
  Filled 2023-12-29: qty 10

## 2023-12-29 MED ORDER — SUGAMMADEX SODIUM 200 MG/2ML IV SOLN
INTRAVENOUS | Status: DC | PRN
Start: 2023-12-29 — End: 2023-12-29
  Administered 2023-12-29: 200 mg via INTRAVENOUS

## 2023-12-29 MED ORDER — SODIUM CHLORIDE 0.9% FLUSH: 3.0000 mL | Freq: Two times a day (BID) | INTRAVENOUS | Status: DC

## 2023-12-29 MED ORDER — FENTANYL CITRATE (PF) 100 MCG/2ML IJ SOLN
INTRAMUSCULAR | Status: DC | PRN
  Administered 2023-12-29: 100 ug via INTRAVENOUS

## 2023-12-29 MED ORDER — SODIUM CHLORIDE 0.9 % IV SOLN
INTRAVENOUS | Status: DC
Start: 2023-12-29 — End: 2023-12-30

## 2023-12-29 MED ORDER — FENTANYL CITRATE (PF) 100 MCG/2ML IJ SOLN
INTRAMUSCULAR | Status: AC
  Filled 2023-12-29: qty 2

## 2023-12-29 MED ORDER — PHENYLEPHRINE 80 MCG/ML (10ML) SYRINGE FOR IV PUSH (FOR BLOOD PRESSURE SUPPORT)
PREFILLED_SYRINGE | INTRAVENOUS | Status: DC | PRN
  Administered 2023-12-29: 200 ug via INTRAVENOUS

## 2023-12-29 MED ORDER — ONDANSETRON HCL 4 MG/2ML IJ SOLN: 4.0000 mg | Freq: Four times a day (QID) | INTRAMUSCULAR | Status: DC | PRN

## 2023-12-29 MED ORDER — ACETAMINOPHEN 325 MG PO TABS
650.0000 mg | ORAL_TABLET | ORAL | Status: DC | PRN
Start: 2023-12-29 — End: 2023-12-30

## 2023-12-29 MED ORDER — SODIUM CHLORIDE 0.9% FLUSH: 3.0000 mL | INTRAVENOUS | Status: DC | PRN

## 2023-12-29 MED ORDER — ROCURONIUM BROMIDE 100 MG/10ML IV SOLN
INTRAVENOUS | Status: DC | PRN
  Administered 2023-12-29: 10 mg via INTRAVENOUS
  Administered 2023-12-29: 5 mg via INTRAVENOUS
  Administered 2023-12-29: 50 mg via INTRAVENOUS
  Administered 2023-12-29: 5 mg via INTRAVENOUS

## 2023-12-29 MED ORDER — PROPOFOL 10 MG/ML IV BOLUS
INTRAVENOUS | Status: DC | PRN
  Administered 2023-12-29: 100 mg via INTRAVENOUS

## 2023-12-29 NOTE — Transfer of Care (Signed)
 Immediate Anesthesia Transfer of Care Note  Patient: Gary [facility]  Procedure(s) Performed: A-FLUTTER ABLATION  Patient Location: PACU  Anesthesia Type:General  Level of Consciousness: drowsy  Airway & Oxygen Therapy: Patient Spontanous Breathing and Patient connected to face mask oxygen  Post-op Assessment: Report given to RN and Post -op Vital signs reviewed and stable  Post vital signs: Reviewed and stable  Last Vitals:  Vitals Value Taken Time  BP 109/69 12/29/23 17:09  Temp    Pulse 81 12/29/23 17:12  Resp 15 12/29/23 17:12  SpO2 97 % 12/29/23 17:12  Vitals shown include unfiled device data.  Last Pain:  Vitals:   12/29/23 1105  TempSrc: Oral         Complications: No notable events documented.

## 2023-12-29 NOTE — Discharge Instructions (Signed)

## 2023-12-29 NOTE — H&P (Signed)
 Electrophysiology Note:   Date:  12/29/23  ID:  Dillin Lofgren, [place] [birth year], MRN [id number]   Primary Cardiologist: Redell Shallow, MD Electrophysiologist: None       History of Present Illness:   Gary Keller is a 73 y.o. male with h/o paroxysmal atrial fibrillation, HTN, coronary artery disease status post PCI in 2010, type 2 diabetes, HTN, interstitial lung disease, HLD, OSA, CKD stage III who is being seen today for evaluation of his atrial fibrillaition.   Discussed the use of AI scribe software for clinical note transcription with the patient, who gave verbal consent to proceed.   History of Present Illness He presented to the emergency department on 09/11/2023 with palpitations that had been present for the prior 48 hours. He noted some chest pressure. In the emergency department he was noted to have atrial flutter with RVR. He was started on Cardizem  gtt. His creatinine was elevated at 1.6. His cardiac troponins were noted to be 127 and then 108. Cardiology was consulted. He underwent DCCV on 09/12/2023. After conversion he became hypotensive. He required IV fluids. He required albumin  and phenylephrine . Had recurrence of AFL on 5/28 and presented to ED. He was given IV amiodarone  and converted to sinus. He was discharged on dronedarone  due to concerns of amiodarone  use in setting of his lung disease. During this most recent episode, he felt neck pain and discomfort across his shoulders, and his heart rate was elevated at 163 bpm. He describes feeling 'a little tired' during these episodes but has no significant shortness of breath, dizziness, or lightheadedness. He has a history of coronary artery disease with three stents placed.  He is on three liters of oxygen continuously and uses CPAP at night. His oxygen requirements have been stable, although he increases the flow when engaging in outdoor activities.   Interval: Patient presents today for scheduled ablation. Reports  feeling relatively well. No new or acute complaints.   Review of systems complete and found to be negative unless listed in HPI.    EP Information / Studies Reviewed:     EKG is not ordered today. EKG from 10/11/23 reviewed which showed typical AFL.     TEE 09/12/23:  1. Patient was tachycardic in the 160s throughout the procedure.   2. Left ventricular ejection fraction, by estimation, is 55 to 60%. The  left ventricle has normal function. The left ventricle has no regional  wall motion abnormalities.   3. Right ventricular systolic function is mildly reduced. The right  ventricular size is normal.   4. No left atrial/left atrial appendage thrombus was detected. The LAA  emptying velocity was 102 cm/s.   5. The mitral valve is normal in structure. Trivial mitral valve  regurgitation. No evidence of mitral stenosis.   6. The aortic valve is tricuspid. Aortic valve regurgitation is not  visualized. No aortic stenosis is present.   7. The inferior vena cava is normal in size with greater than 50%  respiratory variability, suggesting right atrial pressure of 3 mmHg.    Echo 09/11/23:  1. Left ventricular ejection fraction, by estimation, is 60 to 65%. The  left ventricle has normal function. The left ventricle has no regional  wall motion abnormalities. Left ventricular diastolic function could not  be evaluated due to atrial flutter.   2. Right ventricular systolic function is mildly reduced. The right  ventricular size is normal. Visually right ventricular wall thickness is  above normal limits. There is normal  pulmonary artery systolic pressure.  The estimated right ventricular  systolic pressure is 32.4 mmHg.   3. The mitral valve is normal in structure. Mild to moderate mitral valve  regurgitation. No evidence of mitral stenosis.   4. The aortic valve is tricuspid. Aortic valve regurgitation is not  visualized. Aortic valve sclerosis is present, with no evidence of aortic  valve  stenosis.    Risk Assessment/Calculations:     CHA2DS2-VASc Score = 3   This indicates a 3.2% annual risk of stroke. The patient's score is based upon: CHF History: 0 HTN History: 1 Diabetes History: 0 Stroke History: 0 Vascular Disease History: 1 Age Score: 1 Gender Score: 0               Physical Exam:    Today's Vitals   12/29/23 1105  BP: 123/61  Pulse: 68  Resp: 18  Temp: 97.6 F (36.4 C)  TempSrc: Oral  SpO2: 98%  Weight: 81.6 kg  Height: 5' 2 (1.575 m)   Body mass index is 32.92 kg/m.  GEN: Well nourished, well developed in no acute distress NECK: No JVD CARDIAC: Normal rate, regular rhythm RESPIRATORY:  Clear to auscultation without rales, wheezing or rhonchi  ABDOMEN: Soft, non-distended EXTREMITIES:  No edema; No deformity    ASSESSMENT AND PLAN:     #. Typical atrial flutter, symptomatic:  -Discussed treatment options today for AFL including antiarrhythmic drug therapy and ablation. Discussed risks, recovery and likelihood of success with each treatment strategy. Risk, benefits, and alternatives to EP study and ablation for afib were discussed. These risks include but are not limited to stroke, bleeding, vascular damage, tamponade, perforation, damage to the esophagus, lungs, phrenic nerve and other structures, pulmonary vein stenosis, worsening renal function, coronary vasospasm and death.  Discussed potential need for repeat ablation procedures and antiarrhythmic drugs after an initial ablation. The patient understands these risk and wishes to proceed today. -May need to do procedure under MAC sedation to avoid intubation in setting of lung disease, will defer to anesthesia team.  -Continue dronedarone  400mg  BID as bridge to ablation.  -Continue metoprolol  XL 50mg  daily.    #. 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  - Continue Eliquis  5mg  BID.     Follow up with Dr. Kennyth 3 months after ablation.     Signed, Fonda Kennyth, MD

## 2023-12-29 NOTE — Anesthesia Procedure Notes (Addendum)
 Procedure Name: Intubation Date/Time: 12/29/2023 3:22 PM  Performed by: Roslynn Waddell LABOR, CRNAPre-anesthesia Checklist: Patient identified, Emergency Drugs available, Suction available and Patient being monitored Patient Re-evaluated:Patient Re-evaluated prior to induction Oxygen Delivery Method: Circle System Utilized Preoxygenation: Pre-oxygenation with 100% oxygen Induction Type: IV induction Ventilation: Oral airway inserted - appropriate to patient size Laryngoscope Size: Glidescope and 3 Grade View: Grade I Tube type: Oral Tube size: 7.5 mm Number of attempts: 1 Airway Equipment and Method: Stylet and Oral airway Placement Confirmation: ETT inserted through vocal cords under direct vision, positive ETCO2 and breath sounds checked- equal and bilateral Secured at: 23 cm Tube secured with: Tape Dental Injury: Teeth and Oropharynx as per pre-operative assessment  Comments: Atraumatic induction/intubation. Dentition and oral mucosa as per preop.

## 2023-12-29 NOTE — Anesthesia Postprocedure Evaluation (Signed)
 Anesthesia Post Note  Patient: Iokepa Geffre Sr Switzer  Procedure(s) Performed: A-FLUTTER ABLATION     Patient location during evaluation: PACU Anesthesia Type: General Level of consciousness: awake and alert, oriented and patient cooperative Pain management: pain level controlled Vital Signs Assessment: post-procedure vital signs reviewed and stable Respiratory status: spontaneous breathing, nonlabored ventilation and respiratory function stable Cardiovascular status: blood pressure returned to baseline and stable Postop Assessment: no apparent nausea or vomiting Anesthetic complications: no   No notable events documented.  Last Vitals:  Vitals:   12/29/23 1741 12/29/23 1800  BP:  108/72  Pulse: 79 82  Resp: 12 13  Temp:    SpO2: 94% 92%    Last Pain:  Vitals:   12/29/23 1805  TempSrc:   PainSc: 0-No pain                 Almarie CHRISTELLA Marchi

## 2023-12-30 ENCOUNTER — Encounter (HOSPITAL_COMMUNITY): Payer: Self-pay | Admitting: Cardiology

## 2024-01-01 ENCOUNTER — Telehealth (HOSPITAL_COMMUNITY): Payer: Self-pay

## 2024-01-01 NOTE — Progress Notes (Unsigned)
 Cardiology Clinic Note   Patient Name: Gary Keller Date of Encounter: 01/03/2024  Primary Care Provider:  Medicine, [facility] (Inactive) Primary Cardiologist:  Redell Shallow, MD  Patient Profile    Gary Keller 73 year old male presents to the clinic today for follow-up evaluation of his paroxysmal atrial fibrillation.   Past Medical History    Past Medical History:  Diagnosis Date   CAD (coronary artery disease)    Depression    Diabetes mellitus (HCC)    Borderline   Encounter for screening for malignant neoplasm of prostate    HTN (hypertension)    Hyperlipidemia    Non morbid obesity due to excess calories    Paroxysmal atrial fibrillation (HCC)    Renal insufficiency    Rhinitis, allergic    Past Surgical History:  Procedure Laterality Date   A-FLUTTER ABLATION N/A 12/29/2023   Procedure: A-FLUTTER ABLATION;  Surgeon: Kennyth Chew, MD;  Location: [facility] INVASIVE CV LAB;  Service: Cardiovascular;  Laterality: N/A;   APPENDECTOMY     CARDIOVERSION N/A 09/12/2023   Procedure: CARDIOVERSION;  Surgeon: Raford Riggs, MD;  Location: [facility] INVASIVE CV LAB;  Service: Cardiovascular;  Laterality: N/A;   COLONOSCOPY WITH ESOPHAGOGASTRODUODENOSCOPY (EGD)     LEFT HEART CATH  2006   Stent x3   TRANSESOPHAGEAL ECHOCARDIOGRAM (CATH LAB) N/A 09/12/2023   Procedure: TRANSESOPHAGEAL ECHOCARDIOGRAM;  Surgeon: Raford Riggs, MD;  Location: [facility] INVASIVE CV LAB;  Service: Cardiovascular;  Laterality: N/A;    Allergies  No Known Allergies  History of Present Illness    [name] has a PMH of atrial flutter with RVR, HTN, coronary artery disease status post PCI in 2010, type 2 diabetes, HTN, interstitial lung disease, HLD, OSA, CKD stage III, and atrial fibrillation with RVR.  He presented to the emergency department on 09/11/2023 and was discharged on 09/13/2023.  He presented with palpitations that had been present for the prior 48  hours.  He noted some chest pressure.  In the emergency department he was noted to have atrial flutter with RVR.  He was started on Cardizem  gtt.  His creatinine was elevated at 1.6.  His cardiac troponins were noted to be 127 and then 108.  Cardiology was consulted.  He underwent DCCV on 09/12/2023.  After conversion he became hypotensive.  He required IV fluids.  He required albumin  and Neo-Synephrine.  He was transitioned to ICU.  He improved rapidly.  His CHA2DS2-VASc score was noted to be 4.  He was continued on apixaban .  He was also continued on aspirin .  His Plavix and losartan  were held until follow-up with outpatient cardiology.  His echocardiogram showed LVEF of 55 to 60%.  He presented to the clinic 10/03/23 for follow-up evaluation and stated he was doing well.  He had not had any further episodes of fast or irregular heartbeat since his cardioversion.  We reviewed his hospitalization.  He expressed understanding.  His EKG showed sinus rhythm.  He reported compliance with his apixaban .  He denied bleeding issues.  We reviewed his echocardiogram.  He continued to use 3 L of oxygen via nasal cannula.    He was seen and evaluated in the emergency department on 10/11/2023.  He was noted to have recurrence of AFL.  He received IV amiodarone  and converted to sinus rhythm.  He was discharged on dronedarone  due to concerns for amiodarone  use in the setting of his lung disease.  During his episode he reported  neck pain and discomfort across his shoulders.  He was noted to have a elevated heart rate at 163 bpm.  He described feeling tired.  He also stated he did not have significant episodes of shortness of breath, dizziness or lightheadedness.  He was continued on 3 L of oxygen and was using CPAP at night.  He presented for ablation with Dr. Kennyth on 12/29/2023.  He reported that he felt well.  He had no new complaints.  He was continued on dronedarone  400 mg twice daily to bridge to ablation.  He was  continued on metoprolol  50 mg daily.  His CHA2DS2-VASc score was noted to be 3.  He was continued on Eliquis  5 mg twice daily.  Follow-up was planned for 3 months post ablation.  He presents to the clinic today for follow-up evaluation and states he is ready to start being more physically active.  He presents with his daughter.  We reviewed his recent ablation procedure.  We reviewed his previous general cardiology visit.  He continues to use 3 L of oxygen.  His EKG today shows sinus rhythm.  His blood pressure is well-controlled.  His bilateral groin sites show no signs of infection.  No redness or erythema are noted.  On exam he is noted to have a right carotid bruit.  I will order carotid duplex for further evaluation and plan follow-up in 4 to 6 months.  Today he denies chest pain, increased shortness of breath, lower extremity edema, fatigue, palpitations, melena, hematuria, hemoptysis, diaphoresis, weakness, presyncope, syncope, orthopnea, and PND.   Home Medications    Prior to Admission medications   Medication Sig Start Date End Date Taking? Authorizing Provider  amLODipine  (NORVASC ) 5 MG tablet TAKE 1 TABLET(5 MG) BY MOUTH DAILY 02/22/21   Pietro Redell RAMAN, MD  apixaban  (ELIQUIS ) 5 MG TABS tablet Take 1 tablet (5 mg total) by mouth 2 (two) times daily. 09/13/23   Pokhrel, Laxman, MD  aspirin  EC 81 MG tablet Take 1 tablet (81 mg total) by mouth daily. Swallow whole. 09/13/23 09/12/24  Pokhrel, Laxman, MD  atorvastatin  (LIPITOR) 80 MG tablet TAKE 1 TABLET(80 MG) BY MOUTH DAILY 02/03/23   Pietro Redell RAMAN, MD  fenofibrate  160 MG tablet Take 160 mg by mouth daily.  01/11/13   [provider]  metoprolol  succinate (TOPROL -XL) 50 MG 24 hr tablet Take 50 mg by mouth daily.    [provider]  MOUNJARO 10 MG/0.5ML Pen Inject 10 mg into the skin once a week.    [provider]  omeprazole (PRILOSEC) 40 MG capsule Take 40 mg by mouth daily. 04/29/23   [provider]  Pirfenidone  (ESBRIET ) 267 MG CAPS Take 801 mg by mouth in the morning, at noon, and at bedtime. 08/28/23   Jude Harden GAILS, MD  predniSONE  (DELTASONE ) 5 MG tablet Take Monday, Wednesday and friday Patient taking differently: Takes TTSS - skip MWF 02/06/23   Jude Harden GAILS, MD  VASCEPA  1 g capsule Take 2 capsules (2 g total) by mouth 2 (two) times daily. 08/16/23   Vannie Reche RAMAN, NP  venlafaxine  XR (EFFEXOR -XR) 75 MG 24 hr capsule Take 75 mg by mouth daily with breakfast.  09/03/09   [provider]    Family History    Family History  Problem Relation Age of Onset   Diabetes Brother    Heart disease Brother    Heart disease Father    Hypertension Brother    Stroke Mother  Stroke Brother    Lung disease Neg Hx    Rheumatologic disease Neg Hx    He indicated that the status of his mother is unknown. He indicated that the status of his father is unknown. He indicated that the status of his neg hx is unknown.  Social History    Social History   Socioeconomic History   Marital status: Married    Spouse name: Not on file   Number of children: 3   Years of education: Not on file   Highest education level: Not on file  Occupational History    Comment: Retired  Tobacco Use   Smoking status: Former    Current packs/day: 0.00    Average packs/day: 1 pack/day for 43.0 years (43.0 ttl pk-yrs)    Types: Cigarettes    Start date: 77    Quit date: 05/16/2001    Years since quitting: 22.6   Smokeless tobacco: Never  Vaping Use   Vaping status: Never Used  Substance and Sexual Activity   Alcohol use: Yes    Alcohol/week: 0.0 standard drinks of alcohol    Comment: Occasional   Drug use: No   Sexual activity: Not on file  Other Topics Concern   Not on file  Social History Narrative   [facility] (05/27/16):   Originally from [place]. Has always lived in [place]. 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 Does have exposure to chemicals from  spraying. No mold exposure. Remote cockatiel exposure in a previous home. No hot tub exposure. Enjoys watching his grandchildren and hunting.    Social Drivers of Corporate investment banker Strain: Low Risk  (12/23/2023)   Received from [facility]   Overall Financial Resource Strain (CARDIA)    How hard is it for you to pay for the very basics like food, housing, medical care, and heating?: Not hard at all  Food Insecurity: No Food Insecurity (12/23/2023)   Received from [facility]   Hunger Vital Sign    Within the past 12 months, you worried that your food would run out before you got the money to buy more.: Never true    Within the past 12 months, the food you bought just didn't last and you didn't have money to get more.: Never true  Transportation Needs: No Transportation Needs (12/23/2023)   Received from [facility] - Transportation    In the past 12 months, has lack of transportation kept you from medical appointments or from getting medications?: No    In the past 12 months, has lack of transportation kept you from meetings, work, or from getting things needed for daily living?: No  Physical Activity: Inactive (12/23/2023)   [facility]   Exercise Vital Sign    On average, how many days per week do you engage in moderate to strenuous exercise (like a brisk walk)?: 0 days    Minutes of Exercise per Session: Not on file  Stress: No Stress Concern Present (12/23/2023)   Received from [facility] - Occupational Stress Questionnaire    Do you feel stress - tense, restless, nervous, or anxious, or unable to sleep at night because your mind is troubled all the time - these days?: Not at all  Social Connections: Socially Integrated (12/23/2023)   Received from [facility]   Social Network    How would you rate your social network (family, work,  friends)?: Good participation with social networks  Intimate Partner  Violence: Not At Risk (12/23/2023)   Received from [facility]   HITS    Over the last 12 months how often did your partner physically hurt you?: Never    Over the last 12 months how often did your partner insult you or talk down to you?: Never    Over the last 12 months how often did your partner threaten you with physical harm?: Never    Over the last 12 months how often did your partner scream or curse at you?: Never     Review of Systems    General:  No chills, fever, night sweats or weight changes.  Cardiovascular:  No chest pain, dyspnea on exertion, edema, orthopnea, palpitations, paroxysmal nocturnal dyspnea. Dermatological: No rash, lesions/masses Respiratory: No cough, dyspnea Urologic: No hematuria, dysuria Abdominal:   No nausea, vomiting, diarrhea, bright red blood per rectum, melena, or hematemesis Neurologic:  No visual changes, wkns, changes in mental status. All other systems reviewed and are otherwise negative except as noted above.  Physical Exam    VS:  BP 130/78 (BP Location: Left Arm, Patient Position: Sitting, Cuff Size: Normal)   Pulse 88   Ht 5' 2 (1.575 m)   Wt 168 lb 3.2 oz (76.3 kg)   SpO2 95%   BMI 30.76 kg/m  , BMI Body mass index is 30.76 kg/m. GEN: Well nourished, well developed, in no acute distress. HEENT: Right carotid bruit noted Neck: Supple, no JVD, carotid bruits, or masses. Cardiac: RRR, no murmurs, rubs, or gallops. No clubbing, cyanosis, edema.  Radials/DP/PT 2+ and equal bilaterally.  Respiratory:  Respirations regular and unlabored, clear to auscultation bilaterally.  On 3 L nasal cannula GI: Soft, nontender, nondistended, BS + x 4. MS: no deformity or atrophy. Skin: warm and dry, no rash. Neuro:  Strength and sensation are intact. Psych: Normal affect.  Accessory Clinical Findings    Recent Labs: 10/12/2023: Magnesium  2.0; TSH 3.133 11/27/2023: ALT 16 12/15/2023: BUN 30; Creatinine, Ser 1.48; Hemoglobin 14.0; Platelets 228;  Potassium 4.1; Sodium 140   Recent Lipid Panel    Component Value Date/Time   CHOL 147 03/18/2021 0921   TRIG 664 (HH) 03/18/2021 0921   HDL 19 (L) 03/18/2021 0921   CHOLHDL 7.7 (H) 03/18/2021 0921   CHOLHDL 5 12/02/2009 0000   VLDL 58.2 (H) 12/02/2009 0000   LDLCALC 36 03/18/2021 0921   LDLDIRECT 45.5 12/02/2009 0000         ECG personally reviewed by me today- EKG Interpretation Date/Time:  Wednesday January 03 2024 16:01:40 EDT Ventricular Rate:  80 PR Interval:  124 QRS Duration:  82 QT Interval:  384 QTC Calculation: 442 R Axis:   -37  Text Interpretation: Normal sinus rhythm Left axis deviation When compared with ECG of 29-Dec-2023 17:19, Premature ventricular complexes are no longer Present Confirmed by Emelia Hazy [phone]) on 01/03/2024 4:03:21 PM    TEE 09/12/2023  IMPRESSIONS     1. Patient was tachycardic in the 160s throughout the procedure.   2. Left ventricular ejection fraction, by estimation, is 55 to 60%. The  left ventricle has normal function. The left ventricle has no regional  wall motion abnormalities.   3. Right ventricular systolic function is mildly reduced. The right  ventricular size is normal.   4. No left atrial/left atrial appendage thrombus was detected. The LAA  emptying velocity was 102 cm/s.   5. The mitral valve is normal in structure. Trivial  mitral valve  regurgitation. No evidence of mitral stenosis.   6. The aortic valve is tricuspid. Aortic valve regurgitation is not  visualized. No aortic stenosis is present.   7. The inferior vena cava is normal in size with greater than 50%  respiratory variability, suggesting right atrial pressure of 3 mmHg.   Conclusion(s)/Recommendation(s): No LA/LAA thrombus identified. Successful  cardioversion performed with restoration of normal sinus rhythm.   FINDINGS   Left Ventricle: Left ventricular ejection fraction, by estimation, is 55  to 60%. The left ventricle has normal function. The  left ventricle has no  regional wall motion abnormalities. The left ventricular internal cavity  size was normal in size. There is   no left ventricular hypertrophy.   Right Ventricle: The right ventricular size is normal. No increase in  right ventricular wall thickness. Right ventricular systolic function is  mildly reduced.   Left Atrium: Left atrial size was normal in size. No left atrial/left  atrial appendage thrombus was detected. The LAA emptying velocity was 102  cm/s.   Right Atrium: Right atrial size was normal in size.   Pericardium: There is no evidence of pericardial effusion.   Mitral Valve: The mitral valve is normal in structure. Trivial mitral  valve regurgitation. No evidence of mitral valve stenosis.   Tricuspid Valve: The tricuspid valve is normal in structure. Tricuspid  valve regurgitation is trivial. No evidence of tricuspid stenosis.   Aortic Valve: The aortic valve is tricuspid. Aortic valve regurgitation is  not visualized. No aortic stenosis is present.   Pulmonic Valve: The pulmonic valve was normal in structure. Pulmonic valve  regurgitation is not visualized. No evidence of pulmonic stenosis.   Aorta: The aortic root is normal in size and structure. There is minimal  (Grade I) plaque.   Venous: The inferior vena cava is normal in size with greater than 50%  respiratory variability, suggesting right atrial pressure of 3 mmHg.   IAS/Shunts: No atrial level shunt detected by color flow Doppler.      Assessment & Plan   1.  Atrial flutter with RVR-EKG today shows sinus rhythm 80 bpm.  Denies recurrent episodes of accelerated or irregular heartbeat.  Underwent DCCV on 09/12/2023 and developed hypotension.  He had recurrent AFL and underwent ablation by Dr. Kennyth on 12/29/2023.   Avoid triggers caffeine, chocolate, EtOH, dehydration etc. Continue apixaban , metoprolol , dronedarone   This patients CHA2DS2-VASc Score and unadjusted Ischemic Stroke  Rate (% per year) is equal to 3.2 % stroke rate/year from a score of 3  Carotid bruit-noted to have right carotid bruit on exam.  LDL cholesterol at goal. Order carotid duplex  Coronary artery disease-continues to deny chest pain.  Denies exertional chest discomfort, not physically active at this time post ablation. Continue aspirin , metoprolol  Heart healthy low-sodium diet-reviewed  Essential hypertension-BP today 130/78. Maintain blood pressure log Continue metoprolol  Heart healthy low-sodium diet  OSA-continues with compliance  of CPAP.   Continue CPAP use   Disposition: Follow-up with Dr. Pietro or me in 4-6 months.  Follow-up with Dr. Kennyth as scheduled.   Josefa HERO. Glenden Rossell NP-C     01/03/2024, 4:26 PM [facility] [address] [place], [place] [address] Office [phone]      I spent 14 minutes examining this patient, reviewing medications, and using patient centered shared decision making involving their cardiac care.   I spent  20 minutes reviewing past medical history,  medications, and prior cardiac tests.

## 2024-01-01 NOTE — Telephone Encounter (Signed)
 Attempted to reach patient to follow up with procedure completed on 12/29/23, no answer. Left VM for patient to return call.

## 2024-01-03 ENCOUNTER — Encounter: Payer: Self-pay | Admitting: General Practice

## 2024-01-03 ENCOUNTER — Ambulatory Visit: Attending: Cardiovascular Disease | Admitting: General Practice

## 2024-01-03 VITALS — BP 130/78 | HR 88 | Ht 62.0 in | Wt 168.2 lb

## 2024-01-03 DIAGNOSIS — I25118 Atherosclerotic heart disease of native coronary artery with other forms of angina pectoris: Secondary | ICD-10-CM

## 2024-01-03 DIAGNOSIS — G4733 Obstructive sleep apnea (adult) (pediatric): Secondary | ICD-10-CM

## 2024-01-03 DIAGNOSIS — R0989 Other specified symptoms and signs involving the circulatory and respiratory systems: Secondary | ICD-10-CM

## 2024-01-03 DIAGNOSIS — I4892 Unspecified atrial flutter: Secondary | ICD-10-CM | POA: Diagnosis not present

## 2024-01-03 DIAGNOSIS — I10 Essential (primary) hypertension: Secondary | ICD-10-CM

## 2024-01-03 NOTE — Patient Instructions (Addendum)
 Medication Instructions:  Your physician recommends that you continue on your current medications as directed. Please refer to the Current Medication list given to you today.  *If you need a refill on your cardiac medications before your next appointment, please call your pharmacy*  Lab Work: NONE If you have labs (blood work) drawn today and your tests are completely normal, you will receive your results only by: MyChart Message (if you have MyChart) OR A paper copy in the mail If you have any lab test that is abnormal or we need to change your treatment, we will call you to review the results.  Testing/Procedures: Your physician has requested that you have a carotid duplex. This test is an ultrasound of the carotid arteries in your neck. It looks at blood flow through these arteries that supply the brain with blood. Allow one hour for this exam. There are no restrictions or special instructions.   Follow-Up: At [facility], you and your health needs are our priority.  As part of our continuing mission to provide you with exceptional heart care, our providers are all part of one team.  This team includes your primary Cardiologist (physician) and Advanced Practice Providers or APPs (Physician Assistants and Nurse Practitioners) who all work together to provide you with the care you need, when you need it.  Your next appointment:   4-6 month(s)  Provider:   Redell Shallow, MD  We recommend signing up for the patient portal called MyChart.  Sign up information is provided on this After Visit Summary.  MyChart is used to connect with patients for Virtual Visits (Telemedicine).  Patients are able to view lab/test results, encounter notes, upcoming appointments, etc.  Non-urgent messages can be sent to your provider as well.   To learn more about what you can do with MyChart, go to [email or web address].   Other Instructions IN ABOUT 2 DAYS YOU CAN: Exercise regularly as told by  your doctor. Make sure to weight daily and keep a weight log.   Moderate-intensity exercise is any activity that gets you moving enough to burn at least three times more energy (calories) than if you were sitting. Examples of moderate exercise include: Walking a mile in 15 minutes. Doing light yard work. Biking at an easy pace. Most people should get at least 150 minutes of moderate-intensity exercise a week to maintain their body weight.  Increase your water intake: Maintain hydration

## 2024-01-05 ENCOUNTER — Telehealth (HOSPITAL_BASED_OUTPATIENT_CLINIC_OR_DEPARTMENT_OTHER): Payer: Self-pay

## 2024-01-05 NOTE — Telephone Encounter (Signed)
 CMN received for oxygen  signed by provider and faxed confirmation received

## 2024-01-11 ENCOUNTER — Ambulatory Visit (HOSPITAL_COMMUNITY)
Admission: RE | Admit: 2024-01-11 | Discharge: 2024-01-11 | Disposition: A | Discharge status: Home / Self Care | Source: Ambulatory Visit | Attending: General Practice | Admitting: General Practice

## 2024-01-11 DIAGNOSIS — R0989 Other specified symptoms and signs involving the circulatory and respiratory systems: Secondary | ICD-10-CM | POA: Insufficient documentation

## 2024-01-16 ENCOUNTER — Ambulatory Visit: Payer: Self-pay | Admitting: General Practice

## 2024-01-29 ENCOUNTER — Ambulatory Visit: Attending: Student | Admitting: Student

## 2024-01-29 ENCOUNTER — Encounter: Payer: Self-pay | Admitting: Student

## 2024-01-29 VITALS — BP 127/75 | HR 78 | Ht 62.0 in | Wt 165.8 lb

## 2024-01-29 DIAGNOSIS — I4892 Unspecified atrial flutter: Secondary | ICD-10-CM | POA: Diagnosis not present

## 2024-01-29 DIAGNOSIS — I4891 Unspecified atrial fibrillation: Secondary | ICD-10-CM | POA: Diagnosis not present

## 2024-01-29 NOTE — Progress Notes (Signed)
  Electrophysiology Office Note:   Date:  01/29/2024  ID:  Gary Keller, [place] [birth year], MRN [id number]  Primary Cardiologist: Redell Shallow, MD Electrophysiologist: Fonda Kitty, MD   Cardiology APP:  Emelia Josefa HERO, NP  Electrophysiologist:  Fonda Kitty, MD      History of Present Illness:   Gary Keller is a 73 y.o. male with h/o  paroxysmal atrial flutter, HTN, coronary artery disease status post PCI in 2010, type 2 diabetes, HTN, interstitial lung disease, HLD, OSA, CKD stage III  seen today for routine electrophysiology follow-up s/p Ablation.  Since last being seen in our clinic the patient reports doing well from a cardiac perspective. No breakthrough arrhythmia of which he is aware. No palpitations, SOB, or syncope. Taking all medications as directed.   Review of systems complete and found to be negative unless listed in HPI.   EP Information / Studies Reviewed:    EKG is ordered today. Personal review as below.  EKG Interpretation Date/Time:  Monday January 29 2024 11:52:10 EDT Ventricular Rate:  78 PR Interval:  124 QRS Duration:  86 QT Interval:  376 QTC Calculation: 428 R Axis:   -46  Text Interpretation: Normal sinus rhythm with sinus arrhythmia Pulmonary disease pattern Left anterior fascicular block When compared with ECG of 03-Jan-2024 16:01, No significant change was found Confirmed by Lesia Sharper [phone]) on 01/29/2024 12:04:48 PM    Arrhythmia/Device History No specialty comments available.   Physical Exam:   VS:  BP 127/75   Pulse 78   Ht 5' 2 (1.575 m)   Wt 165 lb 12.8 oz (75.2 kg)   SpO2 100%   BMI 30.33 kg/m    Wt Readings from Last 3 Encounters:  01/29/24 165 lb 12.8 oz (75.2 kg)  01/03/24 168 lb 3.2 oz (76.3 kg)  12/29/23 180 lb (81.6 kg)     GEN: No acute distress NECK: No JVD; No carotid bruits CARDIAC: Regular rate and rhythm, no murmurs, rubs, gallops RESPIRATORY:  Clear to auscultation without rales, wheezing or  rhonchi  ABDOMEN: Soft, non-tender, non-distended EXTREMITIES:  No edema; No deformity   ASSESSMENT AND PLAN:    Typical atrial flutter, symptomatic S/p ablation 12/29/2023 EKG today with NSR with stable intervals Stop multaq .  Continue eliquis  5 mg BID  CAD No s/s of ischemia.      HTN Stable on current regimen    Follow up with Dr. Kitty in 6 months; Sooner with recurrence.   Signed, Sharper Prentice Lesia, PA-C

## 2024-01-29 NOTE — Patient Instructions (Addendum)
 Medication Instructions:  Your physician has recommended you make the following change in your medication: STOP MULTAQ   medication.   *If you need a refill on your cardiac medications before your next appointment, please call your pharmacy*  Lab Work: None ordered If you have labs (blood work) drawn today and your tests are completely normal, you will receive your results only by: MyChart Message (if you have MyChart) OR A paper copy in the mail If you have any lab test that is abnormal or we need to change your treatment, we will call you to review the results.  Follow-Up: At [facility], you and your health needs are our priority.  As part of our continuing mission to provide you with exceptional heart care, our providers are all part of one team.  This team includes your primary Cardiologist (physician) and Advanced Practice Providers or APPs (Physician Assistants and Nurse Practitioners) who all work together to provide you with the care you need, when you need it.  Your next appointment:   6 month(s)  Provider:   Fonda Kitty, MD

## 2024-02-01 ENCOUNTER — Other Ambulatory Visit: Payer: Self-pay | Admitting: Cardiology

## 2024-02-04 ENCOUNTER — Encounter (HOSPITAL_BASED_OUTPATIENT_CLINIC_OR_DEPARTMENT_OTHER): Payer: Self-pay | Admitting: Pulmonary Disease

## 2024-02-05 ENCOUNTER — Other Ambulatory Visit (HOSPITAL_BASED_OUTPATIENT_CLINIC_OR_DEPARTMENT_OTHER): Payer: Self-pay

## 2024-02-05 MED ORDER — PREDNISONE 5 MG PO TABS: ORAL_TABLET | ORAL | 1 refills | Status: AC

## 2024-02-28 ENCOUNTER — Other Ambulatory Visit (HOSPITAL_BASED_OUTPATIENT_CLINIC_OR_DEPARTMENT_OTHER): Payer: Self-pay | Admitting: Pulmonary Disease

## 2024-03-07 ENCOUNTER — Encounter (HOSPITAL_BASED_OUTPATIENT_CLINIC_OR_DEPARTMENT_OTHER): Payer: Self-pay | Admitting: Pulmonary Disease

## 2024-03-07 ENCOUNTER — Ambulatory Visit (HOSPITAL_BASED_OUTPATIENT_CLINIC_OR_DEPARTMENT_OTHER): Admitting: Pulmonary Disease

## 2024-03-07 VITALS — BP 128/67 | HR 80 | Ht 62.0 in | Wt 168.0 lb

## 2024-03-07 DIAGNOSIS — G4733 Obstructive sleep apnea (adult) (pediatric): Secondary | ICD-10-CM

## 2024-03-07 DIAGNOSIS — J849 Interstitial pulmonary disease, unspecified: Secondary | ICD-10-CM | POA: Diagnosis not present

## 2024-03-07 DIAGNOSIS — Z23 Encounter for immunization: Secondary | ICD-10-CM

## 2024-03-07 NOTE — Addendum Note (Signed)
 Addended by: ALINDA WINN KIDD on: 03/07/2024 04:44 PM   Modules accepted: Orders

## 2024-03-07 NOTE — Addendum Note (Signed)
 Addended by: ALINDA WINN KIDD on: 03/07/2024 04:59 PM   Modules accepted: Orders

## 2024-03-07 NOTE — Patient Instructions (Addendum)
 X change to auto 10-14 cm  X Rx for CPAP supplies to adapt  X Check LFTs today  HRCT chest jan 2025  - Reduce prednisone  to Monday and Friday for November, then discontinue in December.

## 2024-03-07 NOTE — Progress Notes (Signed)
 Subjective:    Patient ID: Gary Keller, male    DOB: February 10, 1951, 73 y.o.   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  Due to clinical progression and worsening hypoxia , OV 07/2022 >>empiric trial of prednisone  20 mg for 1 week followed by 10 mg daily.     PMH : AF p-ablation 12/2023   Discussed the use of AI scribe software for clinical note transcription with the patient, who gave verbal consent to proceed.  History of Present Illness Gary Keller is a 73 year old male with interstitial lung disease and obstructive cardiomyopathy who presents for follow-up.  He has lost 55 pounds, which he attributes to medication use. His oxygen levels remain stable at 98% with supplemental oxygen, although he often forgets to use it while working outside. He is on pirfenidone  for interstitial lung disease and has discontinued Ofev  due to gastrointestinal side effects. He takes prednisone  four days a week, skipping Monday and Wednesday, and notes improvement in his condition, partly due to weight loss.  He has not received CPAP supplies since August despite a promise of delivery. He uses his CPAP machine regularly, with zero events reported.     Significant tests/ events reviewed   Serology neg 06/2016 PFT   04/2022 >> FVC 54%, DLCO 9.4/47% 02/2021 >>  FVC 63%, DLCO 12.8/64%   02/2020 >> stable, FVC 64% DLCO 78%   12/2018 >> FVC 66%, TLC 72%, DLCO 64% 02/2018 FVC 66%    05/2017 ratio 88, FVC 71%, DLCO 76%   01/06/17: FVC 2.29 L (67%) FEV1 2.03 L (81%) FEV1/FVC 0.88   DLCO corrected 75%   06/24/16: FVC 2.49 L (76%) FEV1 2.21 L (91%) FEV1/FVC 0.89  negative bronchodilator response TLC 3.77 L (69%)  DLCO uncorrected 66%   01/06/17:  Walked 412 meters / Baseline Sat 99% on RA / Nadir Sat 90% on RA @ end of test     PSG(06/07/16):  AHI 15.0 events/hour., nadir satn 78%   >> Optimal CPAP pressure 14 cm      HRCT 04/2023 slightly progressive from 05/03/2022>> fibrotic HP   HRCT 04/2022 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 No appreciable interval progression since 06/29/2021 CT. Mild progression since baseline 05/31/2016      HRCT 06/2021 unchanged fibrosis alternative HRCT 06/2020 UIP, unchanged compared to 2021, air trapping raises question of chronic hypersensitivity pneumonitis   HRCT 02/2019  c/w UIP , air trapping raises concern for HP, minimally worse compared to 2018   HRCT CHEST W/O 05/2016 UIP pattern.  Subcarinal and precarinal lymph nodes measuring up to 1.1 & 1.2 cm in short axis. 1.1 CM oblong nodule within left lower lobe      CT CHEST W/ 04/2016 : 9 mm left lower lobe nodule. Diffuse interlobular septal thickening with architectural distortion inferiorly with honeycombing versus bronchiectasis and medial right lower lobe.     CT CHEST W/ 07/2007:. Irregular  interstitial prominence in addition. Ill-defined cystic lucencies diffusely. Fracture of left 10th rib noted.  Review of Systems  neg for any significant sore throat, dysphagia, itching, sneezing, nasal congestion or excess/ purulent secretions, fever, chills, sweats, unintended wt loss, pleuritic or exertional cp, hempoptysis, orthopnea pnd or change in chronic leg swelling. Also denies presyncope, palpitations, heartburn, abdominal pain, nausea, vomiting, diarrhea or change in bowel or urinary habits, dysuria,hematuria, rash, arthralgias, visual  complaints, headache, numbness weakness or ataxia.      Objective:   Physical Exam  Gen. Pleasant, well-nourished, in no distress ENT - no thrush, no pallor/icterus,no post nasal drip Neck: No JVD, no thyromegaly, no carotid bruits Lungs: no use of accessory muscles, no dullness to percussion, BB dry rales no rhonchi  Cardiovascular: Rhythm regular, heart sounds  normal, no murmurs or gallops, no peripheral edema Musculoskeletal: No deformities, no cyanosis or clubbing        Assessment & Plan:   Assessment and Plan Assessment & Plan Fibrotic interstitial lung disease The fibrotic interstitial lung disease is improving or stabilizing, which is atypical for more severe forms. The initial diagnosis may not have been entirely accurate, as the disease is behaving more like a fibrotic type that responds to treatment. His weight loss has contributed significantly to the improvement. - Continue pirfenidone .- Draw blood for liver function tests today. - Reduce prednisone  to Monday and Friday for November, then discontinue in December. -HRCT in dec  Obstructive sleep apnea He is experiencing issues with CPAP supplies, which have not been delivered since August. His CPAP usage is excellent with zero events, and he may require a lower pressure due to weight loss. - Send another prescription and reminder to the CPAP supply company. - Adjust CPAP pressure to auto mode, set between 10 to 14 cm H2O.  Type 2 diabetes mellitus Type 2 diabetes mellitus is well-controlled with an A1c of 5.6%.  Obesity He has lost approximately 55 pounds, likely due to the use of Mounjaro, which has contributed to the improvement in his interstitial lung disease and overall health.   - Administer flu shot today.

## 2024-03-08 LAB — HEPATIC FUNCTION PANEL
ALT: 12 IU/L (ref 0–44)
AST: 16 IU/L (ref 0–40)
Albumin: 4.3 g/dL (ref 3.8–4.8)
Alkaline Phosphatase: 119 IU/L (ref 47–123)
Bilirubin Total: 0.4 mg/dL (ref 0.0–1.2)
Bilirubin, Direct: 0.17 mg/dL (ref 0.00–0.40)
Total Protein: 6.9 g/dL (ref 6.0–8.5)

## 2024-04-09 NOTE — Progress Notes (Signed)
 HPI: FU CAD. Cardiac catheterization 6/10 revealed normal LV function. There was significant coronary disease and he had PCI of his posterior lateral and right coronary artery. He also had PCI of his PDA. Note all of these were drug-eluting stents. Nuclear study November 2016 showed ejection fraction 56%. There were ECG changes but perfusion normal. Abd ultrasound 12/17 showed no aneurysm.  Chest CT 12/24 showed pulmonary fibrosis.  TEE April 2025 showed normal LV function, no left atrial appendage thrombus.  Patient had cardioversion of atrial flutter at that time.  Had atrial flutter ablation August 2025.  Carotid Dopplers August 2025 showed 1 to 39% bilateral stenosis.  Since last seen, patient denies increased dyspnea, chest pain, palpitations or syncope.  Current Outpatient Medications  Medication Sig Dispense Refill   amLODipine  (NORVASC ) 5 MG tablet Take 5 mg by mouth daily.     apixaban  (ELIQUIS ) 5 MG TABS tablet Take 1 tablet (5 mg total) by mouth 2 (two) times daily. 60 tablet 10   aspirin  EC 81 MG tablet Take 81 mg by mouth daily. Swallow whole.     atorvastatin  (LIPITOR) 80 MG tablet Take 1 tablet (80 mg total) by mouth daily. 90 tablet 3   fenofibrate  160 MG tablet Take 160 mg by mouth daily.      metoprolol  succinate (TOPROL -XL) 50 MG 24 hr tablet Take 50 mg by mouth daily.     MOUNJARO 12.5 MG/0.5ML Pen Inject 12.5 mg into the skin once a week. Inject on Sunday     omeprazole (PRILOSEC) 40 MG capsule Take 40 mg by mouth daily.     Pirfenidone  (ESBRIET ) 267 MG CAPS Take 801 mg by mouth in the morning, at noon, and at bedtime. 810 capsule 1   predniSONE  (DELTASONE ) 5 MG tablet Take Monday, Wednesday and friday 60 tablet 1   VASCEPA  1 g capsule Take 2 capsules (2 g total) by mouth 2 (two) times daily. 120 capsule 11   venlafaxine  XR (EFFEXOR -XR) 75 MG 24 hr capsule Take 75 mg by mouth at bedtime.     Nintedanib (OFEV ) 150 MG CAPS Take 300 mg by mouth 2 (two) times daily. (Patient  not taking: Reported on 04/23/2024)     No current facility-administered medications for this visit.     Past Medical History:  Diagnosis Date   CAD (coronary artery disease)    Depression    Diabetes mellitus (HCC)    Borderline   Encounter for screening for malignant neoplasm of prostate    HTN (hypertension)    Hyperlipidemia    Non morbid obesity due to excess calories    Paroxysmal atrial fibrillation (HCC)    Renal insufficiency    Rhinitis, allergic     Past Surgical History:  Procedure Laterality Date   A-FLUTTER ABLATION N/A 12/29/2023   Procedure: A-FLUTTER ABLATION;  Surgeon: Kennyth Chew, MD;  Location: [facility] INVASIVE CV LAB;  Service: Cardiovascular;  Laterality: N/A;   APPENDECTOMY     CARDIOVERSION N/A 09/12/2023   Procedure: CARDIOVERSION;  Surgeon: Raford Riggs, MD;  Location: [facility] INVASIVE CV LAB;  Service: Cardiovascular;  Laterality: N/A;   COLONOSCOPY WITH ESOPHAGOGASTRODUODENOSCOPY (EGD)     LEFT HEART CATH  2006   Stent x3   TRANSESOPHAGEAL ECHOCARDIOGRAM (CATH LAB) N/A 09/12/2023   Procedure: TRANSESOPHAGEAL ECHOCARDIOGRAM;  Surgeon: Raford Riggs, MD;  Location: [facility] INVASIVE CV LAB;  Service: Cardiovascular;  Laterality: N/A;    Social History   Socioeconomic History   Marital status: Married  Spouse name: Not on file   Number of children: 3   Years of education: Not on file   Highest education level: Not on file  Occupational History    Comment: Retired  Tobacco Use   Smoking status: Former    Current packs/day: 0.00    Average packs/day: 1 pack/day for 43.0 years (43.0 ttl pk-yrs)    Types: Cigarettes    Start date: 76    Quit date: 05/16/2001    Years since quitting: 22.9   Smokeless tobacco: Never  Vaping Use   Vaping status: Never Used  Substance and Sexual Activity   Alcohol use: Yes    Alcohol/week: 0.0 standard drinks of alcohol    Comment: Occasional   Drug use: No   Sexual activity: Not on file  Other Topics Concern    Not on file  Social History Narrative    Pulmonary (05/27/16):   Originally from [place] [facility] [place]. Has always lived in [place]. 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 Does have exposure to chemicals from spraying. No mold exposure. Remote cockatiel exposure in a previous home. No hot tub exposure. Enjoys watching his grandchildren and hunting.    Social Drivers of Corporate Investment Banker Strain: Low Risk (12/23/2023)   Received from [facility]   Overall Financial Resource Strain (CARDIA)    How hard is it for you to pay for the very basics like food, housing, medical care, and heating?: Not hard at all  Food Insecurity: No Food Insecurity (12/23/2023)   Received from [facility]   Hunger Vital Sign    Within the past 12 months, you worried that your food would run out before you got the money to buy more.: Never true    Within the past 12 months, the food you bought just didn't last and you didn't have money to get more.: Never true  Transportation Needs: No Transportation Needs (12/23/2023)   Received from [facility] - Transportation    In the past 12 months, has lack of transportation kept you from medical appointments or from getting medications?: No    In the past 12 months, has lack of transportation kept you from meetings, work, or from getting things needed for daily living?: No  Physical Activity: Inactive (12/23/2023)   Received from [facility]   Exercise Vital Sign    On average, how many days per week do you engage in moderate to strenuous exercise (like a brisk walk)?: 0 days    Minutes of Exercise per Session: Not on file  Stress: No Stress Concern Present (12/23/2023)   Received from [facility] - Occupational Stress Questionnaire    Do you feel stress - tense, restless, nervous, or anxious, or unable to sleep at night because your mind is troubled all the time - these days?: Not at all   Social Connections: Socially Integrated (12/23/2023)   Received from [facility]   Social Network    How would you rate your social network (family, work, friends)?: Good participation with social networks  Intimate Partner Violence: Not At Risk (12/23/2023)   Received from [facility]   HITS    Over the last 12 months how often did your partner physically hurt you?: Never    Over the last 12 months how often did your partner insult you or talk down to you?: Never    Over the last 12 months how  often did your partner threaten you with physical harm?: Never    Over the last 12 months how often did your partner scream or curse at you?: Never    Family History  Problem Relation Age of Onset   Diabetes Brother    Heart disease Brother    Heart disease Father    Hypertension Brother    Stroke Mother    Stroke Brother    Lung disease Neg Hx    Rheumatologic disease Neg Hx     ROS: no fevers or chills, productive cough, hemoptysis, dysphasia, odynophagia, melena, hematochezia, dysuria, hematuria, rash, seizure activity, orthopnea, PND, pedal edema, claudication. Remaining systems are negative.  Physical Exam: Well-developed well-nourished in no acute distress.  Skin is warm and dry.  HEENT is normal.  Neck is supple.  Chest is clear to auscultation with normal expansion.  Cardiovascular exam is regular rate and rhythm.  Abdominal exam nontender or distended. No masses palpated. Extremities show no edema. neuro grossly intact   A/P  1 history of atrial flutter status post ablation-patient remains in sinus rhythm on exam.  Continue apixaban .  Risk of atrial arrhythmias significant in setting of underlying lung disease, obesity and history of sleep apnea.  2 coronary artery disease-he is not having chest pain.  Continue medical therapy with  statin.  Discontinue aspirin  as he is on apixaban .  3 hyperlipidemia-continue statin.  4 obstructive sleep apnea-continue CPAP.  5  obesity-we discussed continued efforts at weight loss.  6 interstitial lung disease-managed by pulmonary.  7 hypertension-patient's blood pressure is is low.  He has lost 60 pounds recently which is likely the cause.  Discontinue amlodipine  and follow.  Redell Shallow, MD

## 2024-04-23 ENCOUNTER — Encounter: Payer: Self-pay | Admitting: Cardiology

## 2024-04-23 ENCOUNTER — Ambulatory Visit: Attending: Cardiology | Admitting: Cardiology

## 2024-04-23 VITALS — BP 98/56 | HR 98 | Ht 62.0 in | Wt 167.0 lb

## 2024-04-23 DIAGNOSIS — I25118 Atherosclerotic heart disease of native coronary artery with other forms of angina pectoris: Secondary | ICD-10-CM

## 2024-04-23 DIAGNOSIS — I4892 Unspecified atrial flutter: Secondary | ICD-10-CM

## 2024-04-23 DIAGNOSIS — I10 Essential (primary) hypertension: Secondary | ICD-10-CM

## 2024-04-23 NOTE — Patient Instructions (Signed)
 Medication Instructions:   STOP ASPIRIN   STOP AMLODIPINE   *If you need a refill on your cardiac medications before your next appointment, please call your pharmacy*  Follow-Up: At [facility], you and your health needs are our priority.  As part of our continuing mission to provide you with exceptional heart care, our providers are all part of one team.  This team includes your primary Cardiologist (physician) and Advanced Practice Providers or APPs (Physician Assistants and Nurse Practitioners) who all work together to provide you with the care you need, when you need it.  Your next appointment:   6 month(s)  Provider:   Redell Shallow, MD

## 2024-04-24 ENCOUNTER — Encounter (HOSPITAL_BASED_OUTPATIENT_CLINIC_OR_DEPARTMENT_OTHER): Payer: Self-pay | Admitting: Pulmonary Disease

## 2024-05-13 ENCOUNTER — Ambulatory Visit (HOSPITAL_BASED_OUTPATIENT_CLINIC_OR_DEPARTMENT_OTHER)
Admission: RE | Admit: 2024-05-13 | Discharge: 2024-05-13 | Disposition: A | Discharge status: Home / Self Care | Source: Ambulatory Visit | Attending: Pulmonary Disease | Admitting: Pulmonary Disease

## 2024-05-13 ENCOUNTER — Ambulatory Visit: Payer: Self-pay | Admitting: Pulmonary Disease

## 2024-05-13 DIAGNOSIS — J849 Interstitial pulmonary disease, unspecified: Secondary | ICD-10-CM | POA: Insufficient documentation

## 2024-06-06 ENCOUNTER — Ambulatory Visit (HOSPITAL_BASED_OUTPATIENT_CLINIC_OR_DEPARTMENT_OTHER): Admitting: Pulmonary Disease

## 2024-06-06 ENCOUNTER — Encounter (HOSPITAL_BASED_OUTPATIENT_CLINIC_OR_DEPARTMENT_OTHER): Payer: Self-pay | Admitting: Pulmonary Disease

## 2024-06-06 VITALS — BP 106/65 | HR 99 | Ht 62.0 in | Wt 171.0 lb

## 2024-06-06 DIAGNOSIS — J9611 Chronic respiratory failure with hypoxia: Secondary | ICD-10-CM

## 2024-06-06 DIAGNOSIS — J849 Interstitial pulmonary disease, unspecified: Secondary | ICD-10-CM

## 2024-06-06 DIAGNOSIS — I712 Thoracic aortic aneurysm, without rupture, unspecified: Secondary | ICD-10-CM | POA: Diagnosis not present

## 2024-06-06 DIAGNOSIS — G4733 Obstructive sleep apnea (adult) (pediatric): Secondary | ICD-10-CM | POA: Diagnosis not present

## 2024-06-06 LAB — HEPATIC FUNCTION PANEL
ALT: 16 IU/L (ref 0–44)
AST: 20 IU/L (ref 0–40)
Albumin: 4.4 g/dL (ref 3.8–4.8)
Alkaline Phosphatase: 139 IU/L — ABNORMAL HIGH (ref 47–123)
Bilirubin Total: 0.5 mg/dL (ref 0.0–1.2)
Bilirubin, Direct: 0.18 mg/dL (ref 0.00–0.40)
Total Protein: 7.2 g/dL (ref 6.0–8.5)

## 2024-06-06 NOTE — Patient Instructions (Signed)
" °  VISIT SUMMARY: During your follow-up visit, we reviewed your interstitial lung disease, obstructive sleep apnea, and thoracic aortic aneurysm. Your weight has stabilized, and your respiratory symptoms are well-managed. We discussed your current medications and treatment plans.  YOUR PLAN: -INTERSTITIAL LUNG DISEASE WITH CHRONIC RESPIRATORY FAILURE WITH HYPOXIA: This condition involves scarring of the lung tissue, leading to difficulty in breathing and low oxygen levels. Your CT scan shows that the scarring is stable compared to last year. You are currently taking prednisone  5 mg four days a week, which is helping to maintain stability. We discussed other medications, but decided against them due to potential side effects. We will continue to monitor your lung function and symptoms. Your liver function tests were checked today.  -OBSTRUCTIVE SLEEP APNEA: This condition causes breathing to stop and start during sleep. Your CPAP therapy is effective, and you are using it every night without any issues. Continue with your current CPAP therapy.  -THORACIC AORTIC ANEURYSM (ASCENDING AORTA): This is an enlargement of the upper part of the aorta, the major blood vessel that supplies blood to the body. Your CT scan shows a slight increase in size from 4 cm to 4.2 cm. No immediate intervention is needed unless it exceeds 5 cm. We will continue to monitor it with annual CT scans.  INSTRUCTIONS: Please continue with your current medications and therapies. Follow up with annual CT scans to monitor the size of your aortic aneurysm. We will keep an eye on your liver function and lung symptoms. If you experience any new symptoms or have concerns, please contact our office. Check LFTs today    Contains text generated by [employer].   "

## 2024-06-06 NOTE — Progress Notes (Signed)
 "  Subjective:    Patient ID: Gary Keller, male    DOB: March 20, 1951, 74 y.o.   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  Due to clinical progression and worsening hypoxia , OV 07/2022 >>empiric trial of prednisone  20 mg for 1 week followed by 10 mg daily.       PMH : AF p-ablation 12/2023    Discussed the use of AI scribe software for clinical note transcription with the patient, who gave verbal consent to proceed.  History of Present Illness Gary Keller is a 74 year old male with interstitial lung disease and chronic respiratory failure who presents for follow-up. He is accompanied by his daughter, [name].  His weight has stabilized since October. A recent CT scan reportedly showed scarring unchanged compared to last year, with prior worsening between 2022 and 2023. His respiratory symptoms have been stable.  He is taking prednisone  four days a week after resuming it following a two-week break due to difficulty walking. He is on pirfenidone  without significant side effects. Liver function was last checked at his prior visit.  He has an aortic aneurysm with last reported size of 4.2 cm. He previously underwent heart ablation and cardioversion for arrhythmia.  He has obstructive sleep apnea treated with CPAP and uses it nightly with good effect.    Significant tests/ events reviewed   Serology neg 06/2016 PFT   04/2022 >> FVC 54%, DLCO 9.4/47% 02/2021 >>  FVC 63%,  DLCO 12.8/64%   02/2020 >> stable, FVC 64% DLCO 78%   12/2018 >> FVC 66%, TLC 72%, DLCO 64% 02/2018 FVC 66%   05/2017 ratio 88, FVC 71%, DLCO 76%   01/06/17: FVC 2.29 L (67%) FEV1 2.03 L (81%) FEV1/FVC 0.88   DLCO corrected 75%   06/24/16: FVC 2.49 L (76%) FEV1 2.21 L (91%) FEV1/FVC 0.89  negative bronchodilator response TLC 3.77 L (69%)  DLCO uncorrected 66%   01/06/17:  Walked 412 meters / Baseline Sat 99% on RA / Nadir Sat 90% on RA @ end of test     PSG(06/07/16):  AHI 15.0 events/hour., nadir satn 78%   >> Optimal CPAP pressure 14 cm      HRCT chest 04/2024 stable fibrosis from 2024 HRCT 04/2023 slightly progressive from 05/03/2022>> fibrotic HP   HRCT 04/2022 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 No appreciable interval progression since 06/29/2021 CT. Mild progression since baseline 05/31/2016      HRCT 06/2021 unchanged fibrosis alternative HRCT 06/2020 UIP, unchanged compared to 2021, air trapping raises question of chronic hypersensitivity pneumonitis   HRCT 02/2019  c/w UIP , air trapping raises concern for HP, minimally worse compared to 2018   HRCT CHEST W/O 05/2016 UIP pattern.  Subcarinal and precarinal lymph nodes measuring up to 1.1 & 1.2 cm in short axis. 1.1 CM oblong nodule within left lower lobe      CT CHEST W/ 04/2016 : 9 mm left lower  lobe nodule. Diffuse interlobular septal thickening with architectural distortion inferiorly with honeycombing versus bronchiectasis and medial right lower lobe.     CT CHEST W/ 07/2007:. Irregular interstitial prominence in addition. Ill-defined cystic lucencies diffusely. Fracture of left 10th rib noted.   Review of Systems  neg for any significant sore throat, dysphagia, itching, sneezing, nasal congestion or excess/ purulent secretions, fever, chills, sweats, unintended wt loss, pleuritic or exertional cp, hempoptysis, orthopnea pnd or change in chronic leg swelling. Also denies  presyncope, palpitations, heartburn, abdominal pain, nausea, vomiting, diarrhea or change in bowel or urinary habits, dysuria,hematuria, rash, arthralgias, visual complaints, headache, numbness weakness or ataxia.      Objective:   Physical Exam  Gen. Pleasant, well-nourished, in no distress ENT - no thrush, no pallor/icterus,no post nasal drip Neck: No JVD, no thyromegaly, no carotid bruits Lungs: no use of accessory muscles, no dullness to percussion, RT basal dry rales no rhonchi  Cardiovascular: Rhythm regular, heart sounds  normal, no murmurs or gallops, no peripheral edema Musculoskeletal: No deformities, no cyanosis or clubbing        Assessment & Plan:   Assessment and Plan Assessment & Plan Interstitial lung disease with chronic respiratory failure with hypoxia CT scan shows well-managed scarring compared to last year, with some worsening from 2022 to 2023. Lung function stabilized after initial drop. Symptoms well-managed, likely aided by weight loss. Currently on prednisone  5 mg four days a week, effective in maintaining stability. Discussed steroid-sparing agents like CellCept, but decided against due to potential immune suppression and current low prednisone  dose. Prednisone  side effects include weight gain, increased blood sugar, bone weakening, and potential glaucoma, but these are less likely at current dose. - Continue prednisone  5 mg four days a week. - Checked liver function tests today. - Continue monitoring lung function and symptoms.  Obstructive sleep apnea CPAP therapy is effective with excellent compliance and no missed nights. No issues reported with CPAP use. DL reviewed >>on auto 89-85 cm - Continue CPAP therapy.  Thoracic aortic aneurysm (ascending aorta) CT scan shows minimal enlargement of the ascending aorta from 4 cm to 4.2 cm. No immediate intervention required unless it exceeds 5 cm. Monitoring with annual scans is planned. - Continue annual CT scans  to monitor aortic size.     "

## 2024-06-07 ENCOUNTER — Ambulatory Visit: Payer: Self-pay | Admitting: Pulmonary Disease

## 2024-10-03 ENCOUNTER — Ambulatory Visit (HOSPITAL_BASED_OUTPATIENT_CLINIC_OR_DEPARTMENT_OTHER): Admitting: Pulmonary Disease
